# Patient Record
Sex: Male | Born: 1970 | Race: White | Hispanic: No | Smoking: Never smoker
Health system: Southern US, Community
[De-identification: ages and names within clinical notes are randomized; demographics above are authoritative.]

## PROBLEM LIST (undated history)

## (undated) DIAGNOSIS — G35D Multiple sclerosis, unspecified: Secondary | ICD-10-CM

## (undated) DIAGNOSIS — F419 Anxiety disorder, unspecified: Secondary | ICD-10-CM

## (undated) DIAGNOSIS — I10 Essential (primary) hypertension: Secondary | ICD-10-CM

## (undated) DIAGNOSIS — G35 Multiple sclerosis: Secondary | ICD-10-CM

## (undated) DIAGNOSIS — E119 Type 2 diabetes mellitus without complications: Secondary | ICD-10-CM

## (undated) HISTORY — PX: TOE AMPUTATION: SHX809

## (undated) HISTORY — DX: Anxiety disorder, unspecified: F41.9

## (undated) HISTORY — PX: CATARACT EXTRACTION, BILATERAL: SHX1313

---

## 2006-07-17 ENCOUNTER — Encounter (HOSPITAL_BASED_OUTPATIENT_CLINIC_OR_DEPARTMENT_OTHER): Admission: RE | Admit: 2006-07-17 | Discharge: 2006-10-15 | Payer: Self-pay | Admitting: Internal Medicine

## 2007-06-25 ENCOUNTER — Ambulatory Visit: Payer: Self-pay | Admitting: Internal Medicine

## 2007-06-25 ENCOUNTER — Encounter: Payer: Self-pay | Admitting: Internal Medicine

## 2007-07-06 ENCOUNTER — Inpatient Hospital Stay: Payer: Self-pay | Admitting: Endocrinology

## 2007-07-09 ENCOUNTER — Other Ambulatory Visit: Payer: Self-pay

## 2007-07-21 ENCOUNTER — Ambulatory Visit: Payer: Self-pay | Admitting: Podiatry

## 2007-07-26 ENCOUNTER — Encounter: Payer: Self-pay | Admitting: Internal Medicine

## 2009-01-15 IMAGING — CR DG FOOT 2V*L*
1 series · 2 of 2 positions shown · non-contrast
Comparison: none

REASON FOR EXAM: foot ulcer fax [phone]
COMMENTS:

PROCEDURE:     DXR - DXR FOOT LEFT AP AND LATERAL  - June 25, 2007  [DATE]
RESULT:     Images of the LEFT foot demonstrate degenerative spurring at the
Achilles and plantar regions of the calcaneus. No definite bony destruction
is seen.

[Series 1: view not recorded · 0.17mm/px · 2 of 2 slices shown]
[im 1/2]
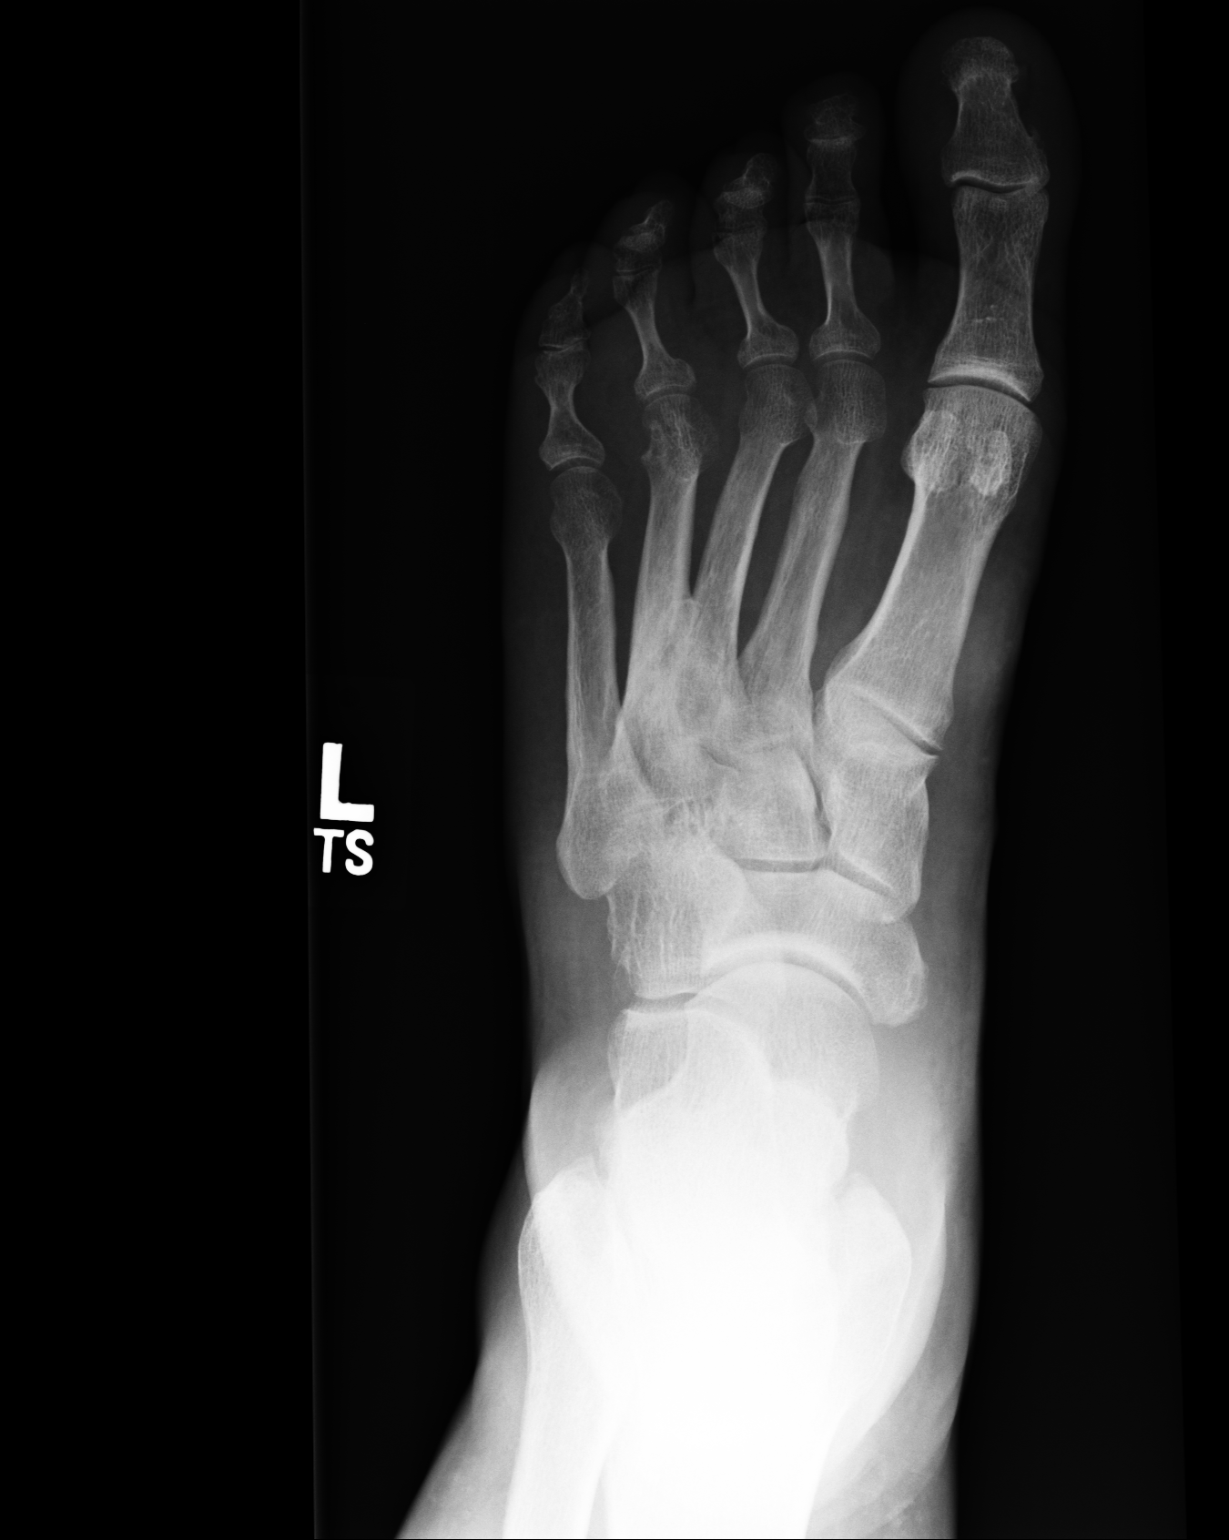
[im 2/2]
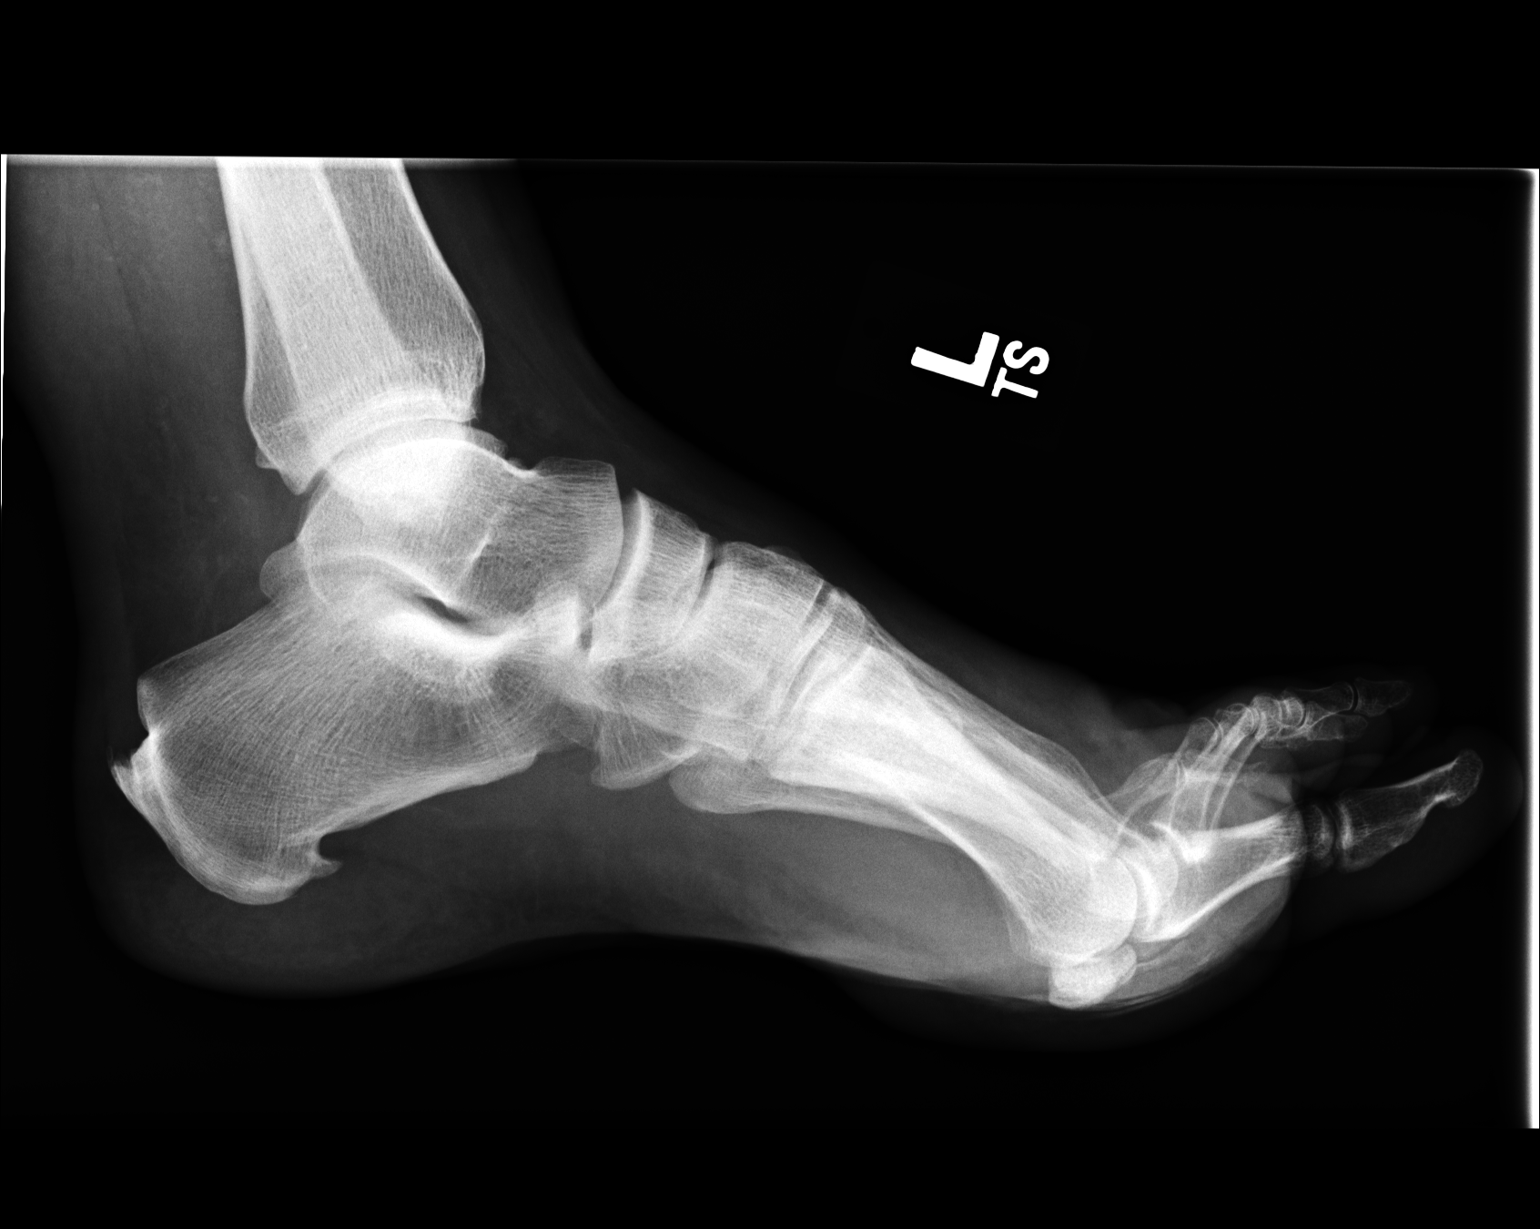

[2 of 2 positions shown; findings below may reference images not displayed]

IMPRESSION: No acute abnormality.

## 2013-06-15 ENCOUNTER — Emergency Department: Payer: Self-pay | Admitting: Emergency Medicine

## 2013-08-18 ENCOUNTER — Ambulatory Visit: Payer: Self-pay | Admitting: Internal Medicine

## 2013-08-21 ENCOUNTER — Inpatient Hospital Stay: Payer: Self-pay | Admitting: Internal Medicine

## 2013-08-21 LAB — URINALYSIS, COMPLETE
Bacteria: NONE SEEN
Bilirubin,UR: NEGATIVE
Hyaline Cast: 34
Leukocyte Esterase: NEGATIVE
Ph: 5 (ref 4.5–8.0)
Protein: 30
RBC,UR: 2 /HPF (ref 0–5)
Specific Gravity: 1.02 (ref 1.003–1.030)

## 2013-08-21 LAB — CBC
HCT: 37 % — ABNORMAL LOW (ref 40.0–52.0)
MCH: 29.2 pg (ref 26.0–34.0)
MCV: 87 fL (ref 80–100)
Platelet: 369 10*3/uL (ref 150–440)
WBC: 13.7 10*3/uL — ABNORMAL HIGH (ref 3.8–10.6)

## 2013-08-21 LAB — COMPREHENSIVE METABOLIC PANEL
Alkaline Phosphatase: 83 U/L
Anion Gap: 4 — ABNORMAL LOW (ref 7–16)
BUN: 32 mg/dL — ABNORMAL HIGH (ref 7–18)
Calcium, Total: 9.5 mg/dL (ref 8.5–10.1)
Chloride: 98 mmol/L (ref 98–107)
Co2: 29 mmol/L (ref 21–32)
Creatinine: 1.87 mg/dL — ABNORMAL HIGH (ref 0.60–1.30)
EGFR (African American): 50 — ABNORMAL LOW
EGFR (Non-African Amer.): 43 — ABNORMAL LOW
Glucose: 236 mg/dL — ABNORMAL HIGH (ref 65–99)
Osmolality: 277 (ref 275–301)
Potassium: 4.5 mmol/L (ref 3.5–5.1)
SGOT(AST): 21 U/L (ref 15–37)
SGPT (ALT): 29 U/L (ref 12–78)
Sodium: 131 mmol/L — ABNORMAL LOW (ref 136–145)

## 2013-08-21 LAB — SEDIMENTATION RATE: Erythrocyte Sed Rate: 59 mm/hr — ABNORMAL HIGH (ref 0–15)

## 2013-08-22 ENCOUNTER — Ambulatory Visit: Payer: Self-pay

## 2013-08-22 LAB — BASIC METABOLIC PANEL
Anion Gap: 4 — ABNORMAL LOW (ref 7–16)
BUN: 31 mg/dL — ABNORMAL HIGH (ref 7–18)
Calcium, Total: 9.3 mg/dL (ref 8.5–10.1)
Chloride: 99 mmol/L (ref 98–107)
Co2: 28 mmol/L (ref 21–32)
Creatinine: 1.52 mg/dL — ABNORMAL HIGH (ref 0.60–1.30)
EGFR (Non-African Amer.): 55 — ABNORMAL LOW
Glucose: 131 mg/dL — ABNORMAL HIGH (ref 65–99)
Osmolality: 271 (ref 275–301)
Sodium: 131 mmol/L — ABNORMAL LOW (ref 136–145)

## 2013-08-23 LAB — CREATININE, SERUM
Creatinine: 1.5 mg/dL — ABNORMAL HIGH (ref 0.60–1.30)
EGFR (African American): >60

## 2013-12-21 ENCOUNTER — Ambulatory Visit: Payer: Self-pay | Admitting: Ophthalmology

## 2014-03-16 ENCOUNTER — Ambulatory Visit: Payer: Self-pay | Admitting: Ophthalmology

## 2014-05-15 ENCOUNTER — Emergency Department: Payer: Self-pay | Admitting: Emergency Medicine

## 2015-01-14 NOTE — Consult Note (Signed)
Brief Consult Note: Diagnosis: bilateral venous insufficiency with LLE ULlcer.   Comments: Bilateral lower extremity edema with several day history of left leg ulceration.  Admitted with cellulitis, now on IV ABx.  Recomend continuing IV ABx and keeping legs elevated. No role for I&D at thsi time.  Pt will need a layered compression wrap at discharge [facility]), to be changed 3x/week by homehealth. He will also need an outpatient workup for venous insufficiency.  Electronic Signatures: Nada Libman (MD)  (Signed [phone] 10:27)  Authored: Brief Consult Note   Last Updated: 29-Nov-14 10:27 by Nada Libman (MD)

## 2015-01-14 NOTE — Consult Note (Signed)
Brief Consult Note: Diagnosis: bilateral chronic venous insufficiency with cellulitis, and draining abscess on right.   Patient was seen by consultant.   Comments: 84 yom w/ MO, HTN, T2DM, CRI (Cr 1.9), Dyslipidemia, and h/o L 4th toe amp 06/2007 for osteomyelitis, who has no osteomyelitis by plain films, and no DVT on R by duplex 3 days ago. WBC 14. Legs usually more swollen according to wife. 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 He needs broad spectrum IV ABx, BLE elevation (such that his ankles are at the level of his chin), and diuresis (as his CRI will allow) with only bathroom privileges. No surgical drainage is required. I have discussed the case with Dr Lorretta Harp from Vascular Surgery who will see him tomorrow and take over from a surgical consultant standpoint; therefore I have not added him as a consult on my list.  Electronic Signatures: Claude Manges (MD)  (Signed [phone] 17:38)  Authored: Brief Consult Note   Last Updated: 28-Nov-14 17:38 by Claude Manges (MD)

## 2015-01-14 NOTE — Consult Note (Signed)
PATIENT NAME:  [name], [name] MR#:  [id number] DATE OF BIRTH:  04/26/1971  DATE OF CONSULTATION:  08/22/2013  CONSULTING PHYSICIAN:  Nada Libman, MD  PRIMARY CARE PHYSICIAN:  Dr. Marguerite Olea   CARDIOLOGIST: Dr. Gwen Pounds  DATE OF ADMISSION:  08/21/2013  REASON FOR CONSULTATION:  Lower extremity edema and abscess.  HISTORY OF PRESENT ILLNESS:   This is a 44 year old gentleman who describes having chronic bilateral lower extremity edema.  His edema is exacerbated by prolonged periods of standing.  Within the past few days, he has developed drainage and open wounds.  These were initially treated with a compression wrap.  He presented to the Emergency Department with drainage and redness.  He was admitted for systemic inflammatory response.  He has recently undergone an ultrasound of the lower extremity which was negative for DVT.  He has received IV vancomycin and Augmentin.    The patient is medically managed for hypercholesterolemia with a statin.  He is a type 2 diabetic which is treated with insulin.  His blood sugars currently are under better control but they have been in the 300 range in the recent past.  He also suffers from chronic renal insufficiency.  His hypertension is managed with an ACE inhibitor.  FAMILY HISTORY:  Positive for coronary artery disease in his mother.  Positive for diabetes in his mother and father.  Also positive for hypertension.    SOCIAL HISTORY:  The patient lives at home.  He is a nonsmoker.    Review of Systems: see admission H`P, no changes  PHYSICAL EXAMINATION: VITAL SIGNS: On admission:  Afebrile.  Heart rate 92, blood pressure 116/72, O2 sats are 98% on room air.   HEENT:  Pupils are equal and round.  Normocephalic, atraumatic. LUNGS:  Nonlabored respirations. CARDIOVASCULAR: Regular rate and rhythm.  Palpable dorsalis pedis pulses bilaterally.  1+ edema in both lower extremities.   ABDOMEN:  Soft and nontender.   NEUROLOGIC:  Grossly intact.    Psych:  alert and oriented, normal affect Skin: Bilateral edema up to the knees.  He has hyperpigmentation of bilateral medial calves.  There are 2 open areas on the right lower extremity with drainage.   PSYCHIATRIC:  The patient is appropriate, alert and oriented.    LABORATORY DATA:  White blood cell count is 13.7, creatinine is 1.8.  ASSESSMENT:  Right lower extremity cellulitis with bilateral edema.  PLAN:  The patient needs to continue IV antibiotics and leg elevation to help with his swelling.  Once the erythema improves and the patient is stable for discharge, I would recommend a layered compression wrap to the right leg Roland Rack boot).  This will need to be changed at least 3 times a week by home health nurses.  As an outpatient, the patient can undergo a venous insufficiency workup.  He most likely has venous insufficiency given to varicosities on the anterior thigh.  If he does have superficial reflux, he may benefit from correction of his axial reflux.  I stress the importance of proper management of his diabetes as this is likely the main culprit for all of his health issues.  I also discussed the importance of compression stockings.  These will need to be worn most likely lifelong.  We will continue to follow the patient while he remains in the hospital.       ____________________________ Nada Libman, MD vwb:dp D: 08/22/2013 10:53:42 ET T: 08/22/2013 11:20:16 ET JOB#: [id number]  cc: Nada Libman, MD, <Dictator> [facility]  Janae Bridgeman MD ELECTRONICALLY SIGNED 08/23/2013 8:38

## 2015-01-14 NOTE — Discharge Summary (Signed)
PATIENT NAME:  [name], [name] MR#:  [id number] DATE OF BIRTH:  12-Sep-1971  DATE OF ADMISSION:  08/21/2013 DATE OF DISCHARGE:  08/23/2013  ADMISSION/DISCHARGE DIAGNOSIS:  1.  MRSA Right leg cellulitis with ulcer.  2.  History of diabetes.  3.  Chronic kidney disease.    CONSULTATIONS: Vascular surgery.   PERTINENT LABORATORY AT DISCHARGE: Sodium 131, potassium 4.6, chloride 99, bicarb 28, BUN 31, creatinine 1.52, glucose is 131. Wound culture is staph aureus.   HOSPITAL COURSE: This is a 44 year old male with diabetes and chronic venous stasis who presented with cellulitis of the leg with an ulcer. For further details, please refer to the H and P.  1.  Right lower extremity cellulitis with an ulcer. The patient was seen by vascular surgery. There is no indication for an I and D at this time. Cultures were growing out MRSA. The patient was initially on vancomycin and Augmentin and this was changed to clindamycin at discharge. Vascular surgery recommended home with home health care and an Radio broadcast assistant. The patient will need daily dressing changes with Vaseline gauze and Kerlix.  2.  Diabetes. The patient was continued on outpatient medications.  3.  Hypertension. The patient was continued on lisinopril/HCTZ. His blood pressure was controlled.  4.  Hyperlipidemia. On a statin.  5.  Chronic kidney disease stage 2.  His creatinine remained stable at 1.5.   DISCHARGE MEDICATIONS:  1.  Clindamycin 300 mg p.o. q.8 hours x 12 days.  2.  Glipizide 5 mg b.i.d.  3.  Novolin 70/30, 35 units in a.m. and 35 units at night.  4.  HCTZ/lisinopril 25/20 daily.  5.  Simvastatin 20 mg daily.  6.  Percocet 5/325 q.6 hours p.r.n. pain, #30.   DISCHARGE HOME HEALTH: With nurse and nurse aide with Roland Rack boot and wound dressing changes.   DISCHARGE DIET: Low sodium, ADA diet.   DISCHARGE ACTIVITY: As tolerated.   DISCHARGE FOLLOWUP: The patient will follow up with Dr. Gilda Crease, [facility],  in 1 week.   TIME SPENT: 35 minutes.   The patient is medically stable for discharge.   ____________________________ Shalice Woodring P. Juliene Pina, MD spm:cs D: 08/23/2013 13:39:29 ET T: 08/23/2013 20:03:19 ET JOB#: [id number]  cc: Shiara Mcgough P. Juliene Pina, MD, <Dictator> Renford Dills, MD Janyth Contes Laurann Mcmorris MD ELECTRONICALLY SIGNED 08/24/2013 12:51

## 2015-01-14 NOTE — H&P (Signed)
PATIENT NAME:  [name], [name] MR#:  [id number] DATE OF BIRTH:  06/04/1971  DATE OF ADMISSION:  08/21/2013  PRIMARY CARE PHYSICIAN:  Dr. Marguerite Olea.   CARDIOLOGIST:  Dr. Gwen Pounds.  PRESENTING COMPLAINT: Right lower extremity cellulitis with draining small abscesses.   Mr. Lammert is an obese 44 year old Caucasian gentleman with history of diabetes, hypertension, hyperlipidemia, CKD stage III, who has chronic lower extremity edema 1 to 2+ with chronic venous insufficiency, comes to the Emergency Room after he was noted to have right tibial shin lesions, which were draining pus for the last couple of days. The patient states the pain increased. He did not have any fever at home; however, he did say the lesions popped up and are draining puss. He underwent ultrasound of the Doppler lower extremity, which is negative for DVT and was seen by Dr. Anda Kraft in the Emergency Room, who has discussed with Dr. Gilda Crease to see the patient for possible underlying chronic venous insufficiency. The patient received a dose of IV vancomycin. We will add Augmentin to cover polymicrobial organisms in the setting of type 2 diabetes.   PAST MEDICAL HISTORY: 1.  Hypertension.  2.  Chronic hyperlipidemia.  3.  Chronic kidney disease. 4.  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   ALLERGIES: No known drug allergies.   MEDICATIONS: 1.  Simvastatin 20 mg daily.  2.  Lasix 40 mg daily.  3.  NovoLog 70/30, 35 units in the morning, 35 units in the evening.  4.  Protonix 40 mg daily.  5.  Hydrocodone/acetaminophen 5/325, 1 every 4 hours as needed.  6.  Lisinopril/hydrochlorothiazide 20/25, 1 tablet once a day.  7.  Glimepiride 2 mg 1 tablet daily.   FAMILY HISTORY:  Mother died of massive heart attack, had diabetes. Father had amputation due to diabetes, high blood pressure and heart issues.     SOCIAL HISTORY: Lives at home. Nonsmoker, nonalcoholic.   REVIEW OF SYSTEMS:   CONSTITUTIONAL: No fever. Positive for fatigue and no shortness of breath. Positive for pain in the right leg.  EYES: No blurred or double vision, glaucoma or cataracts.  EARS, NOSE, THROAT: No tinnitus, ear pain, hearing loss, difficulty swallowing.  RESPIRATORY: No cough, wheeze, hemoptysis, dyspnea.  CARDIOVASCULAR: No chest pain, orthopnea. Positive for hypertension. No arrhythmia.  GASTROINTESTINAL: No nausea, vomiting, diarrhea or hematemesis or GERD.  GENITOURINARY:  No dysuria, hematuria, frequency or incontinence.  ENDOCRINE: No polyuria, nocturia or thyroid problems.  HEMATOLOGY: No anemia or easy bruising or bleeding.  SKIN:  The patient does have chronic venous stasis changes with dark purplish hue, changes in both the lower extremities on the tibia shins, along with on the right lower extremity has 3 lesions that have burst, open, small abscesses draining pus.  MUSCULOSKELETAL: Positive for arthritis. No swelling or gout.  NEUROLOGIC: No CVA, TIA, dementia, headache or memory loss.  PSYCHIATRIC: No anxiety, depression or bipolar disorder. All other systems reviewed are negative.   PHYSICAL EXAMINATION: GENERAL: The patient is awake, alert, oriented x 3, not in acute distress.  VITAL SIGNS: Afebrile. Pulse is 92. Blood pressure is 116/72. Sats are 98% on room air.  HEENT: Atraumatic, normocephalic. Pupils: PERRLA. EOM intact. Oral mucosa is moist.  NECK: Supple. No JVD. No carotid bruit.  LUNGS: Clear to auscultation bilaterally. No rales, rhonchi, respiratory distress or labored breathing.  CARDIOVASCULAR: Both heart sounds are normal. Rate, rhythm regular. PMI not lateralized. Chest is nontender.  Good pedal pulses,  good femoral pulses.   ABDOMEN: Soft, benign, nontender. No organomegaly. Positive bowel sounds.  NEUROLOGIC: Grossly intact cranial nerves II through XII. No motor or sensory deficit.  EXTREMITIES: The patient has bilateral venous insufficiency with  hyperpigmentation. Right lower extremity: He has 3 lesions which are draining pus. There is some minimal cellulitis present, however, more of findings are suggestive of chronic venous insufficiency with dark discoloration in the lower extremities. The patient does have some edema which is chronic. Good pedal pulses, good femoral pulses. He has pitting edema 1 to 2+ in both the lower extremities.  NEUROLOGIC: Grossly intact cranial nerves II through XII.  No motor or sensory deficit.  PSYCHIATRIC: The patient is awake, alert, oriented x 3.   Lactic acid 1.3. TIBIA-FIBULA: No radiographic evidence of osteomyelitis. Glucose is 236. BUN is 32. Creatinine is 1.8. Sodium is 131. Albumin is 3.2. White count is 13.7. H and H is 12.4 and 37.0.   Ultrasound of the Doppler lower extremity is negative for any DVT.     A 44 year old [name] comes in with increasing pain and swelling along with discharge from the lesions in his right lower extremity. He is being admitted with:  1.  Systemic inflammatory response syndrome secondary to right lower extremity cellulitis with some possible venous ulcers secondary to suspected chronic venous insufficiency given chronic leg edema in both lower extremities and hyperpigmentation/discoloration, which appears chronic. We will admit the patient on to surgical floor. The patient was seen by Dr. Anda Kraft,  recommends IV antibiotics and followup with vascular. He has informed Dr. Gilda Crease of the patient being admitted. We will continue IV vancomycin and add p.o. Augmentin for broad-spectrum coverage. Send wound cultures. Follow blood cultures.  2.  Type 2 diabetes. We will place the patient on sliding scale insulin and continue his insulin 70/30.  3.  Hypertension. Continue hydrochlorothiazide, lisinopril.  4.  Hyperlipidemia, on simvastatin.  5.  Morbid obesity.  6.  Deep vein thrombosis prophylaxis with subQ heparin.   Further workup will depend on the patient's clinical  course. Hospital admission plan was discussed with the patient. No family members present in the Emergency Room.   TIME SPENT: 50 minutes.     ____________________________ Wylie Hail Allena Katz, MD sap:dmm D: 08/21/2013 18:24:00 ET T: 08/21/2013 19:46:31 ET JOB#: [id number]  cc: Rehan Holness A. Allena Katz, MD, <Dictator> [facility]. Marguerite Olea, MD Willow Ora MD ELECTRONICALLY SIGNED 09/04/2013 11:06

## 2015-04-06 ENCOUNTER — Other Ambulatory Visit: Payer: Self-pay | Admitting: Nurse Practitioner

## 2015-04-06 DIAGNOSIS — M545 Low back pain, unspecified: Secondary | ICD-10-CM

## 2015-04-06 DIAGNOSIS — Z6841 Body Mass Index (BMI) 40.0 and over, adult: Secondary | ICD-10-CM

## 2015-04-13 ENCOUNTER — Ambulatory Visit: Admission: RE | Admit: 2015-04-13 | Payer: BLUE CROSS/BLUE SHIELD | Source: Ambulatory Visit

## 2015-06-29 ENCOUNTER — Other Ambulatory Visit: Payer: Self-pay | Admitting: Nurse Practitioner

## 2015-06-29 ENCOUNTER — Ambulatory Visit
Admission: RE | Admit: 2015-06-29 | Discharge: 2015-06-29 | Disposition: A | Discharge status: Home / Self Care | Payer: BLUE CROSS/BLUE SHIELD | Source: Ambulatory Visit | Attending: Nurse Practitioner | Admitting: Nurse Practitioner

## 2015-06-29 DIAGNOSIS — M7989 Other specified soft tissue disorders: Secondary | ICD-10-CM

## 2015-06-29 DIAGNOSIS — M79662 Pain in left lower leg: Secondary | ICD-10-CM

## 2015-06-29 DIAGNOSIS — R590 Localized enlarged lymph nodes: Secondary | ICD-10-CM | POA: Insufficient documentation

## 2016-07-19 ENCOUNTER — Encounter: Payer: BLUE CROSS/BLUE SHIELD | Attending: Surgery | Admitting: Surgery

## 2016-07-19 ENCOUNTER — Ambulatory Visit
Admission: RE | Admit: 2016-07-19 | Discharge: 2016-07-19 | Disposition: A | Discharge status: Home / Self Care | Payer: BLUE CROSS/BLUE SHIELD | Source: Ambulatory Visit | Attending: Surgery | Admitting: Surgery

## 2016-07-19 ENCOUNTER — Other Ambulatory Visit: Payer: Self-pay | Admitting: Surgery

## 2016-07-19 DIAGNOSIS — F419 Anxiety disorder, unspecified: Secondary | ICD-10-CM | POA: Insufficient documentation

## 2016-07-19 DIAGNOSIS — N183 Chronic kidney disease, stage 3 (moderate): Secondary | ICD-10-CM | POA: Diagnosis not present

## 2016-07-19 DIAGNOSIS — M7732 Calcaneal spur, left foot: Secondary | ICD-10-CM | POA: Insufficient documentation

## 2016-07-19 DIAGNOSIS — E119 Type 2 diabetes mellitus without complications: Secondary | ICD-10-CM | POA: Diagnosis not present

## 2016-07-19 DIAGNOSIS — L97522 Non-pressure chronic ulcer of other part of left foot with fat layer exposed: Secondary | ICD-10-CM | POA: Diagnosis not present

## 2016-07-19 DIAGNOSIS — E11621 Type 2 diabetes mellitus with foot ulcer: Secondary | ICD-10-CM | POA: Diagnosis not present

## 2016-07-19 DIAGNOSIS — S81802A Unspecified open wound, left lower leg, initial encounter: Secondary | ICD-10-CM

## 2016-07-19 DIAGNOSIS — E1142 Type 2 diabetes mellitus with diabetic polyneuropathy: Secondary | ICD-10-CM | POA: Insufficient documentation

## 2016-07-19 DIAGNOSIS — E1122 Type 2 diabetes mellitus with diabetic chronic kidney disease: Secondary | ICD-10-CM | POA: Insufficient documentation

## 2016-07-19 DIAGNOSIS — Z992 Dependence on renal dialysis: Secondary | ICD-10-CM | POA: Insufficient documentation

## 2016-07-19 DIAGNOSIS — I129 Hypertensive chronic kidney disease with stage 1 through stage 4 chronic kidney disease, or unspecified chronic kidney disease: Secondary | ICD-10-CM | POA: Insufficient documentation

## 2016-07-19 DIAGNOSIS — I87302 Chronic venous hypertension (idiopathic) without complications of left lower extremity: Secondary | ICD-10-CM | POA: Insufficient documentation

## 2016-07-19 DIAGNOSIS — E663 Overweight: Secondary | ICD-10-CM | POA: Insufficient documentation

## 2016-07-19 DIAGNOSIS — X58XXXA Exposure to other specified factors, initial encounter: Secondary | ICD-10-CM | POA: Insufficient documentation

## 2016-07-19 DIAGNOSIS — Z6841 Body Mass Index (BMI) 40.0 and over, adult: Secondary | ICD-10-CM | POA: Insufficient documentation

## 2016-07-19 DIAGNOSIS — M869 Osteomyelitis, unspecified: Secondary | ICD-10-CM

## 2016-07-19 DIAGNOSIS — E1165 Type 2 diabetes mellitus with hyperglycemia: Secondary | ICD-10-CM | POA: Diagnosis not present

## 2016-07-19 DIAGNOSIS — Z794 Long term (current) use of insulin: Secondary | ICD-10-CM | POA: Insufficient documentation

## 2016-07-19 DIAGNOSIS — Z89422 Acquired absence of other left toe(s): Secondary | ICD-10-CM | POA: Insufficient documentation

## 2016-07-20 NOTE — Progress Notes (Signed)
URIAH, PHILIPSON ([id number]) Visit Report for 07/19/2016 Chief Complaint Document Details Patient Name: [name], [name]. Date of Service: 07/19/2016 8:00 AM Medical Record Number: [id number] Patient Account Number: [id number] Date of Birth/Sex: [birth year] (45 y.o. Male) Treating RN: Curtis Sites Primary Care Physician: Polo Riley Other Clinician: Referring Physician: CLINE, TODD Treating Physician/Extender: [name] in Treatment: 0 Information Obtained from: Patient Chief Complaint Patients presents for treatment of an open diabetic ulcer the left plantar foot which she's had for about 2 months Electronic Signature(s) Signed: 07/19/2016 8:59:57 AM By: Evlyn Kanner MD, FACS Entered By: Evlyn Kanner on 07/19/2016 08:59:56 Limehouse, [name] ([id number]) -------------------------------------------------------------------------------- HPI Details Patient Name: [name]. Date of Service: 07/19/2016 8:00 AM Medical Record Number: [id number] Patient Account Number: [id number] Date of Birth/Sex: 18-Mar-1971 (45 y.o. Male) Treating RN: Curtis Sites Primary Care Physician: Polo Riley Other Clinician: Referring Physician: CLINE, TODD Treating Physician/Extender: [name] in Treatment: 0 History of Present Illness Location: left plantar foot Quality: Patient reports No Pain. Severity: Patient states wound (s) are getting better. Duration: Patient has had the wound for > 2 months prior to seeking treatment at the wound center Context: The wound would happen gradually Modifying Factors: Other treatment(s) tried include:in seeing a podiatrist who is debrided the wound and also put him on Augmentin HPI Description: 45 year old gentleman known to have diabetes mellitus cc Dr. Mickey Farber at the [facility] sees Dr. Liz Beach of podiatry for a left foot ulcer. what I understand he is not very compliant and he has hyperglycemia and is managed  with insulin.past medical history is significant for anxiety, back pain, chronic kidney disease stage III, ulcer, peripheral neuropathy, retinopathy, obesity, status post amputation of the left fourth toe,right vein stripping in January 2015. He has never been a smoker. Last hemoglobin A1c was 9.5% and repeat in August 2017 was 8.2% note he's had his left fourth toe amputated in 2009 and at that time also had received hyperbaric oxygen therapy at the wound clinic. He's had laser ablation of his right varicose vein in 2015 and this was done by Dr. Gilda Crease Electronic Signature(s) Signed: 07/19/2016 9:01:47 AM By: Evlyn Kanner MD, FACS Previous Signature: 07/19/2016 8:34:01 AM Version By: Evlyn Kanner MD, FACS Entered By: Evlyn Kanner on 07/19/2016 09:01:47 [name], [name] ([id number]) -------------------------------------------------------------------------------- Physical Exam Details Patient Name: [name]. Date of Service: 07/19/2016 8:00 AM Medical Record Number: [id number] Patient Account Number: [id number] Date of Birth/Sex: [birth year] (45 y.o. Male) Treating RN: Curtis Sites Primary Care Physician: Polo Riley Other Clinician: Referring Physician: CLINE, TODD Treating Physician/Extender: [name] in Treatment: 0 Constitutional . Pulse regular. Respirations normal and unlabored. Afebrile. . Eyes Nonicteric. Reactive to light. Ears, Nose, Mouth, and Throat Lips, teeth, and gums WNL.Marland Kitchen Moist mucosa without lesions. Neck supple and nontender. No palpable supraclavicular or cervical adenopathy. Normal sized without goiter. Respiratory WNL. No retractions.. Breath sounds WNL, No rubs, rales, rhonchi, or wheeze.. Cardiovascular Pedal Pulses WNL the left was 1.3. No clubbing, cyanosis or edema. Chest Breasts symmetical and no nipple discharge.. Breast tissue WNL, no masses, lumps, or tenderness.. Lymphatic No adneopathy. No adenopathy. No  adenopathy. Musculoskeletal Adexa without tenderness or enlargement.. Digits and nails w/o clubbing, cyanosis, infection, petechiae, ischemia, or inflammatory conditions.. Integumentary (Hair, Skin) No suspicious lesions. No crepitus or fluctuance. No peri-wound warmth or erythema. No masses.Marland Kitchen Psychiatric Judgement and insight Intact.. No evidence of depression, anxiety, or agitation.. Notes the patient has fourth toe amputation done in the past and  he has a prominent metatarsal head on the fifth toe and has a shallow ulcer with minimal callus and there is no evidence of surrounding cellulitis. No sharp debridement was required today. Electronic Signature(s) Signed: 07/19/2016 9:59:21 AM By: Evlyn Kanner MD, FACS Previous Signature: 07/19/2016 9:02:55 AM Version By: Evlyn Kanner MD, FACS Entered By: Evlyn Kanner on 07/19/2016 09:59:21 [name], [name] ([id number]) -------------------------------------------------------------------------------- Physician Orders Details Patient Name: [name]. Date of Service: 07/19/2016 8:00 AM Medical Record Number: [id number] Patient Account Number: [id number] Date of Birth/Sex: 09-May-1971 (45 y.o. Male) Treating RN: Clover Mealy, RN, BSN, [place] Primary Care Physician: Polo Riley Other Clinician: Referring Physician: CLINE, TODD Treating Physician/Extender: [name] in Treatment: 0 Verbal / Phone Orders: Yes Clinician: Afful, RN, BSN, Rita Read Back and Verified: Yes Diagnosis Coding Wound Cleansing Wound #1 Left,Plantar Metatarsal head fifth o Clean wound with Normal Saline. - In clinic o Cleanse wound with mild soap and water o May Shower, gently pat wound dry prior to applying new dressing. o May shower with protection. Primary Wound Dressing Wound #1 Left,Plantar Metatarsal head fifth o Prisma Ag Secondary Dressing Wound #1 Left,Plantar Metatarsal head fifth o Gauze and Kerlix/Conform Dressing Change  Frequency Wound #1 Left,Plantar Metatarsal head fifth o Change dressing every other day. Follow-up Appointments Wound #1 Left,Plantar Metatarsal head fifth o Return Appointment in: - On MONDAY FOR TCC APPLICATION Off-Loading Wound #1 Left,Plantar Metatarsal head fifth o Total Contact Cast to Left Lower Extremity - TO BE APPLIED ON MONDAY o Open toe surgical shoe to: - DARCO FRONT OFFLOADER FOR NOW!! Additional Orders / Instructions Wound #1 Left,Plantar Metatarsal head fifth o Increase protein intake. o Activity as tolerated o Other: - PROTEIN, VIT A, C, ZINC Radiology [name], PUSKAS. ([id number]) o X-ray, foot - 2 PLUS VIEW OF LEFT FOOT WITH FOCUS ON 5TH METATARSAL HEAD Notes GET NOTES FROM [facility] REGARDING PREVIOUS PROCEDURE Electronic Signature(s) Signed: 07/19/2016 11:57:46 AM By: Elpidio Eric BSN, RN Signed: 07/19/2016 4:19:07 PM By: Evlyn Kanner MD, FACS Entered By: Elpidio Eric on 07/19/2016 08:57:14 [name], [name] ([id number]) -------------------------------------------------------------------------------- Problem List Details Patient Name: [name]. Date of Service: 07/19/2016 8:00 AM Medical Record Number: [id number] Patient Account Number: [id number] Date of Birth/Sex: [birth year] (45 y.o. Male) Treating RN: Curtis Sites Primary Care Physician: Polo Riley Other Clinician: Referring Physician: CLINE, TODD Treating Physician/Extender: [name] in Treatment: 0 Active Problems ICD-10 Encounter Code Description Active Date Diagnosis E11.621 Type 2 diabetes mellitus with foot ulcer 07/19/2016 Yes L97.522 Non-pressure chronic ulcer of other part of left foot with fat 07/19/2016 Yes layer exposed E66.3 Overweight 07/19/2016 Yes I87.302 Chronic venous hypertension (idiopathic) without 07/19/2016 Yes complications of left lower extremity Inactive Problems Resolved Problems Electronic Signature(s) Signed: 07/19/2016 8:59:21 AM  By: Evlyn Kanner MD, FACS Entered By: Evlyn Kanner on 07/19/2016 08:59:20 [name], [name] ([id number]) -------------------------------------------------------------------------------- Progress Note Details Patient Name: [name]. Date of Service: 07/19/2016 8:00 AM Medical Record Number: [id number] Patient Account Number: [id number] Date of Birth/Sex: [birth year] (45 y.o. Male) Treating RN: Curtis Sites Primary Care Physician: Polo Riley Other Clinician: Referring Physician: CLINE, TODD Treating Physician/Extender: [name] in Treatment: 0 Subjective Chief Complaint Information obtained from Patient Patients presents for treatment of an open diabetic ulcer the left plantar foot which she's had for about 2 months History of Present Illness (HPI) The following HPI elements were documented for the patient's wound: Location: left plantar foot Quality: Patient reports No Pain. Severity: Patient states wound (s) are getting better.  Duration: Patient has had the wound for > 2 months prior to seeking treatment at the wound center Context: The wound would happen gradually Modifying Factors: Other treatment(s) tried include:in seeing a podiatrist who is debrided the wound and also put him on Augmentin 45 year old gentleman known to have diabetes mellitus cc [facility]so sees Dr. Liz Beachodd Klein of podiatry for a left foot ulcer. what I understand he is not very compliant and he has hyperglycemia and is managed with insulin.past medical history is significant for anxiety, back pain, chronic kidney disease stage III, ulcer, peripheral neuropathy, retinopathy, obesity, status post amputation of the left fourth toe,right vein stripping in January 2015. He has never been a smoker. Last hemoglobin A1c was 9.5% and repeat in August 2017 was 8.2% note he's had his left fourth toe amputated in 2009 and at that time also had received hyperbaric  oxygen therapy at the wound clinic. He's had laser ablation of his right varicose vein in 2015 and this was done by Dr. Gilda CreaseSchnier Wound History Patient presents with 1 open wound that has been present for approximately 6 months. Patient has been treating wound in the following manner: neosporin. Laboratory tests have been performed in the last month. Patient reportedly has not tested positive for an antibiotic resistant organism. Patient reportedly has tested positive for osteomyelitis. Patient reportedly has had testing performed to evaluate circulation in the legs. Patient History Information obtained from Patient. Allergies No Known Drug Allergies [name], [name] PouROBERT S. ([id number]) Social History Never smoker, Marital Status - Married, Alcohol Use - Never, Drug Use - No History, Caffeine Use - Daily. Medical History Eyes Patient has history of Cataracts - removed Cardiovascular Patient has history of Hypertension Gastrointestinal Denies history of Cirrhosis Endocrine Patient has history of Type II Diabetes Neurologic Patient has history of Neuropathy Patient is treated with Insulin. Blood sugar is tested. Medical And Surgical History Notes Cardiovascular venous insufficiency Review of Systems (ROS) Constitutional Symptoms (General Health) The patient has no complaints or symptoms. Eyes Complains or has symptoms of Glasses / Contacts - glasses. Ear/Nose/Mouth/Throat The patient has no complaints or symptoms. Hematologic/Lymphatic The patient has no complaints or symptoms. Respiratory The patient has no complaints or symptoms. Cardiovascular The patient has no complaints or symptoms. Gastrointestinal The patient has no complaints or symptoms. Endocrine The patient has no complaints or symptoms. Genitourinary Complains or has symptoms of Kidney failure/ Dialysis - CKD stage 3. Immunological The patient has no complaints or symptoms. Integumentary (Skin) The patient has no  complaints or symptoms. Musculoskeletal The patient has no complaints or symptoms. Oncologic The patient has no complaints or symptoms. Psychiatric The patient has no complaints or symptoms. [name] HaringREESE, [name] S. ([id number]) I have reviewed a list of medications and he takes paroxetine 10 mg a day, Lisinopril hydrochlorothiazide 10/12.5 one a day and 70/30 insulin 35 units in the morning and 50 units in the evening Objective Constitutional Pulse regular. Respirations normal and unlabored. Afebrile. Vitals Time Taken: 8:15 AM, Height: 71 in, Source: Stated, Weight: 298 lbs, Source: Measured, BMI: 41.6, Temperature: 97.5 F, Pulse: 83 bpm, Respiratory Rate: 16 breaths/min, Blood Pressure: 126/74 mmHg. Eyes Nonicteric. Reactive to light. Ears, Nose, Mouth, and Throat Lips, teeth, and gums WNL.Marland Kitchen. Moist mucosa without lesions. Neck supple and nontender. No palpable supraclavicular or cervical adenopathy. Normal sized without goiter. Respiratory WNL. No retractions.. Breath sounds WNL, No rubs, rales, rhonchi, or wheeze.. Cardiovascular Pedal Pulses WNL the left was 1.3. No clubbing, cyanosis or edema. Chest  Breasts symmetical and no nipple discharge.. Breast tissue WNL, no masses, lumps, or tenderness.. Lymphatic No adneopathy. No adenopathy. No adenopathy. Musculoskeletal Adexa without tenderness or enlargement.. Digits and nails w/o clubbing, cyanosis, infection, petechiae, ischemia, or inflammatory conditions.Marland Kitchen Psychiatric Judgement and insight Intact.. No evidence of depression, anxiety, or agitation.Marland Kitchen [name], [name] ([id number]) General Notes: the patient has fourth toe amputation done in the past and he has a prominent metatarsal head on the fifth toe and has a shallow ulcer with minimal callus and there is no evidence of surrounding cellulitis. No sharp debridement was required today. Integumentary (Hair, Skin) No suspicious lesions. No crepitus or fluctuance. No peri-wound  warmth or erythema. No masses.. Wound #1 status is Open. Original cause of wound was Gradually Appeared. The wound is located on the Left,Plantar Metatarsal head fifth. The wound measures 0.3cm length x 0.5cm width x 0.1cm depth; 0.118cm^2 area and 0.012cm^3 volume. The wound is limited to skin breakdown. There is no tunneling or undermining noted. There is a large amount of serous drainage noted. The wound margin is flat and intact. There is large (67-100%) red granulation within the wound bed. There is no necrotic tissue within the wound bed. The periwound skin appearance exhibited: Callus, Moist. The periwound skin appearance did not exhibit: Crepitus, Excoriation, Fluctuance, Friable, Induration, Localized Edema, Rash, Scarring, Dry/Scaly, Maceration, Atrophie Blanche, Cyanosis, Ecchymosis, Hemosiderin Staining, Mottled, Pallor, Rubor, Erythema. Periwound temperature was noted as No Abnormality. Assessment Active Problems ICD-10 E11.621 - Type 2 diabetes mellitus with foot ulcer L97.522 - Non-pressure chronic ulcer of other part of left foot with fat layer exposed E66.3 - Overweight I87.302 - Chronic venous hypertension (idiopathic) without complications of left lower extremity 45 year old diabetic who has hypoglycemia is trying his best to control his blood sugar appropriately. Has a Wagner stage I diabetic foot ulcer on the plantar aspect of his left foot and I have recommended: 1. Off loading with a total contact cast and he will be back on Monday to have his first application. 2. [employer] offloading felt and continue with his dark or front offloading shoe until the TCC is applied on Monday 3. Xray of the left foot 4. good control of his diabetes mellitus 5. also reviewed his notes from the vascular office 6. regular visits the wound center until resolution of his problem. he and his wife have read all questions answered. [name], [name] ([id number]) Plan Wound Cleansing: Wound  #1 Left,Plantar Metatarsal head fifth: Clean wound with Normal Saline. - In clinic Cleanse wound with mild soap and water May Shower, gently pat wound dry prior to applying new dressing. May shower with protection. Primary Wound Dressing: Wound #1 Left,Plantar Metatarsal head fifth: Prisma Ag Secondary Dressing: Wound #1 Left,Plantar Metatarsal head fifth: Gauze and Kerlix/Conform Dressing Change Frequency: Wound #1 Left,Plantar Metatarsal head fifth: Change dressing every other day. Follow-up Appointments: Wound #1 Left,Plantar Metatarsal head fifth: Return Appointment in: - On MONDAY FOR TCC APPLICATION Off-Loading: Wound #1 Left,Plantar Metatarsal head fifth: Total Contact Cast to Left Lower Extremity - TO BE APPLIED ON MONDAY Open toe surgical shoe to: - DARCO FRONT OFFLOADER FOR NOW!! Additional Orders / Instructions: Wound #1 Left,Plantar Metatarsal head fifth: Increase protein intake. Activity as tolerated Other: - PROTEIN, VIT A, C, ZINC Radiology ordered were: X-ray, foot - 2 PLUS VIEW OF LEFT FOOT WITH FOCUS ON 5TH METATARSAL HEAD General Notes: GET NOTES FROM [facility] REGARDING PREVIOUS PROCEDURE 45 year old diabetic who has hypoglycemia is trying his best to control his blood sugar  appropriately. Has a Wagner stage I diabetic foot ulcer on the plantar aspect of his left foot and I have recommended: 1. Off loading with a total contact cast and he will be back on Monday to have his first application. 2. [employer] offloading felt and continue with his dark or front offloading shoe until the TCC is applied on Monday 3. Xray of the left foot 4. good control of his diabetes mellitus 5. also review his notes from the vascular office 6. regular visits the wound center until resolution of his problem. YACINE, GARRIGA ([id number]) he and his wife have read all questions answered. Electronic Signature(s) Signed: 07/19/2016 10:00:13 AM By: Evlyn Kanner MD, FACS Previous  Signature: 07/19/2016 9:07:38 AM Version By: Evlyn Kanner MD, FACS Entered By: Evlyn Kanner on 07/19/2016 10:00:12 [name] ([id number]) -------------------------------------------------------------------------------- ROS/PFSH Details Patient Name: [name]. Date of Service: 07/19/2016 8:00 AM Medical Record Number: [id number] Patient Account Number: [id number] Date of Birth/Sex: Sep 05, 1971 (45 y.o. Male) Treating RN: Curtis Sites Primary Care Physician: Polo Riley Other Clinician: Referring Physician: CLINE, TODD Treating Physician/Extender: [name] in Treatment: 0 Information Obtained From Patient Wound History Do you currently have one or more open woundso Yes How many open wounds do you currently haveo 1 Approximately how long have you had your woundso 6 months How have you been treating your wound(s) until nowo neosporin Has your wound(s) ever healed and then [name]-openedo No Have you had any lab work done in the past montho Yes Who ordered the lab work doneo PCP Have you tested positive for an antibiotic resistant organism (MRSA, VRE)o No Have you tested positive for osteomyelitis (bone infection)o Yes Date: 09/25/2007 Have you had any tests for circulation on your legso Yes Who ordered the testo PCP Where was the test doneo [facility] Eyes Complaints and Symptoms: Positive for: Glasses / Contacts - glasses Medical History: Positive for: Cataracts - removed Genitourinary Complaints and Symptoms: Positive for: Kidney failure/ Dialysis - CKD stage 3 Constitutional Symptoms (General Health) Complaints and Symptoms: No Complaints or Symptoms Ear/Nose/Mouth/Throat Complaints and Symptoms: No Complaints or Symptoms Hematologic/Lymphatic AKSHAT, MINEHART ([id number]) Complaints and Symptoms: No Complaints or Symptoms Respiratory Complaints and Symptoms: No Complaints or Symptoms Cardiovascular Complaints and Symptoms: No Complaints or  Symptoms Medical History: Positive for: Hypertension Past Medical History Notes: venous insufficiency Gastrointestinal Complaints and Symptoms: No Complaints or Symptoms Medical History: Negative for: Cirrhosis Endocrine Complaints and Symptoms: No Complaints or Symptoms Medical History: Positive for: Type II Diabetes Time with diabetes: 15 years Treated with: Insulin Blood sugar tested every day: Yes Tested : Immunological Complaints and Symptoms: No Complaints or Symptoms Integumentary (Skin) Complaints and Symptoms: No Complaints or Symptoms Musculoskeletal Bisson, Jeremiah S. ([id number]) Complaints and Symptoms: No Complaints or Symptoms Neurologic Medical History: Positive for: Neuropathy Oncologic Complaints and Symptoms: No Complaints or Symptoms Psychiatric Complaints and Symptoms: No Complaints or Symptoms HBO Extended History Items Eyes: Cataracts Immunizations Pneumococcal Vaccine: Received Pneumococcal Vaccination: No Immunization Notes: up to dtae Family and Social History Never smoker; Marital Status - Married; Alcohol Use: Never; Drug Use: No History; Caffeine Use: Daily; Financial Concerns: No; Food, Clothing or Shelter Needs: No; Support System Lacking: No; Transportation Concerns: No; Advanced Directives: No; Patient does not want information on Advanced Directives Physician Affirmation I have reviewed and agree with the above information. Electronic Signature(s) Signed: 07/19/2016 4:19:07 PM By: Evlyn Kanner MD, FACS Signed: 07/19/2016 5:14:40 PM By: Curtis Sites Entered By: Evlyn Kanner on 07/19/2016 09:03:19 Odwyer, Molly Maduro  Kathie Rhodes ([id number]) -------------------------------------------------------------------------------- SuperBill Details Patient Name: THEORDORE, CISNERO. Date of Service: 07/19/2016 Medical Record Number: [id number] Patient Account Number: [id number] Date of Birth/Sex: 11-Aug-1971 (45 y.o. Male) Treating RN: Curtis Sites Primary Care Physician: Polo Riley Other Clinician: Referring Physician: CLINE, TODD Treating Physician/Extender: [name] in Treatment: 0 Diagnosis Coding ICD-10 Codes Code Description E11.621 Type 2 diabetes mellitus with foot ulcer L97.522 Non-pressure chronic ulcer of other part of left foot with fat layer exposed E66.3 Overweight I87.302 Chronic venous hypertension (idiopathic) without complications of left lower extremity Facility Procedures CPT4 Code: [id number] Description: 99214 - WOUND CARE VISIT-LEV 4 EST PT Modifier: Quantity: 1 Physician Procedures CPT4: Description Modifier Quantity Code [id number] - WC PHYS LEVEL 4 - NEW PT 1 ICD-10 Description Diagnosis E11.621 Type 2 diabetes mellitus with foot ulcer L97.522 Non-pressure chronic ulcer of other part of left foot with fat layer exposed E66.3  Overweight I87.302 Chronic venous hypertension (idiopathic) without complications of left lower extremity Electronic Signature(s) Signed: 07/19/2016 9:59:01 AM By: Evlyn Kanner MD, FACS Entered By: Evlyn Kanner on 07/19/2016 09:59:01

## 2016-07-20 NOTE — Progress Notes (Signed)
[name], [name] ([id number]) Visit Report for 07/19/2016 Abuse/Suicide Risk Screen Details Patient Name: [name], [name]. Date of Service: 07/19/2016 8:00 AM Medical Record Number: [id number] Patient Account Number: [id number] Date of Birth/Sex: 11-06-1970 (45 y.o. Male) Treating RN: Curtis Sites Primary Care Physician: Polo Riley Other Clinician: Referring Physician: CLINE, TODD Treating Physician/Extender: Rudene Re in Treatment: 0 Abuse/Suicide Risk Screen Items Answer ABUSE/SUICIDE RISK SCREEN: Has anyone close to you tried to hurt or harm you recentlyo No Do you feel uncomfortable with anyone in your familyo No Has anyone forced you do things that you didnot want to doo No Do you have any thoughts of harming yourselfo No Patient displays signs or symptoms of abuse and/or neglect. No Electronic Signature(s) Signed: 07/19/2016 5:14:40 PM By: Curtis Sites Entered By: Curtis Sites on 07/19/2016 08:29:39 [name] ([id number]) -------------------------------------------------------------------------------- Activities of Daily Living Details Patient Name: [name]. Date of Service: 07/19/2016 8:00 AM Medical Record Number: [id number] Patient Account Number: [id number] Date of Birth/Sex: 08/11/1971 (45 y.o. Male) Treating RN: Curtis Sites Primary Care Physician: Polo Riley Other Clinician: Referring Physician: CLINE, TODD Treating Physician/Extender: Rudene Re in Treatment: 0 Activities of Daily Living Items Answer Activities of Daily Living (Please select one for each item) Drive Automobile Completely Able Take Medications Completely Able Use Telephone Completely Able Care for Appearance Completely Able Use Toilet Completely Able Bath / Shower Completely Able Dress Self Completely Able Feed Self Completely Able Walk Completely Able Get In / Out Bed Completely Able Housework Completely Able Prepare Meals Completely  Able Handle Money Completely Able Shop for Self Completely Able Electronic Signature(s) Signed: 07/19/2016 5:14:40 PM By: Curtis Sites Entered By: Curtis Sites on 07/19/2016 08:29:55 [name] ([id number]) -------------------------------------------------------------------------------- Education Assessment Details Patient Name: [name]. Date of Service: 07/19/2016 8:00 AM Medical Record Number: [id number] Patient Account Number: [id number] Date of Birth/Sex: 1971-01-16 (45 y.o. Male) Treating RN: Curtis Sites Primary Care Physician: Polo Riley Other Clinician: Referring Physician: CLINE, TODD Treating Physician/Extender: Rudene Re in Treatment: 0 Primary Learner Assessed: Patient Learning Preferences/Education Level/Primary Language Learning Preference: Explanation, Demonstration Highest Education Level: College or Above Preferred Language: English Cognitive Barrier Assessment/Beliefs Language Barrier: No Translator Needed: No Memory Deficit: No Emotional Barrier: No Cultural/Religious Beliefs Affecting Medical No Care: Physical Barrier Assessment Impaired Vision: No Impaired Hearing: No Decreased Hand dexterity: No Knowledge/Comprehension Assessment Knowledge Level: Medium Comprehension Level: Medium Ability to understand written Medium instructions: Ability to understand verbal Medium instructions: Motivation Assessment Anxiety Level: Calm Cooperation: Cooperative Education Importance: Acknowledges Need Interest in Health Problems: Asks Questions Perception: Coherent Willingness to Engage in Self- Medium Management Activities: Readiness to Engage in Self- Medium Management Activities: Electronic Signature(s) [name] ([id number]) Signed: 07/19/2016 5:14:40 PM By: Curtis Sites Entered By: Curtis Sites on 07/19/2016 08:30:19 [name]  ([id number]) -------------------------------------------------------------------------------- Fall Risk Assessment Details Patient Name: [name]. Date of Service: 07/19/2016 8:00 AM Medical Record Number: [id number] Patient Account Number: [id number] Date of Birth/Sex: Nov 06, 1970 (45 y.o. Male) Treating RN: Curtis Sites Primary Care Physician: Polo Riley Other Clinician: Referring Physician: CLINE, TODD Treating Physician/Extender: Rudene Re in Treatment: 0 Fall Risk Assessment Items Have you had 2 or more falls in the last 12 monthso 0 No Have you had any fall that resulted in injury in the last 12 monthso 0 No FALL RISK ASSESSMENT: History of falling - immediate or within 3 months 0 No Secondary diagnosis 0 No Ambulatory aid None/bed rest/wheelchair/nurse 0 Yes Crutches/cane/walker 0 No Furniture  0 No IV Access/Saline Lock 0 No Gait/Training Normal/bed rest/immobile 0 Yes Weak 0 No Impaired 0 No Mental Status Oriented to own ability 0 Yes Electronic Signature(s) Signed: 07/19/2016 5:14:40 PM By: Curtis Sitesorthy, Joanna Entered By: Curtis Sitesorthy, Joanna on 07/19/2016 08:30:49 Graul, [name] ([id number]) -------------------------------------------------------------------------------- Foot Assessment Details Patient Name: [name] HaringEESE, [name] S. Date of Service: 07/19/2016 8:00 AM Medical Record Number: [id number] Patient Account Number: [id number] Date of Birth/Sex: [birth year] 8(45 y.o. Male) Treating RN: Curtis Sitesorthy, Joanna Primary Care Physician: Polo RileyGEIGER, PATRICIA Other Clinician: Referring Physician: CLINE, TODD Treating Physician/Extender: Rudene ReBritto, Errol Weeks in Treatment: 0 Foot Assessment Items Site Locations + = Sensation present, - = Sensation absent, C = Callus, U = Ulcer R = Redness, W = Warmth, M = Maceration, PU = Pre-ulcerative lesion F = Fissure, S = Swelling, D = Dryness Assessment Right: Left: Other Deformity: No No Prior Foot Ulcer: No No Prior  Amputation: No No Charcot Joint: No No Ambulatory Status: Ambulatory Without Help Gait: Steady Electronic Signature(s) Signed: 07/19/2016 5:14:40 PM By: Curtis Sitesorthy, Joanna Entered By: Curtis Sitesorthy, Joanna on 07/19/2016 08:31:35 [name], [name] ([id number]) -------------------------------------------------------------------------------- Nutrition Risk Assessment Details Patient Name: [name] HaringEESE, [name] S. Date of Service: 07/19/2016 8:00 AM Medical Record Number: [id number] Patient Account Number: [id number] Date of Birth/Sex: [birth year] [age] y.o. Male) Treating RN: Curtis Sitesorthy, Joanna Primary Care Physician: Polo RileyGEIGER, PATRICIA Other Clinician: Referring Physician: CLINE, TODD Treating Physician/Extender: Rudene ReBritto, Errol Weeks in Treatment: 0 Height (in): 71 Weight (lbs): 298 Body Mass Index (BMI): 41.6 Nutrition Risk Assessment Items NUTRITION RISK SCREEN: I have an illness or condition that made me change the kind and/or 0 No amount of food I eat I eat fewer than two meals per day 0 No I eat few fruits and vegetables, or milk products 0 No I have three or more drinks of beer, liquor or wine almost every day 0 No I have tooth or mouth problems that make it hard for me to eat 0 No I don't always have enough money to buy the food I need 0 No I eat alone most of the time 0 No I take three or more different prescribed or over-the-counter drugs a 1 Yes day Without wanting to, I have lost or gained 10 pounds in the last six 0 No months I am not always physically able to shop, cook and/or feed myself 0 No Nutrition Protocols Good Risk Protocol 0 No interventions needed Moderate Risk Protocol Electronic Signature(s) Signed: 07/19/2016 5:14:40 PM By: Curtis Sitesorthy, Joanna Entered By: Curtis Sitesorthy, Joanna on 07/19/2016 08:30:56

## 2016-07-20 NOTE — Progress Notes (Signed)
RODRIGUES, URBANEK ([id number]) Visit Report for 07/19/2016 Allergy List Details Patient Name: [name], [name]. Date of Service: 07/19/2016 8:00 AM Medical Record Number: [id number] Patient Account Number: [id number] Date of Birth/Sex: [birth year] (45 y.o. Male) Treating RN: Curtis Sites Primary Care Physician: Polo Riley Other Clinician: Referring Physician: CLINE, TODD Treating Physician/Extender: Rudene Re in Treatment: 0 Allergies Active Allergies No Known Drug Allergies Allergy Notes Electronic Signature(s) Signed: 07/19/2016 5:14:40 PM By: Curtis Sites Entered By: Curtis Sites on 07/19/2016 08:20:03 [name], [name] ([id number]) -------------------------------------------------------------------------------- Arrival Information Details Patient Name: [name]. Date of Service: 07/19/2016 8:00 AM Medical Record Number: [id number] Patient Account Number: [id number] Date of Birth/Sex: 06/06/1971 (45 y.o. Male) Treating RN: Curtis Sites Primary Care Physician: Polo Riley Other Clinician: Referring Physician: CLINE, TODD Treating Physician/Extender: Rudene Re in Treatment: 0 Visit Information Patient Arrived: Ambulatory Arrival Time: 08:13 Accompanied By: spouse Transfer Assistance: None Patient Identification Verified: Yes Secondary Verification Process Yes Completed: Patient Has Alerts: Yes Patient Alerts: DMII Electronic Signature(s) Signed: 07/19/2016 5:14:40 PM By: Curtis Sites Entered By: Curtis Sites on 07/19/2016 08:14:26 [name], [name] ([id number]) -------------------------------------------------------------------------------- Clinic Level of Care Assessment Details Patient Name: [name]. Date of Service: 07/19/2016 8:00 AM Medical Record Number: [id number] Patient Account Number: [id number] Date of Birth/Sex: [birth year] (45 y.o. Male) Treating RN: Clover Mealy, RN, BSN, [place] Primary Care Physician: Polo Riley Other Clinician: Referring Physician: CLINE, TODD Treating Physician/Extender: Rudene Re in Treatment: 0 Clinic Level of Care Assessment Items TOOL 2 Quantity Score []  - Use when only an EandM is performed on the INITIAL visit 0 ASSESSMENTS - Nursing Assessment / Reassessment X - General Physical Exam (combine w/ comprehensive assessment (listed just 1 20 below) when performed on new pt. evals) X - Comprehensive Assessment (HX, ROS, Risk Assessments, Wounds Hx, etc.) 1 25 ASSESSMENTS - Wound and Skin Assessment / Reassessment X - Simple Wound Assessment / Reassessment - one wound 1 5 []  - Complex Wound Assessment / Reassessment - multiple wounds 0 []  - Dermatologic / Skin Assessment (not related to wound area) 0 ASSESSMENTS - Ostomy and/or Continence Assessment and Care []  - Incontinence Assessment and Management 0 []  - Ostomy Care Assessment and Management (repouching, etc.) 0 PROCESS - Coordination of Care X - Simple Patient / Family Education for ongoing care 1 15 []  - Complex (extensive) Patient / Family Education for ongoing care 0 X - Staff obtains Chiropractor, Records, Test Results / Process Orders 1 10 []  - Staff telephones HHA, Nursing Homes / Clarify orders / etc 0 []  - Routine Transfer to another Facility (non-emergent condition) 0 []  - Routine Hospital Admission (non-emergent condition) 0 X - New Admissions / Manufacturing engineer / Ordering NPWT, Apligraf, etc. 1 15 []  - Emergency Hospital Admission (emergent condition) 0 []  - Simple Discharge Coordination 0 Laye, Oren S. ([id number]) []  - Complex (extensive) Discharge Coordination 0 PROCESS - Special Needs []  - Pediatric / Minor Patient Management 0 []  - Isolation Patient Management 0 []  - Hearing / Language / Visual special needs 0 []  - Assessment of Community assistance (transportation, D/C planning, etc.) 0 []  - Additional assistance / Altered mentation 0 []  - Support Surface(s) Assessment  (bed, cushion, seat, etc.) 0 INTERVENTIONS - Wound Cleansing / Measurement []  - Wound Imaging (photographs - any number of wounds) 0 []  - Wound Tracing (instead of photographs) 0 X - Simple Wound Measurement - one wound 1 5 []  - Complex Wound Measurement - multiple wounds 0 X - Simple Wound  Cleansing - one wound 1 5 []  - Complex Wound Cleansing - multiple wounds 0 INTERVENTIONS - Wound Dressings X - Small Wound Dressing one or multiple wounds 1 10 []  - Medium Wound Dressing one or multiple wounds 0 []  - Large Wound Dressing one or multiple wounds 0 []  - Application of Medications - injection 0 INTERVENTIONS - Miscellaneous []  - External ear exam 0 []  - Specimen Collection (cultures, biopsies, blood, body fluids, etc.) 0 []  - Specimen(s) / Culture(s) sent or taken to Lab for analysis 0 []  - Patient Transfer (multiple staff / Nurse, adultHoyer Lift / Similar devices) 0 []  - Simple Staple / Suture removal (25 or less) 0 []  - Complex Staple / Suture removal (26 or more) 0 [name], [name] S. ([id number]) []  - Hypo / Hyperglycemic Management (close monitor of Blood Glucose) 0 X - Ankle / Brachial Index (ABI) - do not check if billed separately 1 15 Has the patient been seen at the hospital within the last three years: Yes Total Score: 125 Level Of Care: New/Established - Level 4 Electronic Signature(s) Signed: 07/19/2016 11:57:46 AM By: Elpidio EricAfful, Rita BSN, RN Entered By: Elpidio EricAfful, Rita on 07/19/2016 09:19:35 [name], [name] PouOBERT S. ([id number]) -------------------------------------------------------------------------------- Encounter Discharge Information Details Patient Name: [name] HaringEESE, Maxen S. Date of Service: 07/19/2016 8:00 AM Medical Record Number: [id number] Patient Account Number: [id number] Date of Birth/Sex: 08-20-1971 [age] y.o. Male) Treating RN: Curtis Sitesorthy, Joanna Primary Care Physician: Polo RileyGEIGER, PATRICIA Other Clinician: Referring Physician: CLINE, TODD Treating Physician/Extender: Rudene ReBritto, Errol Weeks in  Treatment: 0 Encounter Discharge Information Items Discharge Pain Level: 0 Discharge Condition: Stable Ambulatory Status: Ambulatory Discharge Destination: Home Transportation: Private Auto Accompanied By: spouse Schedule Follow-up Appointment: Yes Medication Reconciliation completed and provided to Patient/Care No Shanyce Daris: Provided on Clinical Summary of Care: 07/19/2016 Form Type Recipient Paper Patient RR Electronic Signature(s) Signed: 07/19/2016 9:43:05 AM By: Curtis Sitesorthy, Joanna Previous Signature: 07/19/2016 9:13:07 AM Version By: Gwenlyn PerkingMoore, Shelia Entered By: Curtis Sitesorthy, Joanna on 07/19/2016 09:43:04 [name], [name] PouOBERT S. ([id number]) -------------------------------------------------------------------------------- Lower Extremity Assessment Details Patient Name: [name] HaringEESE, Abdulkareem S. Date of Service: 07/19/2016 8:00 AM Medical Record Number: [id number] Patient Account Number: [id number] Date of Birth/Sex: 08-20-1971 [age] y.o. Male) Treating RN: Curtis Sitesorthy, Joanna Primary Care Physician: Polo RileyGEIGER, PATRICIA Other Clinician: Referring Physician: CLINE, TODD Treating Physician/Extender: Rudene ReBritto, Errol Weeks in Treatment: 0 Edema Assessment Assessed: [Left: No] [Right: No] Edema: [Left: Ye] [Right: s] Calf Left: Right: Point of Measurement: 37 cm From Medial Instep 50.5 cm cm Ankle Left: Right: Point of Measurement: 12 cm From Medial Instep 30 cm cm Vascular Assessment Pulses: Posterior Tibial Palpable: [Left:Yes] Doppler: [Left:Monophasic] Dorsalis Pedis Palpable: [Left:Yes] Doppler: [Left:Monophasic] Extremity colors, hair growth, and conditions: Extremity Color: [Left:Hyperpigmented] Hair Growth on Extremity: [Left:Yes] Temperature of Extremity: [Left:Warm] Capillary Refill: [Left:< 3 seconds] Blood Pressure: Brachial: [Left:126] Dorsalis Pedis: 150 [Left:Dorsalis Pedis:] Ankle: Posterior Tibial: 164 [Left:Posterior Tibial: 1.30] Toe Nail Assessment Left: Right: Thick:  Yes Discolored: Yes Deformed: Yes Improper Length and Hygiene: No [name], [name] S. ([id number]) Electronic Signature(s) Signed: 07/19/2016 5:14:40 PM By: Curtis Sitesorthy, Joanna Entered By: Curtis Sitesorthy, Joanna on 07/19/2016 08:41:08 Stolz, [name] PouOBERT S. ([id number]) -------------------------------------------------------------------------------- Multi Wound Chart Details Patient Name: [name] HaringEESE, Prajwal S. Date of Service: 07/19/2016 8:00 AM Medical Record Number: [id number] Patient Account Number: [id number] Date of Birth/Sex: 08-20-1971 (45 y.o. Male) Treating RN: Clover MealyAfful, RN, BSN, [place] Primary Care Physician: Polo RileyGEIGER, PATRICIA Other Clinician: Referring Physician: CLINE, TODD Treating Physician/Extender: Rudene ReBritto, Errol Weeks in Treatment: 0 Vital Signs Height(in): 71 Pulse(bpm): 83 Weight(lbs): 298 Blood Pressure 126/74 (mmHg): Body Mass Index(BMI): 42 Temperature(F): 97.5  Respiratory Rate 16 (breaths/min): Photos: [N/A:N/A] Wound Location: Left Metatarsal head fifth - N/A N/A Plantar Wounding Event: Gradually Appeared N/A N/A Primary Etiology: Diabetic Wound/Ulcer of N/A N/A the Lower Extremity Comorbid History: Cataracts, Hypertension, N/A N/A Type II Diabetes, Neuropathy Date Acquired: 01/09/2016 N/A N/A Weeks of Treatment: 0 N/A N/A Wound Status: Open N/A N/A Pending Amputation on Yes N/A N/A Presentation: Measurements L x W x D 0.3x0.5x0.1 N/A N/A (cm) Area (cm) : 0.118 N/A N/A Volume (cm) : 0.012 N/A N/A % Reduction in Area: 0.00% N/A N/A % Reduction in Volume: 0.00% N/A N/A Classification: Grade 1 N/A N/A Exudate Amount: Large N/A N/A Exudate Type: Serous N/A N/A Exudate Color: amber N/A N/A [name], [name] S. ([id number]) Wound Margin: Flat and Intact N/A N/A Granulation Amount: Large (67-100%) N/A N/A Granulation Quality: Red N/A N/A Necrotic Amount: None Present (0%) N/A N/A Exposed Structures: Fascia: No N/A N/A Fat: No Tendon: No Muscle: No Joint: No Bone:  No Limited to Skin Breakdown Epithelialization: None N/A N/A Periwound Skin Texture: Callus: Yes N/A N/A Edema: No Excoriation: No Induration: No Crepitus: No Fluctuance: No Friable: No Rash: No Scarring: No Periwound Skin Moist: Yes N/A N/A Moisture: Maceration: No Dry/Scaly: No Periwound Skin Color: Atrophie Blanche: No N/A N/A Cyanosis: No Ecchymosis: No Erythema: No Hemosiderin Staining: No Mottled: No Pallor: No Rubor: No Temperature: No Abnormality N/A N/A Tenderness on No N/A N/A Palpation: Wound Preparation: Ulcer Cleansing: N/A N/A Rinsed/Irrigated with Saline Topical Anesthetic Applied: Other: lidocaine 4% Treatment Notes Electronic Signature(s) Signed: 07/19/2016 11:57:46 AM By: Elpidio Eric BSN, RN Entered By: Elpidio Eric on 07/19/2016 08:52:00 Bevill, GEMINI BEAUMIER ([id number]) KONNER, SAIZ ([id number]) -------------------------------------------------------------------------------- Multi-Disciplinary Care Plan Details Patient Name: [name]. Date of Service: 07/19/2016 8:00 AM Medical Record Number: [id number] Patient Account Number: [id number] Date of Birth/Sex: 1970-10-05 (45 y.o. Male) Treating RN: Clover Mealy, RN, BSN, [place] Primary Care Physician: Polo Riley Other Clinician: Referring Physician: CLINE, TODD Treating Physician/Extender: Rudene Re in Treatment: 0 Active Inactive Orientation to the Wound Care Program Nursing Diagnoses: Knowledge deficit related to the wound healing center program Goals: Patient/caregiver will verbalize understanding of the Wound Healing Center Program Date Initiated: 07/19/2016 Goal Status: Active Interventions: Provide education on orientation to the wound center Notes: Peripheral Neuropathy Nursing Diagnoses: Knowledge deficit related to disease process and management of peripheral neurovascular dysfunction Potential alteration in peripheral tissue perfusion (select prior to confirmation  of diagnosis) Goals: Patient/caregiver will verbalize understanding of disease process and disease management Date Initiated: 07/19/2016 Goal Status: Active Interventions: Assess signs and symptoms of neuropathy upon admission and as needed Provide education on Management of Neuropathy and Related Ulcers Provide education on Management of Neuropathy upon discharge from the Wound Center Screen for HBO Treatment Activities: Consult for HBO : 07/19/2016 Patient referred for customized footwear/offloading : 07/19/2016 Notes: [name], ELLER ([id number]) Venous Leg Ulcer Nursing Diagnoses: Actual venous Insuffiency (use after diagnosis is confirmed) Knowledge deficit related to disease process and management Potential for venous Insuffiency (use before diagnosis confirmed) Goals: Non-invasive venous studies are completed as ordered Date Initiated: 07/19/2016 Goal Status: Active Patient will maintain optimal edema control Date Initiated: 07/19/2016 Goal Status: Active Patient/caregiver will verbalize understanding of disease process and disease management Date Initiated: 07/19/2016 Goal Status: Active Verify adequate tissue perfusion prior to therapeutic compression application Date Initiated: 07/19/2016 Goal Status: Active Interventions: Assess peripheral edema status every visit. Provide education on venous insufficiency Treatment Activities: Non-invasive vascular studies : 07/19/2016 Notes: Wound/Skin Impairment Nursing Diagnoses: Impaired  tissue integrity Knowledge deficit related to ulceration/compromised skin integrity Goals: Patient/caregiver will verbalize understanding of skin care regimen Date Initiated: 07/19/2016 Goal Status: Active Ulcer/skin breakdown will have a volume reduction of 30% by week 4 Date Initiated: 07/19/2016 Goal Status: Active Ulcer/skin breakdown will have a volume reduction of 50% by week 8 Date Initiated: 07/19/2016 GIANLUCAS, GOVIN  ([id number]) Goal Status: Active Ulcer/skin breakdown will have a volume reduction of 80% by week 12 Date Initiated: 07/19/2016 Goal Status: Active Ulcer/skin breakdown will heal within 14 weeks Date Initiated: 07/19/2016 Goal Status: Active Interventions: Assess patient/caregiver ability to perform ulcer/skin care regimen upon admission and as needed Assess ulceration(s) every visit Provide education on ulcer and skin care Notes: Electronic Signature(s) Signed: 07/19/2016 11:57:46 AM By: Elpidio Eric BSN, RN Entered By: Elpidio Eric on 07/19/2016 08:51:12 [name], [name] ([id number]) -------------------------------------------------------------------------------- Pain Assessment Details Patient Name: [name]. Date of Service: 07/19/2016 8:00 AM Medical Record Number: [id number] Patient Account Number: [id number] Date of Birth/Sex: 1970-12-05 (45 y.o. Male) Treating RN: Curtis Sites Primary Care Physician: Polo Riley Other Clinician: Referring Physician: CLINE, TODD Treating Physician/Extender: Rudene Re in Treatment: 0 Active Problems Location of Pain Severity and Description of Pain Patient Has Paino Yes Site Locations Pain Location: Pain in Ulcers With Dressing Change: Yes Duration of the Pain. Constant / Intermittento Constant Pain Management and Medication Current Pain Management: Notes Topical or injectable lidocaine is offered to patient for acute pain when surgical debridement is performed. If needed, Patient is instructed to use over the counter pain medication for the following 24-48 hours after debridement. Wound care MDs do not prescribed pain medications. Patient has chronic pain or uncontrolled pain. Patient has been instructed to make an appointment with their Primary Care Physician for pain management. Electronic Signature(s) Signed: 07/19/2016 5:14:40 PM By: Curtis Sites Entered By: Curtis Sites on 07/19/2016 08:14:48 [name],  [name] ([id number]) -------------------------------------------------------------------------------- Patient/Caregiver Education Details Patient Name: [name] Date of Service: 07/19/2016 8:00 AM Medical Record Number: [id number] Patient Account Number: [id number] Date of Birth/Gender: 1971-06-17 (45 y.o. Male) Treating RN: Curtis Sites Primary Care Physician: Polo Riley Other Clinician: Referring Physician: CLINE, TODD Treating Physician/Extender: Rudene Re in Treatment: 0 Education Assessment Education Provided To: Patient and Caregiver Education Topics Provided Wound/Skin Impairment: Handouts: Other: wound care as ordered Methods: Demonstration, Explain/Verbal Responses: State content correctly Electronic Signature(s) Signed: 07/19/2016 5:14:40 PM By: Curtis Sites Entered By: Curtis Sites on 07/19/2016 09:43:48 [name], [name] ([id number]) -------------------------------------------------------------------------------- Wound Assessment Details Patient Name: [name]. Date of Service: 07/19/2016 8:00 AM Medical Record Number: [id number] Patient Account Number: [id number] Date of Birth/Sex: Apr 21, 1971 (45 y.o. Male) Treating RN: Curtis Sites Primary Care Physician: Polo Riley Other Clinician: Referring Physician: CLINE, TODD Treating Physician/Extender: Rudene Re in Treatment: 0 Wound Status Wound Number: 1 Primary Diabetic Wound/Ulcer of the Lower Etiology: Extremity Wound Location: Left Metatarsal head fifth - Plantar Wound Open Status: Wounding Event: Gradually Appeared Comorbid Cataracts, Hypertension, Type II Date Acquired: 01/09/2016 History: Diabetes, Neuropathy Weeks Of Treatment: 0 Clustered Wound: No Pending Amputation On Presentation Photos Wound Measurements Length: (cm) 0.3 Width: (cm) 0.5 Depth: (cm) 0.1 Area: (cm) 0.118 Volume: (cm) 0.012 % Reduction in Area: 0% % Reduction in Volume:  0% Epithelialization: None Tunneling: No Undermining: No Wound Description Classification: Grade 1 Foul Odor Aft Wound Margin: Flat and Intact Exudate Amount: Large Exudate Type: Serous Exudate Color: amber er Cleansing: No Wound Bed Granulation Amount: Large (67-100%) Exposed Structure Granulation Quality: Red Fascia Exposed:  No Necrotic Amount: None Present (0%) Fat Layer Exposed: No Vieth, Everhett S. ([id number]) Tendon Exposed: No Muscle Exposed: No Joint Exposed: No Bone Exposed: No Limited to Skin Breakdown Periwound Skin Texture Texture Color No Abnormalities Noted: No No Abnormalities Noted: No Callus: Yes Atrophie Blanche: No Crepitus: No Cyanosis: No Excoriation: No Ecchymosis: No Fluctuance: No Erythema: No Friable: No Hemosiderin Staining: No Induration: No Mottled: No Localized Edema: No Pallor: No Rash: No Rubor: No Scarring: No Temperature / Pain Moisture Temperature: No Abnormality No Abnormalities Noted: No Dry / Scaly: No Maceration: No Moist: Yes Wound Preparation Ulcer Cleansing: Rinsed/Irrigated with Saline Topical Anesthetic Applied: Other: lidocaine 4%, Treatment Notes Wound #1 (Left, Plantar Metatarsal head fifth) 1. Cleansed with: Clean wound with Normal Saline 2. Anesthetic Topical Lidocaine 4% cream to wound bed prior to debridement 4. Dressing Applied: Prisma Ag 5. Secondary Dressing Applied Dry Gauze 7. Secured with Tape Notes felt Electronic Signature(s) Signed: 07/19/2016 5:14:40 PM By: Curtis Sites Entered By: Curtis Sites on 07/19/2016 08:35:29 [name], [name] ([id number]) Blaney, [name] ([id number]) -------------------------------------------------------------------------------- Vitals Details Patient Name: [name]. Date of Service: 07/19/2016 8:00 AM Medical Record Number: [id number] Patient Account Number: [id number] Date of Birth/Sex: 06-07-1971 (45 y.o. Male) Treating RN: Curtis Sites Primary Care Physician: Polo Riley Other Clinician: Referring Physician: CLINE, TODD Treating Physician/Extender: Rudene Re in Treatment: 0 Vital Signs Time Taken: 08:15 Temperature (F): 97.5 Height (in): 71 Pulse (bpm): 83 Source: Stated Respiratory Rate (breaths/min): 16 Weight (lbs): 298 Blood Pressure (mmHg): 126/74 Source: Measured Reference Range: 80 - 120 mg / dl Body Mass Index (BMI): 41.6 Electronic Signature(s) Signed: 07/19/2016 5:14:40 PM By: Curtis Sites Entered By: Curtis Sites on 07/19/2016 08:19:13

## 2016-07-23 ENCOUNTER — Encounter: Payer: BLUE CROSS/BLUE SHIELD | Admitting: Surgery

## 2016-07-23 DIAGNOSIS — E11621 Type 2 diabetes mellitus with foot ulcer: Secondary | ICD-10-CM | POA: Diagnosis not present

## 2016-07-23 NOTE — Progress Notes (Addendum)
BRAYDENN, KNUEPPEL ([id number]) Visit Report for 07/23/2016 Chief Complaint Document Details Patient Name: [name], [name]. Date of Service: 07/23/2016 8:00 AM Medical Record Number: [id number] Patient Account Number: [id number] Date of Birth/Sex: 07-29-1971 (45 y.o. Male) Treating RN: Phillis Haggis Primary Care Physician: Polo Riley Other Clinician: Referring Physician: Polo Riley Treating Physician/Extender: Rudene Re in Treatment: 0 Information Obtained from: Patient Chief Complaint Patients presents for treatment of an open diabetic ulcer the left plantar foot which she's had for about 2 months Electronic Signature(s) Signed: 07/23/2016 8:29:17 AM By: Evlyn Kanner MD, FACS Entered By: Evlyn Kanner on 07/23/2016 08:29:17 Berkland, [name] ([id number]) -------------------------------------------------------------------------------- HPI Details Patient Name: [name]. Date of Service: 07/23/2016 8:00 AM Medical Record Number: [id number] Patient Account Number: [id number] Date of Birth/Sex: 06/03/1971 (45 y.o. Male) Treating RN: Phillis Haggis Primary Care Physician: Polo Riley Other Clinician: Referring Physician: Polo Riley Treating Physician/Extender: Rudene Re in Treatment: 0 History of Present Illness Location: left plantar foot Quality: Patient reports No Pain. Severity: Patient states wound (s) are getting better. Duration: Patient has had the wound for > 2 months prior to seeking treatment at the wound center Context: The wound would happen gradually Modifying Factors: Other treatment(s) tried include:in seeing a podiatrist who is debrided the wound and also put him on Augmentin HPI Description: 45 year old gentleman known to have diabetes mellitus cc Dr. Mickey Farber at the [facility] sees Dr. Liz Beach of podiatry for a left foot ulcer. what I understand he is not very compliant and he has hyperglycemia and  is managed with insulin.past medical history is significant for anxiety, back pain, chronic kidney disease stage III, ulcer, peripheral neuropathy, retinopathy, obesity, status post amputation of the left fourth toe,right vein stripping in January 2015. He has never been a smoker. Last hemoglobin A1c was 9.5% and repeat in August 2017 was 8.2% note he's had his left fourth toe amputated in 2009 and at that time also had received hyperbaric oxygen therapy at the wound clinic. He's had laser ablation of his right varicose vein in 2015 and this was done by Dr. Gilda Crease 07/23/2016 -- -- x-ray of the left foot -- IMPRESSION: No acute fracture or subluxation. The patient is status post amputation of fourth toe and distal aspect of fourth metatarsal. Again noted plantar and posterior spurring of calcaneus. There is dorsal spurring tarsal region. No definite evidence of bone destruction to suggest osteomyelitis. Electronic Signature(s) Signed: 07/23/2016 8:29:23 AM By: Evlyn Kanner MD, FACS Previous Signature: 07/23/2016 8:06:02 AM Version By: Evlyn Kanner MD, FACS Entered By: Evlyn Kanner on 07/23/2016 60:10:93 [name] ([id number]) -------------------------------------------------------------------------------- Physical Exam Details Patient Name: [name]. Date of Service: 07/23/2016 8:00 AM Medical Record Number: [id number] Patient Account Number: [id number] Date of Birth/Sex: [birth year] (45 y.o. Male) Treating RN: Phillis Haggis Primary Care Physician: Polo Riley Other Clinician: Referring Physician: Polo Riley Treating Physician/Extender: Rudene Re in Treatment: 0 Constitutional . Pulse regular. Respirations normal and unlabored. Afebrile. . Eyes Nonicteric. Reactive to light. Ears, Nose, Mouth, and Throat Lips, teeth, and gums WNL.Marland Kitchen Moist mucosa without lesions. Neck supple and nontender. No palpable supraclavicular or cervical adenopathy. Normal  sized without goiter. Respiratory WNL. No retractions.. Breath sounds WNL, No rubs, rales, rhonchi, or wheeze.. Cardiovascular Heart rhythm and rate regular, no murmur or gallop.. Pedal Pulses WNL. No clubbing, cyanosis or edema. Lymphatic No adneopathy. No adenopathy. No adenopathy. Musculoskeletal Adexa without tenderness or enlargement.. Digits and nails w/o clubbing, cyanosis, infection, petechiae, ischemia, or  inflammatory conditions.. Integumentary (Hair, Skin) No suspicious lesions. No crepitus or fluctuance. No peri-wound warmth or erythema. No masses.Marland Kitchen. Psychiatric Judgement and insight Intact.. No evidence of depression, anxiety, or agitation.. Notes the wound is looking very good and the surrounding callus is supple and no sharp debridement was required today. He will have the total contact cast applied. Electronic Signature(s) Signed: 07/23/2016 8:29:52 AM By: Evlyn KannerBritto, Marlyss Cissell MD, FACS Entered By: Evlyn KannerBritto, Gabrien Mentink on 07/23/2016 08:29:52 Sammon, [name] ([id number]) -------------------------------------------------------------------------------- Physician Orders Details Patient Name: [name] HaringEESE, Kery S. Date of Service: 07/23/2016 8:00 AM Medical Record Number: [id number] Patient Account Number: [id number] Date of Birth/Sex: [birth year](45 y.o. Male) Treating RN: Phillis HaggisPinkerton, Debi Primary Care Physician: Polo RileyGEIGER, PATRICIA Other Clinician: Referring Physician: Polo RileyGEIGER, PATRICIA Treating Physician/Extender: Rudene ReBritto, Natavia Sublette Weeks in Treatment: 0 Verbal / Phone Orders: Yes ClinicianAshok Cordia: Pinkerton, Debi Read Back and Verified: Yes Diagnosis Coding Wound Cleansing Wound #1 Left,Plantar Metatarsal head fifth o Clean wound with Normal Saline. - In clinic o Cleanse wound with mild soap and water o May shower with protection. o No tub bath. Anesthetic Wound #1 Left,Plantar Metatarsal head fifth o Topical Lidocaine 4% cream applied to wound bed prior to debridement Skin  Barriers/Peri-Wound Care Wound #1 Left,Plantar Metatarsal head fifth o Skin Prep Primary Wound Dressing Wound #1 Left,Plantar Metatarsal head fifth o Prisma Ag Secondary Dressing Wound #1 Left,Plantar Metatarsal head fifth o Gauze and Kerlix/Conform o Foam Dressing Change Frequency o Other: - Thursday this week Follow-up Appointments Wound #1 Left,Plantar Metatarsal head fifth o Other: - Thursday this week for cast recheck Off-Loading Wound #1 Left,Plantar Metatarsal head fifth o Total Contact Cast to Left Lower Extremity Firmin, Graylen S. ([id number]) Additional Orders / Instructions Wound #1 Left,Plantar Metatarsal head fifth o Increase protein intake. o Activity as tolerated o Other: - PROTEIN, VIT A, C, ZINC Electronic Signature(s) Signed: 07/23/2016 3:58:21 PM By: Evlyn KannerBritto, Chaze Hruska MD, FACS Signed: 07/23/2016 4:46:11 PM By: Alejandro MullingPinkerton, Debra Entered By: Alejandro MullingPinkerton, Debra on 07/23/2016 09:01:46 Bazinet, [name] ([id number]) -------------------------------------------------------------------------------- Problem List Details Patient Name: [name] HaringEESE, Vergil S. Date of Service: 07/23/2016 8:00 AM Medical Record Number: [id number] Patient Account Number: [id number] Date of Birth/Sex: [birth year] [age] y.o. 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 ([id number]) -------------------------------------------------------------------------------- Progress Note Details Patient Name: [name] HaringEESE, Tibor S. Date of Service: 07/23/2016 8:00 AM Medical Record Number: [id number] Patient Account Number: [id number] Date of Birth/Sex: [birth year] [age] y.o. Male) Treating RN: Phillis HaggisPinkerton, Debi Primary Care Physician: Polo RileyGEIGER, PATRICIA Other Clinician: Referring Physician: Polo RileyGEIGER, PATRICIA Treating Physician/Extender: Rudene ReBritto, Josalin Carneiro Weeks in Treatment: 0 Subjective Chief Complaint Information obtained from Patient Patients presents for treatment of an open diabetic ulcer the left plantar foot which she's had for about 2 months History of Present Illness (HPI) The following HPI elements were documented for the patient's wound: Location: left plantar foot Quality: Patient reports No Pain. Severity: Patient states wound (s) are getting better. Duration: Patient has had the wound for > 2 months prior to seeking treatment at  the wound center Context: The wound would happen gradually Modifying Factors: Other treatment(s) tried include:in seeing a podiatrist who is debrided the wound and also put him on Augmentin 45 year old gentleman known to have diabetes mellitus cc Dr. Mickey Farber at the [facility] sees Dr. Liz Beach of podiatry for a left foot ulcer. what I understand he is not very compliant and he has hyperglycemia and is managed with insulin.past medical history is significant for anxiety, back pain, chronic kidney disease stage III, ulcer, peripheral neuropathy, retinopathy, obesity, status post amputation of the left fourth toe,right vein stripping in January 2015. He has never been a smoker. Last hemoglobin A1c was 9.5% and repeat in August 2017 was 8.2% note he's had his left fourth toe amputated in 2009 and at that time also had received hyperbaric  oxygen therapy at the wound clinic. He's had laser ablation of his right varicose vein in 2015 and this was done by Dr. Gilda Crease 07/23/2016 -- -- x-ray of the left foot -- IMPRESSION: No acute fracture or subluxation. The patient is status post amputation of fourth toe and distal aspect of fourth metatarsal. Again noted plantar and posterior spurring of calcaneus. There is dorsal spurring tarsal region. No definite evidence of bone destruction to suggest osteomyelitis. Objective Constitutional Gallaher, Jostin S. ([id number]) Pulse regular. Respirations normal and unlabored. Afebrile. Vitals Time Taken: 8:14 AM, Height: 71 in, Weight: 298 lbs, BMI: 41.6, Temperature: 97.7 F, Pulse: 71 bpm, Respiratory Rate: 18 breaths/min, Blood Pressure: 131/74 mmHg. Eyes Nonicteric. Reactive to light. Ears, Nose, Mouth, and Throat Lips, teeth, and gums WNL.Marland Kitchen Moist mucosa without lesions. Neck supple and nontender. No palpable supraclavicular or cervical adenopathy. Normal sized without goiter. Respiratory WNL. No retractions.. Breath sounds WNL, No rubs, rales, rhonchi, or wheeze.. Cardiovascular Heart rhythm and rate regular, no murmur or gallop.. Pedal Pulses WNL. No clubbing, cyanosis or edema. Lymphatic No adneopathy. No adenopathy. No adenopathy. Musculoskeletal Adexa without tenderness or enlargement.. Digits and nails w/o clubbing, cyanosis, infection, petechiae, ischemia, or inflammatory conditions.Marland Kitchen Psychiatric Judgement and insight Intact.. No evidence of depression, anxiety, or agitation.. General Notes: the wound is looking very good and the surrounding callus is supple and no sharp debridement was required today. He will have the total contact cast applied. Integumentary (Hair, Skin) No suspicious lesions. No crepitus or fluctuance. No peri-wound warmth or erythema. No masses.. Wound #1 status is Open. Original cause of wound was Gradually Appeared. The wound is located on  the Left,Plantar Metatarsal head fifth. The wound measures 0.3cm length x 0.5cm width x 0.2cm depth; 0.118cm^2 area and 0.024cm^3 volume. The wound is limited to skin breakdown. There is no tunneling or undermining noted. There is a large amount of serous drainage noted. The wound margin is flat and intact. There is small (1-33%) red granulation within the wound bed. There is a large (67-100%) amount of necrotic tissue within the wound bed including Eschar and Adherent Slough. The periwound skin appearance exhibited: Callus, Moist. The periwound skin appearance did not exhibit: Crepitus, Excoriation, Fluctuance, Friable, Induration, Localized Edema, Rash, Scarring, Dry/Scaly, Maceration, Atrophie Blanche, Cyanosis, Ecchymosis, Hemosiderin Staining, Mottled, Pallor, Rubor, Erythema. Periwound temperature was noted as No Abnormality. RICHERD, GRIME ([id number]) Assessment Active Problems ICD-10 E11.621 - Type 2 diabetes mellitus with foot ulcer L97.522 - Non-pressure chronic ulcer of other part of left foot with fat layer exposed E66.3 - Overweight I87.302 - Chronic venous hypertension (idiopathic) without complications of left lower extremity Procedures Wound #1 Wound #1 is a Diabetic Wound/Ulcer of  the Lower Extremity located on the Left,Plantar Metatarsal head fifth . There was a Total Contact Cast Procedure by Evlyn Kanner, MD. Post procedure Diagnosis Wound #1: Same as Pre-Procedure Notes: looks clean and the callus is supple. He will have the total contact cast applied with the usual precautions and a change will be done in 48 hours.. Plan Wound Cleansing: Wound #1 Left,Plantar Metatarsal head fifth: Clean wound with Normal Saline. - In clinic Cleanse wound with mild soap and water May shower with protection. No tub bath. Anesthetic: Wound #1 Left,Plantar Metatarsal head fifth: Topical Lidocaine 4% cream applied to wound bed prior to debridement Skin Barriers/Peri-Wound  Care: Wound #1 Left,Plantar Metatarsal head fifth: Skin Prep Primary Wound Dressing: Wound #1 Left,Plantar Metatarsal head fifth: Prisma Ag Secondary Dressing: Wound #1 Left,Plantar Metatarsal head fifth: Difatta, Neftaly S. ([id number]) Gauze and Kerlix/Conform Foam Dressing Change Frequency: Other: - Thursday this week Follow-up Appointments: Wound #1 Left,Plantar Metatarsal head fifth: Other: - Thursday this week for cast recheck Off-Loading: Wound #1 Left,Plantar Metatarsal head fifth: Total Contact Cast to Left Lower Extremity Additional Orders / Instructions: Wound #1 Left,Plantar Metatarsal head fifth: Increase protein intake. Activity as tolerated Other: - PROTEIN, VIT A, C, ZINC I have recommended: 1. Off loading with a total contact cast and he will be back on Thursday to have to this changed. 2. Prisma AG offloading felt and continue. 3. Xray of the left foot --results reviewed and discussed with him 4. regular visits the wound center until resolution of his problem. Electronic Signature(s) Signed: 07/23/2016 4:03:06 PM By: Evlyn Kanner MD, FACS Previous Signature: 07/23/2016 8:32:16 AM Version By: Evlyn Kanner MD, FACS Entered By: Evlyn Kanner on 07/23/2016 16:03:06 Gottschall, [name] ([id number]) -------------------------------------------------------------------------------- Total Contact Cast Details Patient Name: [name]. Date of Service: 07/23/2016 8:00 AM Medical Record Number: [id number] Patient Account Number: [id number] Date of Birth/Sex: 1971-09-24 (45 y.o. Male) Treating RN: Phillis Haggis Primary Care Physician: Polo Riley Other Clinician: Referring Physician: Polo Riley Treating Physician/Extender: Rudene Re in Treatment: 0 Total Contact Cast Applied for Wound Wound #1 Left,Plantar Metatarsal head fifth Assessment: Performed By: Physician Evlyn Kanner, MD Post Procedure Diagnosis Same as Pre-procedure Notes looks  clean and the callus is supple. He will have the total contact cast applied with the usual precautions and a change will be done in 48 hours. Electronic Signature(s) Signed: 07/23/2016 8:29:07 AM By: Evlyn Kanner MD, FACS Entered By: Evlyn Kanner on 07/23/2016 08:29:06 Racanelli, [name] ([id number]) -------------------------------------------------------------------------------- SuperBill Details Patient Name: [name]. Date of Service: 07/23/2016 Medical Record Number: [id number] Patient Account Number: [id number] Date of Birth/Sex: [birth year] (45 y.o. Male) Treating RN: Phillis Haggis Primary Care Physician: Polo Riley Other Clinician: Referring Physician: Polo Riley Treating Physician/Extender: Rudene Re in Treatment: 0 Diagnosis Coding ICD-10 Codes Code Description E11.621 Type 2 diabetes mellitus with foot ulcer L97.522 Non-pressure chronic ulcer of other part of left foot with fat layer exposed E66.3 Overweight I87.302 Chronic venous hypertension (idiopathic) without complications of left lower extremity Facility Procedures CPT4: Description Modifier Quantity Code [id number] - APPLY TOTAL CONTACT LEG CAST 1 ICD-10 Description Diagnosis E11.621 Type 2 diabetes mellitus with foot ulcer L97.522 Non-pressure chronic ulcer of other part of left foot with fat layer exposed  I87.302 Chronic venous hypertension (idiopathic) without complications of left lower extremity Physician Procedures CPT4: Description Modifier Quantity Code [id number] - WC PHYS LEVEL 3 - EST PT 25 1 ICD-10 Description Diagnosis E11.621 Type 2 diabetes mellitus with foot ulcer L97.522  Non-pressure chronic ulcer of other part of left foot with fat layer exposed  I87.302 Chronic venous hypertension (idiopathic) without complications of left lower extremity CPT4: [id number] - WC PHYS APPLY TOTAL CONTACT CAST 1 ICD-10 Description Diagnosis E11.621 Type 2 diabetes mellitus with foot  ulcer L97.522 Non-pressure chronic ulcer of other part of left foot with fat layer exposed I87.302 MEGAN, HAYDUK  ([id number]) Electronic Signature(s) Signed: 07/23/2016 8:33:06 AM By: Evlyn Kanner MD, FACS Entered By: Evlyn Kanner on 07/23/2016 08:33:06

## 2016-07-24 NOTE — Progress Notes (Signed)
[name], [name] ([id number]) Visit Report for 07/23/2016 Arrival Information Details Patient Name: [name]. Date of Service: 07/23/2016 8:00 AM Medical Record Number: [id number] Patient Account Number: [id number] Date of Birth/Sex: 09-Jul-1971 (45 y.o. Male) Treating RN: Phillis Haggis Primary Care Physician: Polo Riley Other Clinician: Referring Physician: Polo Riley Treating Physician/Extender: Rudene Re in Treatment: 0 Visit Information History Since Last Visit All ordered tests and consults were completed: No Patient Arrived: Ambulatory Added or deleted any medications: No Arrival Time: 08:09 Any new allergies or adverse reactions: No Accompanied By: wife Had a fall or experienced change in No Transfer Assistance: None activities of daily living that may affect Patient Identification Verified: Yes risk of falls: Secondary Verification Process Yes Signs or symptoms of abuse/neglect since last No Completed: visito Patient Has Alerts: Yes Hospitalized since last visit: No Patient Alerts: DMII Pain Present Now: Yes Electronic Signature(s) Signed: 07/23/2016 4:46:11 PM By: Alejandro Mulling Entered By: Alejandro Mulling on 07/23/2016 08:11:02 [name] ([id number]) -------------------------------------------------------------------------------- Encounter Discharge Information Details Patient Name: [name]. Date of Service: 07/23/2016 8:00 AM Medical Record Number: [id number] Patient Account Number: [id number] Date of Birth/Sex: 05-Jan-1971 (45 y.o. Male) Treating RN: Phillis Haggis Primary Care Physician: Polo Riley Other Clinician: Referring Physician: Polo Riley Treating Physician/Extender: Rudene Re in Treatment: 0 Encounter Discharge Information Items Discharge Pain Level: 0 Discharge Condition: Stable Ambulatory Status: Ambulatory Discharge Destination: Home Transportation: Private  Auto Accompanied By: wife Schedule Follow-up Appointment: Yes Medication Reconciliation completed and provided to Patient/Care Yes Wanetta Funderburke: Provided on Clinical Summary of Care: 07/23/2016 Form Type Recipient Paper Patient RR Electronic Signature(s) Signed: 07/23/2016 9:03:10 AM By: Gwenlyn Perking Entered By: Gwenlyn Perking on 07/23/2016 09:03:10 [name] ([id number]) -------------------------------------------------------------------------------- Lower Extremity Assessment Details Patient Name: [name]. Date of Service: 07/23/2016 8:00 AM Medical Record Number: [id number] Patient Account Number: [id number] Date of Birth/Sex: 28-Jun-1971 (45 y.o. Male) Treating RN: Phillis Haggis Primary Care Physician: Polo Riley Other Clinician: Referring Physician: Polo Riley Treating Physician/Extender: Rudene Re in Treatment: 0 Vascular Assessment Pulses: Posterior Tibial Dorsalis Pedis Palpable: [Left:Yes] Extremity colors, hair growth, and conditions: Extremity Color: [Left:Hyperpigmented] Temperature of Extremity: [Left:Warm] Capillary Refill: [Left:< 3 seconds] Toe Nail Assessment Left: Right: Thick: Yes Discolored: Yes Deformed: Yes Improper Length and Hygiene: No Electronic Signature(s) Signed: 07/23/2016 4:46:11 PM By: Alejandro Mulling Entered By: Alejandro Mulling on 07/23/2016 08:15:11 [name] ([id number]) -------------------------------------------------------------------------------- Multi Wound Chart Details Patient Name: [name]. Date of Service: 07/23/2016 8:00 AM Medical Record Number: [id number] Patient Account Number: [id number] Date of Birth/Sex: Aug 03, 1971 (45 y.o. Male) Treating RN: Phillis Haggis Primary Care Physician: Polo Riley Other Clinician: Referring Physician: Polo Riley Treating Physician/Extender: Rudene Re in Treatment: 0 Vital Signs Height(in): 71 Pulse(bpm):  71 Weight(lbs): 298 Blood Pressure 131/74 (mmHg): Body Mass Index(BMI): 42 Temperature(F): 97.7 Respiratory Rate 18 (breaths/min): Photos: [1:No Photos] [N/A:N/A] Wound Location: [1:Left Metatarsal head fifth - N/A Plantar] Wounding Event: [1:Gradually Appeared] [N/A:N/A] Primary Etiology: [1:Diabetic Wound/Ulcer of N/A the Lower Extremity] Comorbid History: [1:Cataracts, Hypertension, N/A Type II Diabetes, Neuropathy] Date Acquired: [1:01/09/2016] [N/A:N/A] Weeks of Treatment: [1:0] [N/A:N/A] Wound Status: [1:Open] [N/A:N/A] Pending Amputation on Yes [N/A:N/A] Presentation: Measurements L x W x D 0.3x0.5x0.1 [N/A:N/A] (cm) Area (cm) : [1:0.118] [N/A:N/A] Volume (cm) : [1:0.012] [N/A:N/A] % Reduction in Area: [1:0.00%] [N/A:N/A] % Reduction in Volume: 0.00% [N/A:N/A] Classification: [1:Grade 1] [N/A:N/A] Exudate Amount: [1:Large] [N/A:N/A] Exudate Type: [1:Serous] [N/A:N/A] Exudate Color: [1:amber] [N/A:N/A] Wound Margin: [1:Flat and Intact] [N/A:N/A] Granulation Amount: [1:Small (  1-33%)] [N/A:N/A] Granulation Quality: [1:Red] [N/A:N/A] Necrotic Amount: [1:Large (67-100%)] [N/A:N/A] Necrotic Tissue: [1:Eschar, Adherent Slough N/A] Exposed Structures: [N/A:N/A] Fascia: No Fat: No Tendon: No Muscle: No Joint: No Bone: No Limited to Skin Breakdown Epithelialization: None N/A N/A Periwound Skin Texture: Callus: Yes N/A N/A Edema: No Excoriation: No Induration: No Crepitus: No Fluctuance: No Friable: No Rash: No Scarring: No Periwound Skin Moist: Yes N/A N/A Moisture: Maceration: No Dry/Scaly: No Periwound Skin Color: Atrophie Blanche: No N/A N/A Cyanosis: No Ecchymosis: No Erythema: No Hemosiderin Staining: No Mottled: No Pallor: No Rubor: No Temperature: No Abnormality N/A N/A Tenderness on No N/A N/A Palpation: Wound Preparation: Ulcer Cleansing: N/A N/A Rinsed/Irrigated with Saline Topical Anesthetic Applied: Other: lidocaine 4% Treatment  Notes Electronic Signature(s) Signed: 07/23/2016 4:46:11 PM By: Alejandro MullingPinkerton, Debra Entered By: Alejandro MullingPinkerton, Debra on 07/23/2016 08:21:09 [name], [name] ([id number]) -------------------------------------------------------------------------------- Multi-Disciplinary Care Plan Details Patient Name: [name], [name] S. Date of Service: 07/23/2016 8:00 AM Medical Record Number: [id number] Patient Account Number: [id number] Date of Birth/Sex: January 18, 1971 [age] y.o. Male) Treating RN: Phillis HaggisPinkerton, Debi Primary Care Physician: Polo RileyGEIGER, PATRICIA Other Clinician: Referring Physician: Polo RileyGEIGER, PATRICIA Treating Physician/Extender: Rudene ReBritto, Errol Weeks in Treatment: 0 Active Inactive Orientation to the Wound Care Program Nursing Diagnoses: Knowledge deficit related to the wound healing center program Goals: Patient/caregiver will verbalize understanding of the Wound Healing Center Program Date Initiated: 07/19/2016 Goal Status: Active Interventions: Provide education on orientation to the wound center Notes: Peripheral Neuropathy Nursing Diagnoses: Knowledge deficit related to disease process and management of peripheral neurovascular dysfunction Potential alteration in peripheral tissue perfusion (select prior to confirmation of diagnosis) Goals: Patient/caregiver will verbalize understanding of disease process and disease management Date Initiated: 07/19/2016 Goal Status: Active Interventions: Assess signs and symptoms of neuropathy upon admission and as needed Provide education on Management of Neuropathy and Related Ulcers Provide education on Management of Neuropathy upon discharge from the Wound Center Screen for HBO Treatment Activities: Consult for HBO : 07/19/2016 Patient referred for customized footwear/offloading : 07/19/2016 Notes: [name] HaringREESE, [name] S. ([id number]) Venous Leg Ulcer Nursing Diagnoses: Actual venous Insuffiency (use after diagnosis is confirmed) Knowledge deficit related  to disease process and management Potential for venous Insuffiency (use before diagnosis confirmed) Goals: Non-invasive venous studies are completed as ordered Date Initiated: 07/19/2016 Goal Status: Active Patient will maintain optimal edema control Date Initiated: 07/19/2016 Goal Status: Active Patient/caregiver will verbalize understanding of disease process and disease management Date Initiated: 07/19/2016 Goal Status: Active Verify adequate tissue perfusion prior to therapeutic compression application Date Initiated: 07/19/2016 Goal Status: Active Interventions: Assess peripheral edema status every visit. Provide education on venous insufficiency Treatment Activities: Non-invasive vascular studies : 07/19/2016 Notes: Wound/Skin Impairment Nursing Diagnoses: Impaired tissue integrity Knowledge deficit related to ulceration/compromised skin integrity Goals: Patient/caregiver will verbalize understanding of skin care regimen Date Initiated: 07/19/2016 Goal Status: Active Ulcer/skin breakdown will have a volume reduction of 30% by week 4 Date Initiated: 07/19/2016 Goal Status: Active Ulcer/skin breakdown will have a volume reduction of 50% by week 8 Date Initiated: 07/19/2016 [name] HaringREESE, [name] S. ([id number]) Goal Status: Active Ulcer/skin breakdown will have a volume reduction of 80% by week 12 Date Initiated: 07/19/2016 Goal Status: Active Ulcer/skin breakdown will heal within 14 weeks Date Initiated: 07/19/2016 Goal Status: Active Interventions: Assess patient/caregiver ability to perform ulcer/skin care regimen upon admission and as needed Assess ulceration(s) every visit Provide education on ulcer and skin care Notes: Electronic Signature(s) Signed: 07/23/2016 4:46:11 PM By: Alejandro MullingPinkerton, Debra Entered By: Alejandro MullingPinkerton, Debra on 07/23/2016 08:21:04 Groome, [name] ([id number]) --------------------------------------------------------------------------------  Pain  Assessment Details Patient Name: OTHO, MICHALIK. Date of Service: 07/23/2016 8:00 AM Medical Record Number: [id number] Patient Account Number: [id number] Date of Birth/Sex: 10/15/1970 (45 y.o. Male) Treating RN: Phillis Haggis Primary Care Physician: Polo Riley Other Clinician: Referring Physician: Polo Riley Treating Physician/Extender: Rudene Re in Treatment: 0 Active Problems Location of Pain Severity and Description of Pain Patient Has Paino Yes Site Locations Pain Location: Pain in Ulcers With Dressing Change: Yes Rate the pain. Current Pain Level: 3 Character of Pain Describe the Pain: Aching Pain Management and Medication Current Pain Management: Electronic Signature(s) Signed: 07/23/2016 4:46:11 PM By: Alejandro Mulling Entered By: Alejandro Mulling on 07/23/2016 08:11:17 Biggar, Les [name] ([id number]) -------------------------------------------------------------------------------- Patient/Caregiver Education Details Patient Name: [name]. Date of Service: 07/23/2016 8:00 AM Medical Record Number: [id number] Patient Account Number: [id number] Date of Birth/Gender: 04-20-1971 (45 y.o. Male) Treating RN: Phillis Haggis Primary Care Physician: Polo Riley Other Clinician: Referring Physician: Polo Riley Treating Physician/Extender: Rudene Re in Treatment: 0 Education Assessment Education Provided To: Patient Education Topics Provided Wound/Skin Impairment: Handouts: Other: do not get cast wet Methods: Demonstration, Explain/Verbal Responses: State content correctly Electronic Signature(s) Signed: 07/23/2016 4:46:11 PM By: Alejandro Mulling Entered By: Alejandro Mulling on 07/23/2016 08:59:14 Bowler, Les [name] ([id number]) -------------------------------------------------------------------------------- Wound Assessment Details Patient Name: [name]. Date of Service: 07/23/2016 8:00 AM Medical Record  Number: [id number] Patient Account Number: [id number] Date of Birth/Sex: [birth year] (45 y.o. Male) Treating RN: Phillis Haggis Primary Care Physician: Polo Riley Other Clinician: Referring Physician: Polo Riley Treating Physician/Extender: Rudene Re in Treatment: 0 Wound Status Wound Number: 1 Primary Diabetic Wound/Ulcer of the Lower Etiology: Extremity Wound Location: Left, Plantar Metatarsal head fifth Wound Open Status: Wounding Event: Gradually Appeared Comorbid Cataracts, Hypertension, Type II Date Acquired: 01/09/2016 History: Diabetes, Neuropathy Weeks Of Treatment: 0 Clustered Wound: No Pending Amputation On Presentation Photos Photo Uploaded By: Alejandro Mulling on 07/23/2016 09:06:18 Wound Measurements Length: (cm) 0.3 Width: (cm) 0.5 Depth: (cm) 0.2 Area: (cm) 0.118 Volume: (cm) 0.024 % Reduction in Area: 0% % Reduction in Volume: -100% Epithelialization: None Tunneling: No Undermining: No Wound Description Classification: Grade 1 Foul Odor Aft Wound Margin: Flat and Intact Exudate Amount: Large Exudate Type: Serous Exudate Color: amber er Cleansing: No Wound Bed Granulation Amount: Small (1-33%) Exposed Structure Granulation Quality: Red Fascia Exposed: No Kawashima, Melesio S. ([id number]) Necrotic Amount: Large (67-100%) Fat Layer Exposed: No Necrotic Quality: Eschar, Adherent Slough Tendon Exposed: No Muscle Exposed: No Joint Exposed: No Bone Exposed: No Limited to Skin Breakdown Periwound Skin Texture Texture Color No Abnormalities Noted: No No Abnormalities Noted: No Callus: Yes Atrophie Blanche: No Crepitus: No Cyanosis: No Excoriation: No Ecchymosis: No Fluctuance: No Erythema: No Friable: No Hemosiderin Staining: No Induration: No Mottled: No Localized Edema: No Pallor: No Rash: No Rubor: No Scarring: No Temperature / Pain Moisture Temperature: No Abnormality No Abnormalities Noted: No Dry / Scaly:  No Maceration: No Moist: Yes Wound Preparation Ulcer Cleansing: Rinsed/Irrigated with Saline Topical Anesthetic Applied: Other: lidocaine 4%, Treatment Notes Wound #1 (Left, Plantar Metatarsal head fifth) 1. Cleansed with: Clean wound with Normal Saline 2. Anesthetic Topical Lidocaine 4% cream to wound bed prior to debridement 3. Peri-wound Care: Skin Prep 4. Dressing Applied: Prisma Ag 5. Secondary Dressing Applied Foam Kerlix/Conform 7. Secured with Tape Notes TCC DJIBRIL, GLOGOWSKI ([id number]) Electronic Signature(s) Signed: 07/23/2016 4:46:11 PM By: Alejandro Mulling Entered By: Alejandro Mulling on 07/23/2016 08:24:21 Sedler, Les [name] ([id number]) -------------------------------------------------------------------------------- Vitals Details Patient Name: [name].  Date of Service: 07/23/2016 8:00 AM Medical Record Number: [id number] Patient Account Number: [id number] Date of Birth/Sex: 1970-10-03 (45 y.o. Male) Treating RN: Phillis Haggis Primary Care Physician: Polo Riley Other Clinician: Referring Physician: Polo Riley Treating Physician/Extender: Rudene Re in Treatment: 0 Vital Signs Time Taken: 08:14 Temperature (F): 97.7 Height (in): 71 Pulse (bpm): 71 Weight (lbs): 298 Respiratory Rate (breaths/min): 18 Body Mass Index (BMI): 41.6 Blood Pressure (mmHg): 131/74 Reference Range: 80 - 120 mg / dl Electronic Signature(s) Signed: 07/23/2016 4:46:11 PM By: Alejandro Mulling Entered By: Alejandro Mulling on 07/23/2016 08:14:52

## 2016-07-26 ENCOUNTER — Encounter: Payer: BLUE CROSS/BLUE SHIELD | Attending: Surgery | Admitting: Surgery

## 2016-07-26 DIAGNOSIS — I87302 Chronic venous hypertension (idiopathic) without complications of left lower extremity: Secondary | ICD-10-CM | POA: Insufficient documentation

## 2016-07-26 DIAGNOSIS — E1140 Type 2 diabetes mellitus with diabetic neuropathy, unspecified: Secondary | ICD-10-CM | POA: Diagnosis not present

## 2016-07-26 DIAGNOSIS — F419 Anxiety disorder, unspecified: Secondary | ICD-10-CM | POA: Insufficient documentation

## 2016-07-26 DIAGNOSIS — E663 Overweight: Secondary | ICD-10-CM | POA: Diagnosis not present

## 2016-07-26 DIAGNOSIS — N183 Chronic kidney disease, stage 3 (moderate): Secondary | ICD-10-CM | POA: Diagnosis not present

## 2016-07-26 DIAGNOSIS — E1122 Type 2 diabetes mellitus with diabetic chronic kidney disease: Secondary | ICD-10-CM | POA: Diagnosis not present

## 2016-07-26 DIAGNOSIS — Z89422 Acquired absence of other left toe(s): Secondary | ICD-10-CM | POA: Diagnosis not present

## 2016-07-26 DIAGNOSIS — E11621 Type 2 diabetes mellitus with foot ulcer: Secondary | ICD-10-CM | POA: Diagnosis present

## 2016-07-26 DIAGNOSIS — Z6841 Body Mass Index (BMI) 40.0 and over, adult: Secondary | ICD-10-CM | POA: Diagnosis not present

## 2016-07-26 DIAGNOSIS — L97522 Non-pressure chronic ulcer of other part of left foot with fat layer exposed: Secondary | ICD-10-CM | POA: Insufficient documentation

## 2016-07-27 NOTE — Progress Notes (Signed)
[name], Lenox S. ([id number]) Visit Report for 07/26/2016 Arrival Information Details Patient Name: [name], [name] S. Date of Service: 07/26/2016 10:45 AM Medical Record Number: [id number] Patient Account Number: [id number] Date of Birth/Sex: 04/16/1971 [age] y.o. Male) Treating RN: Huel CoventryWoody, Kim Primary Care Physician: Polo RileyGEIGER, PATRICIA Other Clinician: Referring Physician: Polo RileyGEIGER, PATRICIA Treating Physician/Extender: Rudene ReBritto, Errol Weeks in Treatment: 1 Visit Information History Since Last Visit Added or deleted any medications: No Patient Arrived: Ambulatory Any new allergies or adverse reactions: No Arrival Time: 10:51 Had a fall or experienced change in No Accompanied By: wife activities of daily living that may affect Transfer Assistance: None risk of falls: Patient Identification Verified: Yes Signs or symptoms of abuse/neglect since last No Secondary Verification Process Yes visito Completed: Hospitalized since last visit: No Patient Has Alerts: Yes Pain Present Now: No Patient Alerts: DMII Electronic Signature(s) Signed: 07/26/2016 5:34:48 PM By: Elliot GurneyWoody, RN, BSN, Kim RN, BSN Entered By: Elliot GurneyWoody, RN, BSN, Kim on 07/26/2016 10:52:17 Boyack, [name] ([id number]) -------------------------------------------------------------------------------- Encounter Discharge Information Details Patient Name: [name] HaringEESE, Prather S. Date of Service: 07/26/2016 10:45 AM Medical Record Number: [id number] Patient Account Number: [id number] Date of Birth/Sex: 04/16/1971 [age] y.o. Male) Treating RN: Huel CoventryWoody, Kim Primary Care Physician: Polo RileyGEIGER, PATRICIA Other Clinician: Referring Physician: Polo RileyGEIGER, PATRICIA Treating Physician/Extender: Rudene ReBritto, Errol Weeks in Treatment: 1 Encounter Discharge Information Items Discharge Pain Level: 0 Discharge Condition: Stable Ambulatory Status: Ambulatory Discharge Destination: Home Transportation: Private Auto Accompanied By: self Schedule Follow-up Appointment:  Yes Medication Reconciliation completed and provided to Patient/Care Yes Kannen Moxey: Provided on Clinical Summary of Care: 07/26/2016 Form Type Recipient Paper Patient RR Electronic Signature(s) Signed: 07/26/2016 5:34:48 PM By: Elliot GurneyWoody, RN, BSN, Kim RN, BSN Previous Signature: 07/26/2016 11:27:08 AM Version By: Gwenlyn PerkingMoore, Shelia Entered By: Elliot GurneyWoody, RN, BSN, Kim on 07/26/2016 11:31:12 Diggs, [name] ([id number]) -------------------------------------------------------------------------------- General Visit Notes Details Patient Name: [name] HaringEESE, [name] S. Date of Service: 07/26/2016 10:45 AM Medical Record Number: [id number] Patient Account Number: [id number] Date of Birth/Sex: 04/16/1971 45(45 y.o. Male) Treating RN: Huel CoventryWoody, Kim Primary Care Physician: Polo RileyGEIGER, PATRICIA Other Clinician: Referring Physician: Polo RileyGEIGER, PATRICIA Treating Physician/Extender: Rudene ReBritto, Errol Weeks in Treatment: 1 Notes Patient came in today with his TCC cast and boot in his hand. He states that on Tuesday he was able to slide his foot out of the cast. RN explained to patient the need to call the wound care center if the cast becomes loose. Patient understands and agrees. MD notified, cast was not reapplied today. Electronic Signature(s) Signed: 07/26/2016 5:34:48 PM By: Elliot GurneyWoody, RN, BSN, Kim RN, BSN Entered By: Elliot GurneyWoody, RN, BSN, Kim on 07/26/2016 11:43:56 [place], [name] ([id number]) -------------------------------------------------------------------------------- Lower Extremity Assessment Details Patient Name: [name], [name] S. Date of Service: 07/26/2016 10:45 AM Medical Record Number: [id number] Patient Account Number: [id number] Date of Birth/Sex: 04/16/1971 31(45 y.o. Male) Treating RN: Huel CoventryWoody, Kim Primary Care Physician: Polo RileyGEIGER, PATRICIA Other Clinician: Referring Physician: Polo RileyGEIGER, PATRICIA Treating Physician/Extender: Rudene ReBritto, Errol Weeks in Treatment: 1 Edema Assessment Assessed: [Left: Yes] [Right: No] Edema: [Left:  Ye] [Right: s] Calf Left: Right: Point of Measurement: 34 cm From Medial Instep 51.4 cm cm Ankle Left: Right: Point of Measurement: 12 cm From Medial Instep 30.5 cm cm Vascular Assessment Claudication: Claudication Assessment [Left:Rest Pain] Pulses: Posterior Tibial Dorsalis Pedis Palpable: [Left:Yes] Extremity colors, hair growth, and conditions: Extremity Color: [Left:Hyperpigmented] Hair Growth on Extremity: [Left:Yes] Temperature of Extremity: [Left:Warm] Capillary Refill: [Left:< 3 seconds] Dependent Rubor: [Left:No] Blanched when Elevated: [Left:No] Lipodermatosclerosis: [Left:No] Toe Nail Assessment Left: Right: Thick: Yes Discolored: Yes Deformed: Yes Improper Length  and Hygiene: No JAYLYNN, HOLME ([id number]) Electronic Signature(s) Signed: 07/26/2016 5:34:48 PM By: Elliot Gurney, RN, BSN, Kim RN, BSN Entered By: Elliot Gurney, RN, BSN, Kim on 07/26/2016 10:55:58 Starlin, [name] ([id number]) -------------------------------------------------------------------------------- Multi Wound Chart Details Patient Name: [name]. Date of Service: 07/26/2016 10:45 AM Medical Record Number: [id number] Patient Account Number: [id number] Date of Birth/Sex: May 03, 1971 (45 y.o. Male) Treating RN: Huel Coventry Primary Care Physician: Polo Riley Other Clinician: Referring Physician: Polo Riley Treating Physician/Extender: Rudene Re in Treatment: 1 Vital Signs Height(in): 71 Pulse(bpm): 86 Weight(lbs): 298 Blood Pressure 145/79 (mmHg): Body Mass Index(BMI): 42 Temperature(F): 97.5 Respiratory Rate 16 (breaths/min): Photos: [N/A:N/A] Wound Location: Left Metatarsal head fifth - N/A N/A Plantar Wounding Event: Gradually Appeared N/A N/A Primary Etiology: Diabetic Wound/Ulcer of N/A N/A the Lower Extremity Comorbid History: Cataracts, Hypertension, N/A N/A Type II Diabetes, Neuropathy Date Acquired: 01/09/2016 N/A N/A Weeks of Treatment: 1 N/A  N/A Wound Status: Open N/A N/A Pending Amputation on Yes N/A N/A Presentation: Measurements L x W x D 0.4x0.6x0.5 N/A N/A (cm) Area (cm) : 0.188 N/A N/A Volume (cm) : 0.094 N/A N/A % Reduction in Area: -59.30% N/A N/A % Reduction in Volume: -683.30% N/A N/A Starting Position 1 12 (o'clock): Ending Position 1 12 (o'clock): Maximum Distance 1 1.1 (cm): Theisen, Jackston S. ([id number]) Undermining: Yes N/A N/A Classification: Grade 1 N/A N/A Exudate Amount: Large N/A N/A Exudate Type: Serosanguineous N/A N/A Exudate Color: red, brown N/A N/A Wound Margin: Flat and Intact N/A N/A Granulation Amount: Small (1-33%) N/A N/A Granulation Quality: Red N/A N/A Necrotic Amount: Large (67-100%) N/A N/A Necrotic Tissue: Eschar, Adherent Slough N/A N/A Exposed Structures: Fascia: No N/A N/A Fat: No Tendon: No Muscle: No Joint: No Bone: No Limited to Skin Breakdown Epithelialization: None N/A N/A Periwound Skin Texture: Callus: Yes N/A N/A Edema: No Excoriation: No Induration: No Crepitus: No Fluctuance: No Friable: No Rash: No Scarring: No Periwound Skin Moist: Yes N/A N/A Moisture: Maceration: No Dry/Scaly: No Periwound Skin Color: Atrophie Blanche: No N/A N/A Cyanosis: No Ecchymosis: No Erythema: No Hemosiderin Staining: No Mottled: No Pallor: No Rubor: No Temperature: No Abnormality N/A N/A Tenderness on No N/A N/A Palpation: Wound Preparation: Ulcer Cleansing: N/A N/A Rinsed/Irrigated with Saline Topical Anesthetic Applied: Other: lidocaine 4% MISTY, BROOKS ([id number]) Treatment Notes Electronic Signature(s) Signed: 07/26/2016 5:34:48 PM By: Elliot Gurney, RN, BSN, Kim RN, BSN Entered By: Elliot Gurney, RN, BSN, Kim on 07/26/2016 11:08:50 Heumann, [name] ([id number]) -------------------------------------------------------------------------------- Multi-Disciplinary Care Plan Details Patient Name: CARPENTER, PANNIER. Date of Service: 07/26/2016 10:45 AM Medical Record  Number: [id number] Patient Account Number: [id number] Date of Birth/Sex: [birth year] (45 y.o. Male) Treating RN: Huel Coventry Primary Care Physician: Polo Riley Other Clinician: Referring Physician: Polo Riley Treating Physician/Extender: Rudene Re in Treatment: 1 Active Inactive Orientation to the Wound Care Program Nursing Diagnoses: Knowledge deficit related to the wound healing center program Goals: Patient/caregiver will verbalize understanding of the Wound Healing Center Program Date Initiated: 07/19/2016 Goal Status: Active Interventions: Provide education on orientation to the wound center Notes: Peripheral Neuropathy Nursing Diagnoses: Knowledge deficit related to disease process and management of peripheral neurovascular dysfunction Potential alteration in peripheral tissue perfusion (select prior to confirmation of diagnosis) Goals: Patient/caregiver will verbalize understanding of disease process and disease management Date Initiated: 07/19/2016 Goal Status: Active Interventions: Assess signs and symptoms of neuropathy upon admission and as needed Provide education on Management of Neuropathy and Related Ulcers Provide education on Management of Neuropathy upon discharge from the Wound Center  Screen for HBO Treatment Activities: Consult for HBO : 07/19/2016 Patient referred for customized footwear/offloading : 07/19/2016 Notes: RAYBURN, MUNDIS ([id number]) Venous Leg Ulcer Nursing Diagnoses: Actual venous Insuffiency (use after diagnosis is confirmed) Knowledge deficit related to disease process and management Potential for venous Insuffiency (use before diagnosis confirmed) Goals: Non-invasive venous studies are completed as ordered Date Initiated: 07/19/2016 Goal Status: Active Patient will maintain optimal edema control Date Initiated: 07/19/2016 Goal Status: Active Patient/caregiver will verbalize understanding of disease process and  disease management Date Initiated: 07/19/2016 Goal Status: Active Verify adequate tissue perfusion prior to therapeutic compression application Date Initiated: 07/19/2016 Goal Status: Active Interventions: Assess peripheral edema status every visit. Provide education on venous insufficiency Treatment Activities: Non-invasive vascular studies : 07/19/2016 Notes: Wound/Skin Impairment Nursing Diagnoses: Impaired tissue integrity Knowledge deficit related to ulceration/compromised skin integrity Goals: Patient/caregiver will verbalize understanding of skin care regimen Date Initiated: 07/19/2016 Goal Status: Active Ulcer/skin breakdown will have a volume reduction of 30% by week 4 Date Initiated: 07/19/2016 Goal Status: Active Ulcer/skin breakdown will have a volume reduction of 50% by week 8 Date Initiated: 07/19/2016 SALLIE, MAKER ([id number]) Goal Status: Active Ulcer/skin breakdown will have a volume reduction of 80% by week 12 Date Initiated: 07/19/2016 Goal Status: Active Ulcer/skin breakdown will heal within 14 weeks Date Initiated: 07/19/2016 Goal Status: Active Interventions: Assess patient/caregiver ability to perform ulcer/skin care regimen upon admission and as needed Assess ulceration(s) every visit Provide education on ulcer and skin care Notes: Electronic Signature(s) Signed: 07/26/2016 5:34:48 PM By: Elliot Gurney, RN, BSN, Kim RN, BSN Entered By: Elliot Gurney, RN, BSN, Kim on 07/26/2016 11:08:14 Volk, [name] ([id number]) -------------------------------------------------------------------------------- Pain Assessment Details Patient Name: [name]. Date of Service: 07/26/2016 10:45 AM Medical Record Number: [id number] Patient Account Number: [id number] Date of Birth/Sex: 1971/04/24 (45 y.o. Male) Treating RN: Huel Coventry Primary Care Physician: Polo Riley Other Clinician: Referring Physician: Polo Riley Treating Physician/Extender: Rudene Re in Treatment: 1 Active Problems Location of Pain Severity and Description of Pain Patient Has Paino No Site Locations With Dressing Change: No Pain Management and Medication Current Pain Management: Electronic Signature(s) Signed: 07/26/2016 5:34:48 PM By: Elliot Gurney, RN, BSN, Kim RN, BSN Entered By: Elliot Gurney, RN, BSN, Kim on 07/26/2016 10:52:23 Gonzalez, [name] ([id number]) -------------------------------------------------------------------------------- Patient/Caregiver Education Details Patient Name: [name] Date of Service: 07/26/2016 10:45 AM Medical Record Number: [id number] Patient Account Number: [id number] Date of Birth/Gender: October 01, 1970 (45 y.o. Male) Treating RN: Huel Coventry Primary Care Physician: Polo Riley Other Clinician: Referring Physician: Polo Riley Treating Physician/Extender: Rudene Re in Treatment: 1 Education Assessment Education Provided To: Patient Education Topics Provided Peripheral Neuropathy: Handouts: Diabetes, Neuropathy Methods: Demonstration, Explain/Verbal Responses: State content correctly Wound/Skin Impairment: Handouts: Caring for Your Ulcer Methods: Demonstration Responses: State content correctly Electronic Signature(s) Signed: 07/26/2016 5:34:48 PM By: Elliot Gurney, RN, BSN, Kim RN, BSN Entered By: Elliot Gurney, RN, BSN, Kim on 07/26/2016 11:31:53 Averhart, [name] ([id number]) -------------------------------------------------------------------------------- Wound Assessment Details Patient Name: [name]. Date of Service: 07/26/2016 10:45 AM Medical Record Number: [id number] Patient Account Number: [id number] Date of Birth/Sex: 1971/03/01 (44 y.o. Male) Treating RN: Huel Coventry Primary Care Physician: Polo Riley Other Clinician: Referring Physician: Polo Riley Treating Physician/Extender: Rudene Re in Treatment: 1 Wound Status Wound Number: 1 Primary Diabetic Wound/Ulcer of the  Lower Etiology: Extremity Wound Location: Left Metatarsal head fifth - Plantar Wound Open Status: Wounding Event: Gradually Appeared Comorbid Cataracts, Hypertension, Type II Date Acquired: 01/09/2016 History: Diabetes, Neuropathy Weeks Of Treatment: 1 Clustered Wound:  No Pending Amputation On Presentation Photos Wound Measurements Length: (cm) 0.4 Width: (cm) 0.6 Depth: (cm) 0.5 Area: (cm) 0.188 Volume: (cm) 0.094 % Reduction in Area: -59.3% % Reduction in Volume: -683.3% Epithelialization: None Tunneling: No Undermining: Yes Starting Position (o'clock): 12 Ending Position (o'clock): 12 Maximum Distance: (cm) 1.1 Wound Description Classification: Grade 1 Foul Odor Af Wound Margin: Flat and Intact Exudate Amount: Large Exudate Type: Serosanguineous Exudate Color: red, brown ter Cleansing: No Wound Bed Granulation Amount: Small (1-33%) Exposed Structure Granulation Quality: Red Fascia Exposed: No Sarti, Tatsuo S. ([id number]) Necrotic Amount: Large (67-100%) Fat Layer Exposed: No Necrotic Quality: Eschar, Adherent Slough Tendon Exposed: No Muscle Exposed: No Joint Exposed: No Bone Exposed: No Limited to Skin Breakdown Periwound Skin Texture Texture Color No Abnormalities Noted: No No Abnormalities Noted: No Callus: Yes Atrophie Blanche: No Crepitus: No Cyanosis: No Excoriation: No Ecchymosis: No Fluctuance: No Erythema: No Friable: No Hemosiderin Staining: No Induration: No Mottled: No Localized Edema: No Pallor: No Rash: No Rubor: No Scarring: No Temperature / Pain Moisture Temperature: No Abnormality No Abnormalities Noted: No Dry / Scaly: No Maceration: No Moist: Yes Wound Preparation Ulcer Cleansing: Rinsed/Irrigated with Saline Topical Anesthetic Applied: Other: lidocaine 4%, Treatment Notes Wound #1 (Left, Plantar Metatarsal head fifth) 1. Cleansed with: Clean wound with Normal Saline 2. Anesthetic Topical Lidocaine 4% cream to  wound bed prior to debridement 4. Dressing Applied: Aquacel Ag 5. Secondary Dressing Applied ABD and Kerlix/Conform 6. Footwear/Offloading device applied Wedge shoe Notes Conform Electronic Signature(s) Signed: 07/26/2016 5:34:48 PM By: Elliot Gurney, RN, BSN, Kim RN, BSN [address], [place] ([id number]) Entered By: Elliot Gurney, RN, BSN, Kim on 07/26/2016 11:02:00 Celani, [name] ([id number]) -------------------------------------------------------------------------------- Vitals Details Patient Name: [name]. Date of Service: 07/26/2016 10:45 AM Medical Record Number: [id number] Patient Account Number: [id number] Date of Birth/Sex: May 18, 1971 (45 y.o. Male) Treating RN: Huel Coventry Primary Care Physician: Polo Riley Other Clinician: Referring Physician: Polo Riley Treating Physician/Extender: Rudene Re in Treatment: 1 Vital Signs Time Taken: 10:52 Temperature (F): 97.5 Height (in): 71 Pulse (bpm): 86 Weight (lbs): 298 Respiratory Rate (breaths/min): 16 Body Mass Index (BMI): 41.6 Blood Pressure (mmHg): 145/79 Reference Range: 80 - 120 mg / dl Electronic Signature(s) Signed: 07/26/2016 5:34:48 PM By: Elliot Gurney, RN, BSN, Kim RN, BSN Entered By: Elliot Gurney, RN, BSN, Kim on 07/26/2016 10:52:48

## 2016-07-27 NOTE — Progress Notes (Signed)
[name] HaringREESE, [name] S. ([id number]) Visit Report for 07/26/2016 Chief Complaint Document Details Patient Name: [name] HaringREESE, [name] S. Date of Service: 07/26/2016 10:45 AM Medical Record Number: [id number] Patient Account Number: [id number] Date of Birth/Sex: August 13, 1971 46(45 y.o. Male) Treating RN: Huel CoventryWoody, Kim Primary Care Physician: Polo RileyGEIGER, PATRICIA Other Clinician: Referring Physician: Polo RileyGEIGER, PATRICIA Treating Physician/Extender: Rudene ReBritto, Roan Sawchuk Weeks in Treatment: 1 Information Obtained from: Patient Chief Complaint Patients presents for treatment of an open diabetic ulcer the left plantar foot which she's had for about 2 months Electronic Signature(s) Signed: 07/26/2016 11:33:59 AM By: Evlyn KannerBritto, Bowie Delia MD, FACS Entered By: Evlyn KannerBritto, Ana Woodroof on 07/26/2016 11:33:59 Villeda, [name] PouOBERT S. ([id number]) -------------------------------------------------------------------------------- Debridement Details Patient Name: [name] HaringEESE, Nycholas S. Date of Service: 07/26/2016 10:45 AM Medical Record Number: [id number] Patient Account Number: [id number] Date of Birth/Sex: August 13, 1971 [age] y.o. Male) Treating RN: Huel CoventryWoody, Kim Primary Care Physician: Polo RileyGEIGER, PATRICIA Other Clinician: Referring Physician: Polo RileyGEIGER, PATRICIA Treating Physician/Extender: Rudene ReBritto, Bud Kaeser Weeks in Treatment: 1 Debridement Performed for Wound #1 Left,Plantar Metatarsal head fifth Assessment: Performed By: Physician Evlyn KannerBritto, Leola Fiore, MD Debridement: Debridement Pre-procedure Yes - 11:07 Verification/Time Out Taken: Start Time: 11:07 Pain Control: Lidocaine 4% Topical Solution Level: Skin/Subcutaneous Tissue Total Area Debrided (L x 0.4 (cm) x 0.6 (cm) = 0.24 (cm) W): Tissue and other Non-Viable, Callus, Fibrin/Slough, Skin, Subcutaneous material debrided: Instrument: Forceps, Scissors Bleeding: Moderate Hemostasis Achieved: Silver Nitrate End Time: 11:12 Procedural Pain: 0 Post Procedural Pain: 0 Response to Treatment: Procedure was tolerated  well Post Debridement Measurements of Total Wound Length: (cm) 1.2 Width: (cm) 1.8 Depth: (cm) 0.6 Volume: (cm) 1.018 Character of Wound/Ulcer Post Requires Further Debridement Debridement: Severity of Tissue Post Debridement: Fat layer exposed Post Procedure Diagnosis Same as Pre-procedure Electronic Signature(s) Signed: 07/26/2016 11:33:47 AM By: Evlyn KannerBritto, Arzell Mcgeehan MD, FACS Signed: 07/26/2016 5:34:48 PM By: Elliot GurneyWoody, RN, BSN, Kim RN, BSN Entered By: Evlyn KannerBritto, Sage Hammill on 07/26/2016 11:33:46 Vondrasek, [name] PouOBERT S. ([id number]) Neddo, [name] PouOBERT S. ([id number]) -------------------------------------------------------------------------------- HPI Details Patient Name: [name] HaringEESE, Kamonte S. Date of Service: 07/26/2016 10:45 AM Medical Record Number: [id number] Patient Account Number: [id number] Date of Birth/Sex: August 13, 1971 46(45 y.o. Male) Treating RN: Huel CoventryWoody, Kim Primary Care Physician: Polo RileyGEIGER, PATRICIA Other Clinician: Referring Physician: Polo RileyGEIGER, PATRICIA Treating Physician/Extender: Rudene ReBritto, Gabe Glace Weeks in Treatment: 1 History of Present Illness Location: left plantar foot Quality: Patient reports No Pain. Severity: Patient states wound (s) are getting better. Duration: Patient has had the wound for > 2 months prior to seeking treatment at the wound center Context: The wound would happen gradually Modifying Factors: Other treatment(s) tried include:in seeing a podiatrist who is debrided the wound and also put him on Augmentin HPI Description: 45 year old gentleman known to have diabetes mellitus [facility]so sees Dr. Liz Beachodd Klein of podiatry for a left foot ulcer. what I understand he is not very compliant and he has hyperglycemia and is managed with insulin.past medical history is significant for anxiety, back pain, chronic kidney disease stage III, ulcer, peripheral neuropathy, retinopathy, obesity, status post amputation of the left fourth toe,right vein stripping in January  2015. He has never been a smoker. Last hemoglobin A1c was 9.5% and repeat in August 2017 was 8.2% note he's had his left fourth toe amputated in 2009 and at that time also had received hyperbaric oxygen therapy at the wound clinic. He's had laser ablation of his right varicose vein in 2015 and this was done by Dr. Gilda CreaseSchnier 07/23/2016 -- -- x-ray of the left foot -- IMPRESSION: No acute fracture or subluxation. The patient is status post  amputation of fourth toe and distal aspect of fourth metatarsal. Again noted plantar and posterior spurring of calcaneus. There is dorsal spurring tarsal region. No definite evidence of bone destruction to suggest osteomyelitis. 07/26/2016 -- the patient had his total contact cast applied on Monday and he says by Tuesday night he had lost so much of fluid that it got absolutely loose and he was able to slide his leg out of the cast. Not change the dressing and it got pretty moist noted he call us in the office. Electronic Signature(s) Signed: 07/26/2016 11:35:26 AM By: Evlyn Kanner MD, FACS Entered By: Evlyn Kanner on 07/26/2016 11:35:26 [name], [name] ([id number]) -------------------------------------------------------------------------------- Physical Exam Details Patient Name: [name]. Date of Service: 07/26/2016 10:45 AM Medical Record Number: [id number] Patient Account Number: [id number] Date of Birth/Sex: Dec 22, 1970 (45 y.o. Male) Treating RN: Huel Coventry Primary Care Physician: Polo Riley Other Clinician: Referring Physician: Polo Riley Treating Physician/Extender: Rudene Re in Treatment: 1 Constitutional . Pulse regular. Respirations normal and unlabored. Afebrile. . Eyes Nonicteric. Reactive to light. Ears, Nose, Mouth, and Throat Lips, teeth, and gums WNL.Marland Kitchen Moist mucosa without lesions. Neck supple and nontender. No palpable supraclavicular or cervical adenopathy. Normal sized without goiter. Respiratory WNL.  No retractions.. Cardiovascular Pedal Pulses WNL. No clubbing, cyanosis or edema. Lymphatic No adneopathy. No adenopathy. No adenopathy. Musculoskeletal Adexa without tenderness or enlargement.. Digits and nails w/o clubbing, cyanosis, infection, petechiae, ischemia, or inflammatory conditions.. Integumentary (Hair, Skin) No suspicious lesions. No crepitus or fluctuance. No peri-wound warmth or erythema. No masses.Marland Kitchen Psychiatric Judgement and insight Intact.. No evidence of depression, anxiety, or agitation.. Notes the area is macerated and he has got a lot of fluid draining from this and undermining towards the left lateral region. Single forceps and scissors are sharply removed all the callus and macerated area and bleeding control with a silver nitrate stick. Electronic Signature(s) Signed: 07/26/2016 11:37:06 AM By: Evlyn Kanner MD, FACS Entered By: Evlyn Kanner on 07/26/2016 11:37:06 Dotson, [name] ([id number]) -------------------------------------------------------------------------------- Physician Orders Details Patient Name: [name]. Date of Service: 07/26/2016 10:45 AM Medical Record Number: [id number] Patient Account Number: [id number] Date of Birth/Sex: August 23, 1971 (45 y.o. Male) Treating RN: Huel Coventry Primary Care Physician: Polo Riley Other Clinician: Referring Physician: Polo Riley Treating Physician/Extender: Rudene Re in Treatment: 1 Verbal / Phone Orders: Yes Clinician: Huel Coventry Read Back and Verified: Yes Diagnosis Coding Wound Cleansing Wound #1 Left,Plantar Metatarsal head fifth o Cleanse wound with mild soap and water o May Shower, gently pat wound dry prior to applying new dressing. o May shower with protection. Primary Wound Dressing Wound #1 Left,Plantar Metatarsal head fifth o Sorbalgon Ag Secondary Dressing Wound #1 Left,Plantar Metatarsal head fifth o Gauze and Kerlix/Conform Dressing Change  Frequency Wound #1 Left,Plantar Metatarsal head fifth o Change dressing every day. Follow-up Appointments Wound #1 Left,Plantar Metatarsal head fifth o Return Appointment in 1 week. Off-Loading Wound #1 Left,Plantar Metatarsal head fifth o Other: - felt/foam Additional Orders / Instructions Wound #1 Left,Plantar Metatarsal head fifth o Increase protein intake. o Activity as tolerated o Other: - ZInc, vit A, C, MVI o Other: - Weigh self daily check with PCP regarding fluid retention and blood sugars [name], [name] ([id number]) Electronic Signature(s) Signed: 07/26/2016 4:11:15 PM By: Evlyn Kanner MD, FACS Signed: 07/26/2016 5:34:48 PM By: Elliot Gurney RN, BSN, Kim RN, BSN Entered By: Elliot Gurney, RN, BSN, Kim on 07/26/2016 11:19:00 Luckenbaugh, [name] ([id number]) -------------------------------------------------------------------------------- Problem List Details Patient Name: [name], MELANDER. Date of Service:  07/26/2016 10:45 AM Medical Record Number: [id number] Patient Account Number: [id number] Date of Birth/Sex: 1970/10/02 (45 y.o. Male) Treating RN: Huel Coventry Primary Care Physician: Polo Riley Other Clinician: Referring Physician: Polo Riley Treating Physician/Extender: Rudene Re in Treatment: 1 Active Problems ICD-10 Encounter Code Description Active Date Diagnosis E11.621 Type 2 diabetes mellitus with foot ulcer 07/19/2016 Yes L97.522 Non-pressure chronic ulcer of other part of left foot with fat 07/19/2016 Yes layer exposed E66.3 Overweight 07/19/2016 Yes I87.302 Chronic venous hypertension (idiopathic) without 07/19/2016 Yes complications of left lower extremity Inactive Problems Resolved Problems Electronic Signature(s) Signed: 07/26/2016 11:33:03 AM By: Evlyn Kanner MD, FACS Entered By: Evlyn Kanner on 07/26/2016 11:33:03 Spink, [name]  ([id number]) -------------------------------------------------------------------------------- Progress Note Details Patient Name: [name]. Date of Service: 07/26/2016 10:45 AM Medical Record Number: [id number] Patient Account Number: [id number] Date of Birth/Sex: 02-15-1971 (45 y.o. Male) Treating RN: Huel Coventry Primary Care Physician: Polo Riley Other Clinician: Referring Physician: Polo Riley Treating Physician/Extender: Rudene Re in Treatment: 1 Subjective Chief Complaint Information obtained from Patient Patients presents for treatment of an open diabetic ulcer the left plantar foot which she's had for about 2 months History of Present Illness (HPI) The following HPI elements were documented for the patient's wound: Location: left plantar foot Quality: Patient reports No Pain. Severity: Patient states wound (s) are getting better. Duration: Patient has had the wound for > 2 months prior to seeking treatment at the wound center Context: The wound would happen gradually Modifying Factors: Other treatment(s) tried include:in seeing a podiatrist who is debrided the wound and also put him on Augmentin 45 year old gentleman known to have diabetes mellitus cc Dr. [facility]ber [facility] the [facility] sees Dr. Liz Beach of podiatry for a left foot ulcer. what I understand he is not very compliant and he has hyperglycemia and is managed with insulin.past medical history is significant for anxiety, back pain, chronic kidney disease stage III, ulcer, peripheral neuropathy, retinopathy, obesity, status post amputation of the left fourth toe,right vein stripping in January 2015. He has never been a smoker. Last hemoglobin A1c was 9.5% and repeat in August 2017 was 8.2% note he's had his left fourth toe amputated in 2009 and at that time also had received hyperbaric oxygen therapy at the wound clinic. He's had laser ablation of his right varicose vein in 2015 and  this was done by Dr. Gilda Crease 07/23/2016 -- -- x-ray of the left foot -- IMPRESSION: No acute fracture or subluxation. The patient is status post amputation of fourth toe and distal aspect of fourth metatarsal. Again noted plantar and posterior spurring of calcaneus. There is dorsal spurring tarsal region. No definite evidence of bone destruction to suggest osteomyelitis. 07/26/2016 -- the patient had his total contact cast applied on Monday and he says by Tuesday night he had lost so much of fluid that it got absolutely loose and he was able to slide his leg out of the cast. Not change the dressing and it got pretty moist noted he call us in the office. [name], [name] ([id number]) Objective Constitutional Pulse regular. Respirations normal and unlabored. Afebrile. Vitals Time Taken: 10:52 AM, Height: 71 in, Weight: 298 lbs, BMI: 41.6, Temperature: 97.5 F, Pulse: 86 bpm, Respiratory Rate: 16 breaths/min, Blood Pressure: 145/79 mmHg. Eyes Nonicteric. Reactive to light. Ears, Nose, Mouth, and Throat Lips, teeth, and gums WNL.Marland Kitchen Moist mucosa without lesions. Neck supple and nontender. No palpable supraclavicular or cervical adenopathy. Normal sized without goiter. Respiratory WNL. No retractions.. Cardiovascular  Pedal Pulses WNL. No clubbing, cyanosis or edema. Lymphatic No adneopathy. No adenopathy. No adenopathy. Musculoskeletal Adexa without tenderness or enlargement.. Digits and nails w/o clubbing, cyanosis, infection, petechiae, ischemia, or inflammatory conditions.Marland Kitchen Psychiatric Judgement and insight Intact.. No evidence of depression, anxiety, or agitation.. General Notes: the area is macerated and he has got a lot of fluid draining from this and undermining towards the left lateral region. Single forceps and scissors are sharply removed all the callus and macerated area and bleeding control with a silver nitrate stick. Integumentary (Hair, Skin) No suspicious lesions. No  crepitus or fluctuance. No peri-wound warmth or erythema. No masses.. Wound #1 status is Open. Original cause of wound was Gradually Appeared. The wound is located on the Left,Plantar Metatarsal head fifth. The wound measures 0.4cm length x 0.6cm width x 0.5cm depth; 0.188cm^2 area and 0.094cm^3 volume. The wound is limited to skin breakdown. There is no tunneling noted, however, there is undermining starting at 12:00 and ending at 12:00 with a maximum distance of 1.1cm. There is a large amount of serosanguineous drainage noted. The wound margin is flat and intact. There is small (1-33%) red granulation within the wound bed. There is a large (67-100%) amount of necrotic tissue within the wound bed including Eschar and Adherent Slough. The periwound skin appearance [name], [name] UHLIG. ([id number]) exhibited: Callus, Moist. The periwound skin appearance did not exhibit: Crepitus, Excoriation, Fluctuance, Friable, Induration, Localized Edema, Rash, Scarring, Dry/Scaly, Maceration, Atrophie Blanche, Cyanosis, Ecchymosis, Hemosiderin Staining, Mottled, Pallor, Rubor, Erythema. Periwound temperature was noted as No Abnormality. Assessment Active Problems ICD-10 E11.621 - Type 2 diabetes mellitus with foot ulcer L97.522 - Non-pressure chronic ulcer of other part of left foot with fat layer exposed E66.3 - Overweight I87.302 - Chronic venous hypertension (idiopathic) without complications of left lower extremity He has not done well with a total contact cast and was able to slide his foot out of the intact cast. At this stage I have recommended daily changing of dressing with silver alginate and a felt ring and to offload with his Darco front off loading shoe. I have asked him to keep [place] informed Monday to Friday 8-5 in case there is any change and if it is off working hours, he should report to the ER immediately. He is urged to take better control of his diet and talk to his PCP about his fluctuation  in weight and fluid retention. Procedures Wound #1 Wound #1 is a Diabetic Wound/Ulcer of the Lower Extremity located on the Left,Plantar Metatarsal head fifth . There was a Skin/Subcutaneous Tissue Debridement ([id number]) debridement with total area of 0.24 sq cm performed by Evlyn Kanner, MD. with the following instrument(s): Forceps and Scissors to remove Non-Viable tissue/material including Fibrin/Slough, Skin, Callus, and Subcutaneous after achieving pain control using Lidocaine 4% Topical Solution. A time out was conducted at 11:07, prior to the start of the procedure. A Moderate amount of bleeding was controlled with Silver Nitrate. The procedure was tolerated well with a pain level of 0 throughout and a pain level of 0 following the procedure. Post Debridement Measurements: 1.2cm length x 1.8cm width x 0.6cm depth; 1.018cm^3 volume. Character of Wound/Ulcer Post Debridement requires further debridement. Severity of Tissue Post Debridement is: Fat layer exposed. Post procedure Diagnosis Wound #1: Same as Pre-Procedure [name], [name] S. ([id number]) Plan Wound Cleansing: Wound #1 Left,Plantar Metatarsal head fifth: Cleanse wound with mild soap and water May Shower, gently pat wound dry prior to applying new dressing. May shower with protection. Primary Wound  Dressing: Wound #1 Left,Plantar Metatarsal head fifth: Sorbalgon Ag Secondary Dressing: Wound #1 Left,Plantar Metatarsal head fifth: Gauze and Kerlix/Conform Dressing Change Frequency: Wound #1 Left,Plantar Metatarsal head fifth: Change dressing every day. Follow-up Appointments: Wound #1 Left,Plantar Metatarsal head fifth: Return Appointment in 1 week. Off-Loading: Wound #1 Left,Plantar Metatarsal head fifth: Other: - felt/foam Additional Orders / Instructions: Wound #1 Left,Plantar Metatarsal head fifth: Increase protein intake. Activity as tolerated Other: - ZInc, vit A, C, MVI Other: - Weigh self daily check  with PCP regarding fluid retention and blood sugars He has not done well with a total contact cast and was able to slide his foot out of the intact cast. At this stage I have recommended daily changing of dressing with silver alginate and a felt ring and to offload with his Darco front off loading shoe. I have asked him to keep [place] informed Monday to Friday 8-5 in case there is any change and if it is off working hours, he should report to the ER immediately. [name], [name] ([id number]) He is urged to take better control of his diet and talk to his PCP about his fluctuation in weight and fluid retention. Electronic Signature(s) Signed: 07/26/2016 11:40:04 AM By: Evlyn Kanner MD, FACS Entered By: Evlyn Kanner on 07/26/2016 11:40:03 [name], [name] ([id number]) -------------------------------------------------------------------------------- SuperBill Details Patient Name: [name]. Date of Service: 07/26/2016 Medical Record Number: [id number] Patient Account Number: [id number] Date of Birth/Sex: [birth year] (45 y.o. Male) Treating RN: Huel Coventry Primary Care Physician: Polo Riley Other Clinician: Referring Physician: Polo Riley Treating Physician/Extender: Rudene Re in Treatment: 1 Diagnosis Coding ICD-10 Codes Code Description E11.621 Type 2 diabetes mellitus with foot ulcer L97.522 Non-pressure chronic ulcer of other part of left foot with fat layer exposed E66.3 Overweight I87.302 Chronic venous hypertension (idiopathic) without complications of left lower extremity Facility Procedures CPT4: Description Modifier Quantity Code [id number] 11042 - DEB SUBQ TISSUE 20 SQ CM/< 1 ICD-10 Description Diagnosis E11.621 Type 2 diabetes mellitus with foot ulcer L97.522 Non-pressure chronic ulcer of other part of left foot with fat layer exposed  I87.302 Chronic venous hypertension (idiopathic) without complications of left lower extremity Physician Procedures CPT4:  Description Modifier Quantity Code [id number] 11042 - WC PHYS SUBQ TISS 20 SQ CM 1 ICD-10 Description Diagnosis E11.621 Type 2 diabetes mellitus with foot ulcer L97.522 Non-pressure chronic ulcer of other part of left foot with fat layer exposed  I87.302 Chronic venous hypertension (idiopathic) without complications of left lower extremity Electronic Signature(s) Signed: 07/26/2016 11:40:22 AM By: Evlyn Kanner MD, FACS Entered By: Evlyn Kanner on 07/26/2016 11:40:21

## 2016-08-02 ENCOUNTER — Encounter: Payer: BLUE CROSS/BLUE SHIELD | Admitting: Surgery

## 2016-08-02 DIAGNOSIS — E11621 Type 2 diabetes mellitus with foot ulcer: Secondary | ICD-10-CM | POA: Diagnosis not present

## 2016-08-03 NOTE — Progress Notes (Signed)
JEHAN, BONANO ([id number]) Visit Report for 08/02/2016 Arrival Information Details Patient Name: [name], [name]. Date of Service: 08/02/2016 9:30 AM Medical Record Number: [id number] Patient Account Number: [id number] Date of Birth/Sex: 05/26/1971 (45 y.o. Male) Treating RN: Huel Coventry Primary Care Physician: Polo Riley Other Clinician: Referring Physician: Polo Riley Treating Physician/Extender: Rudene Re in Treatment: 2 Visit Information History Since Last Visit Added or deleted any medications: No Patient Arrived: Ambulatory Any new allergies or adverse reactions: No Arrival Time: 10:10 Had a fall or experienced change in No Accompanied By: wife activities of daily living that may affect Transfer Assistance: None risk of falls: Patient Identification Verified: Yes Signs or symptoms of abuse/neglect since last No Secondary Verification Process Yes visito Completed: Has Dressing in Place as Prescribed: Yes Patient Has Alerts: Yes Has Footwear/Offloading in Place as Yes Patient Alerts: DMII Prescribed: Left: Wedge Shoe Pain Present Now: No Electronic Signature(s) Signed: 08/02/2016 5:39:47 PM By: Elliot Gurney, RN, BSN, Kim RN, BSN Entered By: Elliot Gurney, RN, BSN, Kim on 08/02/2016 10:10:59 Cielo, [name] ([id number]) -------------------------------------------------------------------------------- Encounter Discharge Information Details Patient Name: [name]. Date of Service: 08/02/2016 9:30 AM Medical Record Number: [id number] Patient Account Number: [id number] Date of Birth/Sex: 18-Nov-1970 (45 y.o. Male) Treating RN: Huel Coventry Primary Care Physician: Polo Riley Other Clinician: Referring Physician: Polo Riley Treating Physician/Extender: Rudene Re in Treatment: 2 Encounter Discharge Information Items Discharge Pain Level: 0 Discharge Condition: Stable Ambulatory Status: Ambulatory Discharge Destination:  Home Transportation: Private Auto Accompanied By: wife Schedule Follow-up Appointment: Yes Medication Reconciliation completed and provided to Patient/Care Yes Consetta Cosner: Provided on Clinical Summary of Care: 08/02/2016 Form Type Recipient Paper Patient RR Electronic Signature(s) Signed: 08/02/2016 5:39:47 PM By: Elliot Gurney RN, BSN, Kim RN, BSN Previous Signature: 08/02/2016 10:54:31 AM Version By: Gwenlyn Perking Entered By: Elliot Gurney RN, BSN, Kim on 08/02/2016 10:57:09 [name], [name] ([id number]) -------------------------------------------------------------------------------- Lower Extremity Assessment Details Patient Name: [name]. Date of Service: 08/02/2016 9:30 AM Medical Record Number: [id number] Patient Account Number: [id number] Date of Birth/Sex: [birth year] (45 y.o. Male) Treating RN: Huel Coventry Primary Care Physician: Polo Riley Other Clinician: Referring Physician: Polo Riley Treating Physician/Extender: Rudene Re in Treatment: 2 Edema Assessment Assessed: [Left: Yes] [Right: No] E[Left: dema] [Right: :] Vascular Assessment Pulses: Posterior Tibial Palpable: [Left:Yes] Dorsalis Pedis Palpable: [Left:Yes] Extremity colors, hair growth, and conditions: Extremity Color: [Left:Hyperpigmented] Hair Growth on Extremity: [Left:Yes] Temperature of Extremity: [Left:Warm] Capillary Refill: [Left:> 3 seconds] Dependent Rubor: [Left:No] Blanched when Elevated: [Left:No] Lipodermatosclerosis: [Left:No] Toe Nail Assessment Left: Right: Thick: Yes Discolored: Yes Deformed: Yes Improper Length and Hygiene: No Electronic Signature(s) Signed: 08/02/2016 5:39:47 PM By: Elliot Gurney, RN, BSN, Kim RN, BSN Entered By: Elliot Gurney, RN, BSN, Kim on 08/02/2016 10:16:10 Grasse, [name] ([id number]) -------------------------------------------------------------------------------- Multi Wound Chart Details Patient Name: [name]. Date of Service: 08/02/2016 9:30  AM Medical Record Number: [id number] Patient Account Number: [id number] Date of Birth/Sex: 05-Oct-1970 (45 y.o. Male) Treating RN: Huel Coventry Primary Care Physician: Polo Riley Other Clinician: Referring Physician: Polo Riley Treating Physician/Extender: Rudene Re in Treatment: 2 Vital Signs Height(in): 71 Pulse(bpm): 88 Weight(lbs): 298 Blood Pressure 132/88 (mmHg): Body Mass Index(BMI): 42 Temperature(F): 98.1 Respiratory Rate 16 (breaths/min): Photos: [N/A:N/A] Wound Location: Left Metatarsal head fifth - N/A N/A Plantar Wounding Event: Gradually Appeared N/A N/A Primary Etiology: Diabetic Wound/Ulcer of N/A N/A the Lower Extremity Comorbid History: Cataracts, Hypertension, N/A N/A Type II Diabetes, Neuropathy Date Acquired: 01/09/2016 N/A N/A Weeks of Treatment: 2 N/A N/A Wound Status: Open N/A  N/A Pending Amputation on Yes N/A N/A Presentation: Measurements L x W x D 0.4x0.2x0.3 N/A N/A (cm) Area (cm) : 0.063 N/A N/A Volume (cm) : 0.019 N/A N/A % Reduction in Area: 46.60% N/A N/A % Reduction in Volume: -58.30% N/A N/A Starting Position 1 12 (o'clock): Ending Position 1 3 (o'clock): Maximum Distance 1 0.3 (cm): [name], [name] S. ([id number]) Undermining: Yes N/A N/A Classification: Grade 2 N/A N/A Exudate Amount: Medium N/A N/A Exudate Type: Serous N/A N/A Exudate Color: amber N/A N/A Wound Margin: Flat and Intact N/A N/A Granulation Amount: Large (67-100%) N/A N/A Granulation Quality: Red N/A N/A Necrotic Amount: Small (1-33%) N/A N/A Exposed Structures: Fascia: No N/A N/A Fat: No Tendon: No Muscle: No Joint: No Bone: No Epithelialization: None N/A N/A Periwound Skin Texture: Callus: Yes N/A N/A Edema: No Excoriation: No Induration: No Crepitus: No Fluctuance: No Friable: No Rash: No Scarring: No Periwound Skin Moist: Yes N/A N/A Moisture: Maceration: No Dry/Scaly: No Periwound Skin Color: Atrophie Blanche: No  N/A N/A Cyanosis: No Ecchymosis: No Erythema: No Hemosiderin Staining: No Mottled: No Pallor: No Rubor: No Temperature: No Abnormality N/A N/A Tenderness on No N/A N/A Palpation: Wound Preparation: Ulcer Cleansing: N/A N/A Rinsed/Irrigated with Saline Topical Anesthetic Applied: Other: lidocaine 4% Treatment Notes [name], [name] ([id number]) Electronic Signature(s) Signed: 08/02/2016 5:39:47 PM By: Elliot Gurney, RN, BSN, Kim RN, BSN Entered By: Elliot Gurney, RN, BSN, Kim on 08/02/2016 10:19:53 [name], [name] ([id number]) -------------------------------------------------------------------------------- Multi-Disciplinary Care Plan Details Patient Name: [name], [name]. Date of Service: 08/02/2016 9:30 AM Medical Record Number: [id number] Patient Account Number: [id number] Date of Birth/Sex: 04-Mar-1971 (45 y.o. Male) Treating RN: Huel Coventry Primary Care Physician: Polo Riley Other Clinician: Referring Physician: Polo Riley Treating Physician/Extender: Rudene Re in Treatment: 2 Active Inactive Orientation to the Wound Care Program Nursing Diagnoses: Knowledge deficit related to the wound healing center program Goals: Patient/caregiver will verbalize understanding of the Wound Healing Center Program Date Initiated: 07/19/2016 Goal Status: Active Interventions: Provide education on orientation to the wound center Notes: Peripheral Neuropathy Nursing Diagnoses: Knowledge deficit related to disease process and management of peripheral neurovascular dysfunction Potential alteration in peripheral tissue perfusion (select prior to confirmation of diagnosis) Goals: Patient/caregiver will verbalize understanding of disease process and disease management Date Initiated: 07/19/2016 Goal Status: Active Interventions: Assess signs and symptoms of neuropathy upon admission and as needed Provide education on Management of Neuropathy and Related Ulcers Provide education  on Management of Neuropathy upon discharge from the Wound Center Screen for HBO Treatment Activities: Consult for HBO : 07/19/2016 Patient referred for customized footwear/offloading : 07/19/2016 Notes: KAITLYN, SKOWRON ([id number]) Venous Leg Ulcer Nursing Diagnoses: Actual venous Insuffiency (use after diagnosis is confirmed) Knowledge deficit related to disease process and management Potential for venous Insuffiency (use before diagnosis confirmed) Goals: Non-invasive venous studies are completed as ordered Date Initiated: 07/19/2016 Goal Status: Active Patient will maintain optimal edema control Date Initiated: 07/19/2016 Goal Status: Active Patient/caregiver will verbalize understanding of disease process and disease management Date Initiated: 07/19/2016 Goal Status: Active Verify adequate tissue perfusion prior to therapeutic compression application Date Initiated: 07/19/2016 Goal Status: Active Interventions: Assess peripheral edema status every visit. Provide education on venous insufficiency Treatment Activities: Non-invasive vascular studies : 07/19/2016 Notes: Wound/Skin Impairment Nursing Diagnoses: Impaired tissue integrity Knowledge deficit related to ulceration/compromised skin integrity Goals: Patient/caregiver will verbalize understanding of skin care regimen Date Initiated: 07/19/2016 Goal Status: Active Ulcer/skin breakdown will have a volume reduction of 30% by week 4 Date Initiated: 07/19/2016 Goal Status:  Active Ulcer/skin breakdown will have a volume reduction of 50% by week 8 Date Initiated: 07/19/2016 [name], [name] ([id number]) Goal Status: Active Ulcer/skin breakdown will have a volume reduction of 80% by week 12 Date Initiated: 07/19/2016 Goal Status: Active Ulcer/skin breakdown will heal within 14 weeks Date Initiated: 07/19/2016 Goal Status: Active Interventions: Assess patient/caregiver ability to perform ulcer/skin care regimen upon  admission and as needed Assess ulceration(s) every visit Provide education on ulcer and skin care Notes: Electronic Signature(s) Signed: 08/02/2016 5:39:47 PM By: Elliot Gurney, RN, BSN, Kim RN, BSN Entered By: Elliot Gurney, RN, BSN, Kim on 08/02/2016 10:19:46 [name], [name] ([id number]) -------------------------------------------------------------------------------- Pain Assessment Details Patient Name: [name]. Date of Service: 08/02/2016 9:30 AM Medical Record Number: [id number] Patient Account Number: [id number] Date of Birth/Sex: November 20, 1970 (45 y.o. Male) Treating RN: Huel Coventry Primary Care Physician: Polo Riley Other Clinician: Referring Physician: Polo Riley Treating Physician/Extender: Rudene Re in Treatment: 2 Active Problems Location of Pain Severity and Description of Pain Patient Has Paino No Site Locations With Dressing Change: No Pain Management and Medication Current Pain Management: Electronic Signature(s) Signed: 08/02/2016 5:39:47 PM By: Elliot Gurney, RN, BSN, Kim RN, BSN Entered By: Elliot Gurney, RN, BSN, Kim on 08/02/2016 10:11:05 [name], [name] ([id number]) -------------------------------------------------------------------------------- Patient/Caregiver Education Details Patient Name: [name] Date of Service: 08/02/2016 9:30 AM Medical Record Number: [id number] Patient Account Number: [id number] Date of Birth/Gender: 06-Jan-1971 (45 y.o. Male) Treating RN: Huel Coventry Primary Care Physician: Polo Riley Other Clinician: Referring Physician: Polo Riley Treating Physician/Extender: Rudene Re in Treatment: 2 Education Assessment Education Provided To: Patient Education Topics Provided Wound/Skin Impairment: Handouts: Caring for Your Ulcer, Other: Bledsoe walking boot Methods: Demonstration Responses: State content correctly Electronic Signature(s) Signed: 08/02/2016 5:39:47 PM By: Elliot Gurney, RN, BSN, Kim RN, BSN Entered By:  Elliot Gurney, RN, BSN, Kim on 08/02/2016 10:57:31 [name], [name] ([id number]) -------------------------------------------------------------------------------- Wound Assessment Details Patient Name: [name]. Date of Service: 08/02/2016 9:30 AM Medical Record Number: [id number] Patient Account Number: [id number] Date of Birth/Sex: 1971/08/29 (45 y.o. Male) Treating RN: Huel Coventry Primary Care Physician: Polo Riley Other Clinician: Referring Physician: Polo Riley Treating Physician/Extender: Rudene Re in Treatment: 2 Wound Status Wound Number: 1 Primary Diabetic Wound/Ulcer of the Lower Etiology: Extremity Wound Location: Left Metatarsal head fifth - Plantar Wound Open Status: Wounding Event: Gradually Appeared Comorbid Cataracts, Hypertension, Type II Date Acquired: 01/09/2016 History: Diabetes, Neuropathy Weeks Of Treatment: 2 Clustered Wound: No Pending Amputation On Presentation Photos Wound Measurements Length: (cm) 0.4 Width: (cm) 0.2 Depth: (cm) 0.3 Area: (cm) 0.063 Volume: (cm) 0.019 % Reduction in Area: 46.6% % Reduction in Volume: -58.3% Epithelialization: None Tunneling: No Undermining: Yes Starting Position (o'clock): 12 Ending Position (o'clock): 3 Maximum Distance: (cm) 0.3 Wound Description Classification: Grade 2 Foul Odor Aft Wound Margin: Flat and Intact Exudate Amount: Medium Exudate Type: Serous Exudate Color: amber er Cleansing: No Wound Bed Granulation Amount: Large (67-100%) Exposed Structure Granulation Quality: Red Fascia Exposed: No Oyen, [name] S. ([id number]) Necrotic Amount: Small (1-33%) Fat Layer Exposed: No Necrotic Quality: Adherent Slough Tendon Exposed: No Muscle Exposed: No Joint Exposed: No Bone Exposed: No Periwound Skin Texture Texture Color No Abnormalities Noted: No No Abnormalities Noted: No Callus: Yes Atrophie Blanche: No Crepitus: No Cyanosis: No Excoriation: No Ecchymosis:  No Fluctuance: No Erythema: No Friable: No Hemosiderin Staining: No Induration: No Mottled: No Localized Edema: No Pallor: No Rash: No Rubor: No Scarring: No Temperature / Pain Moisture Temperature: No Abnormality No Abnormalities Noted: No Dry / Scaly:  No Maceration: No Moist: Yes Wound Preparation Ulcer Cleansing: Rinsed/Irrigated with Saline Topical Anesthetic Applied: Other: lidocaine 4%, Treatment Notes Wound #1 (Left, Plantar Metatarsal head fifth) 1. Cleansed with: Clean wound with Normal Saline 2. Anesthetic Topical Lidocaine 4% cream to wound bed prior to debridement 4. Dressing Applied: Aquacel Ag 5. Secondary Dressing Applied Gauze and Kerlix/Conform Notes Conform and off-loading felt Electronic Signature(s) Signed: 08/02/2016 5:39:47 PM By: Elliot GurneyWoody, RN, BSN, Kim RN, BSN Entered By: Elliot GurneyWoody, RN, BSN, Kim on 08/02/2016 10:19:35 [name], [name] PouOBERT S. ([id number]) -------------------------------------------------------------------------------- Vitals Details Patient Name: [name] HaringEESE, Harout S. Date of Service: 08/02/2016 9:30 AM Medical Record Number: [id number] Patient Account Number: [id number] Date of Birth/Sex: 08-20-1971 2(45 y.o. Male) Treating RN: Huel CoventryWoody, Kim Primary Care Physician: Polo RileyGEIGER, PATRICIA Other Clinician: Referring Physician: Polo RileyGEIGER, PATRICIA Treating Physician/Extender: Rudene ReBritto, Errol Weeks in Treatment: 2 Vital Signs Time Taken: 10:11 Temperature (F): 98.1 Height (in): 71 Pulse (bpm): 88 Weight (lbs): 298 Respiratory Rate (breaths/min): 16 Body Mass Index (BMI): 41.6 Blood Pressure (mmHg): 132/88 Reference Range: 80 - 120 mg / dl Electronic Signature(s) Signed: 08/02/2016 5:39:47 PM By: Elliot GurneyWoody, RN, BSN, Kim RN, BSN Entered By: Elliot GurneyWoody, RN, BSN, Kim on 08/02/2016 10:11:45

## 2016-08-03 NOTE — Progress Notes (Signed)
LEDELL, CODRINGTON ([id number]) Visit Report for 08/02/2016 Chief Complaint Document Details Patient Name: [name], [name]. Date of Service: 08/02/2016 9:30 AM Medical Record Number: [id number] Patient Account Number: [id number] Date of Birth/Sex: May 10, 1971 (45 y.o. Male) Treating RN: Huel Coventry Primary Care Physician: Polo Riley Other Clinician: Referring Physician: Polo Riley Treating Physician/Extender: Rudene Re in Treatment: 2 Information Obtained from: Patient Chief Complaint Patients presents for treatment of an open diabetic ulcer the left plantar foot which she's had for about 2 months Electronic Signature(s) Signed: 08/02/2016 11:15:12 AM By: Evlyn Kanner MD, FACS Entered By: Evlyn Kanner on 08/02/2016 11:15:12 Ahles, [name] ([id number]) -------------------------------------------------------------------------------- Debridement Details Patient Name: [name]. Date of Service: 08/02/2016 9:30 AM Medical Record Number: [id number] Patient Account Number: [id number] Date of Birth/Sex: 1971-02-22 (45 y.o. Male) Treating RN: Huel Coventry Primary Care Physician: Polo Riley Other Clinician: Referring Physician: Polo Riley Treating Physician/Extender: Rudene Re in Treatment: 2 Debridement Performed for Wound #1 Left,Plantar Metatarsal head fifth Assessment: Performed By: Physician Evlyn Kanner, MD Debridement: Debridement Pre-procedure Yes - 10:40 Verification/Time Out Taken: Start Time: 10:41 Pain Control: Other : lidocaine 4% Level: Skin/Subcutaneous Tissue Total Area Debrided (L x 0.4 (cm) x 0.2 (cm) = 0.08 (cm) W): Tissue and other Viable, Non-Viable, Callus, Fibrin/Slough, Skin, Subcutaneous material debrided: Instrument: Curette Bleeding: Minimum Hemostasis Achieved: Pressure End Time: 10:45 Procedural Pain: 0 Post Procedural Pain: 0 Response to Treatment: Procedure was tolerated well Post Debridement  Measurements of Total Wound Length: (cm) 0.4 Width: (cm) 0.3 Depth: (cm) 0.2 Volume: (cm) 0.019 Character of Wound/Ulcer Post Requires Further Debridement Debridement: Severity of Tissue Post Debridement: Fat layer exposed Post Procedure Diagnosis Same as Pre-procedure Notes besides a subcutaneous debridement a lot of the surrounding callus was sharply debrided and the edges were nicely saucerized. Electronic Signature(s) Signed: 08/02/2016 11:15:04 AM By: Evlyn Kanner MD, FACS EUTIMIO, GHARIBIAN ([id number]) Signed: 08/02/2016 5:39:47 PM By: Elliot Gurney RN, BSN, Kim RN, BSN Entered By: Evlyn Kanner on 08/02/2016 11:15:04 Farver, [name] ([id number]) -------------------------------------------------------------------------------- HPI Details Patient Name: [name]. Date of Service: 08/02/2016 9:30 AM Medical Record Number: [id number] Patient Account Number: [id number] Date of Birth/Sex: Mar 05, 1971 (45 y.o. Male) Treating RN: Huel Coventry Primary Care Physician: Polo Riley Other Clinician: Referring Physician: Polo Riley Treating Physician/Extender: Rudene Re in Treatment: 2 History of Present Illness Location: left plantar foot Quality: Patient reports No Pain. Severity: Patient states wound (s) are getting better. Duration: Patient has had the wound for > 2 months prior to seeking treatment at the wound center Context: The wound would happen gradually Modifying Factors: Other treatment(s) tried include:in seeing a podiatrist who is debrided the wound and also put him on Augmentin HPI Description: 45 year old gentleman known to have diabetes mellitus cc Dr. Mickey Farber at the [facility] sees Dr. Liz Beach of podiatry for a left foot ulcer. what I understand he is not very compliant and he has hyperglycemia and is managed with insulin.past medical history is significant for anxiety, back pain, chronic kidney disease stage III, ulcer, peripheral  neuropathy, retinopathy, obesity, status post amputation of the left fourth toe,right vein stripping in January 2015. He has never been a smoker. Last hemoglobin A1c was 9.5% and repeat in August 2017 was 8.2% note he's had his left fourth toe amputated in 2009 and at that time also had received hyperbaric oxygen therapy at the wound clinic. He's had laser ablation of his right varicose vein in 2015 and this was done by Dr. Gilda Crease 07/23/2016 -- --  x-ray of the left foot -- IMPRESSION: No acute fracture or subluxation. The patient is status post amputation of fourth toe and distal aspect of fourth metatarsal. Again noted plantar and posterior spurring of calcaneus. There is dorsal spurring tarsal region. No definite evidence of bone destruction to suggest osteomyelitis. 07/26/2016 -- the patient had his total contact cast applied on Monday and he says by Tuesday night he had lost so much of fluid that it got absolutely loose and he was able to slide his leg out of the cast. Not change the dressing and it got pretty moist noted he call us in the office. Electronic Signature(s) Signed: 08/02/2016 11:15:21 AM By: Evlyn Kanner MD, FACS Entered By: Evlyn Kanner on 08/02/2016 11:15:21 Samet, [name] ([id number]) -------------------------------------------------------------------------------- Physical Exam Details Patient Name: [name]. Date of Service: 08/02/2016 9:30 AM Medical Record Number: [id number] Patient Account Number: [id number] Date of Birth/Sex: 13-Feb-1971 (45 y.o. Male) Treating RN: Huel Coventry Primary Care Physician: Polo Riley Other Clinician: Referring Physician: Polo Riley Treating Physician/Extender: Rudene Re in Treatment: 2 Constitutional . Pulse regular. Respirations normal and unlabored. Afebrile. . Eyes Nonicteric. Reactive to light. Ears, Nose, Mouth, and Throat Lips, teeth, and gums WNL.Marland Kitchen Moist mucosa without lesions. Neck supple and  nontender. No palpable supraclavicular or cervical adenopathy. Normal sized without goiter. Respiratory WNL. No retractions.. Breath sounds WNL, No rubs, rales, rhonchi, or wheeze.. Cardiovascular Heart rhythm and rate regular, no murmur or gallop.. Pedal Pulses WNL. No clubbing, cyanosis or edema. Lymphatic No adneopathy. No adenopathy. No adenopathy. Musculoskeletal Adexa without tenderness or enlargement.. Digits and nails w/o clubbing, cyanosis, infection, petechiae, ischemia, or inflammatory conditions.. Integumentary (Hair, Skin) No suspicious lesions. No crepitus or fluctuance. No peri-wound warmth or erythema. No masses.Marland Kitchen Psychiatric Judgement and insight Intact.. No evidence of depression, anxiety, or agitation.. Notes maceration has gone down significantly but the patient tends to form a lot of callus and are sharply removed the surrounding callus and the subcutaneous debris with a #3 curet and bleeding controlled with pressure. Electronic Signature(s) Signed: 08/02/2016 11:15:51 AM By: Evlyn Kanner MD, FACS Entered By: Evlyn Kanner on 08/02/2016 11:15:50 Solar, [name] ([id number]) -------------------------------------------------------------------------------- Physician Orders Details Patient Name: [name]. Date of Service: 08/02/2016 9:30 AM Medical Record Number: [id number] Patient Account Number: [id number] Date of Birth/Sex: 10/06/1970 (45 y.o. Male) Treating RN: Huel Coventry Primary Care Physician: Polo Riley Other Clinician: Referring Physician: Polo Riley Treating Physician/Extender: Rudene Re in Treatment: 2 Verbal / Phone Orders: Yes Clinician: Huel Coventry Read Back and Verified: Yes Diagnosis Coding Wound Cleansing Wound #1 Left,Plantar Metatarsal head fifth o Cleanse wound with mild soap and water o May Shower, gently pat wound dry prior to applying new dressing. o May shower with protection. Primary Wound  Dressing Wound #1 Left,Plantar Metatarsal head fifth o Sorbalgon Ag Secondary Dressing Wound #1 Left,Plantar Metatarsal head fifth o Gauze and Kerlix/Conform Dressing Change Frequency Wound #1 Left,Plantar Metatarsal head fifth o Change dressing every day. Follow-up Appointments Wound #1 Left,Plantar Metatarsal head fifth o Return Appointment in 1 week. Off-Loading Wound #1 Left,Plantar Metatarsal head fifth o Other: - felt/foam Additional Orders / Instructions Wound #1 Left,Plantar Metatarsal head fifth o Increase protein intake. o Activity as tolerated o Other: - ZInc, vit A, C, MVI o Other: - Weigh self daily check with PCP regarding fluid retention and blood sugars Services and Therapies RHYAN, RADLER ([id number]) o DME provider, dressing supplies - Bledsoe walking boot Electronic Signature(s) Signed: 08/02/2016 5:08:19 PM By: Meyer Russel,  Glory BuffErrol MD, FACS Signed: 08/02/2016 5:39:47 PM By: Elliot GurneyWoody, RN, BSN, Kim RN, BSN Entered By: Elliot GurneyWoody, RN, BSN, Kim on 08/02/2016 10:53:55 [name] HaringREESE, Shana S. ([id number]) -------------------------------------------------------------------------------- Prescription 08/02/2016 Patient Name: [name] HaringEESE, Rogelio S. Physician: Evlyn KannerBritto, Deshundra Waller MD Date of Birth: Apr 14, 1971 NPI#: [id number] Sex: Jenetta DownerM DEA#: [id number] Phone #: [phone] License #: Patient Address: [facility] [address] [place], [place] [address] [facility] [address], [address] [place], [place] [address] [phone] Allergies No Known Drug Allergies Physician's Orders o DME provider, dressing supplies - Bledsoe walking boot Signature(s): Date(s): Electronic Signature(s) Signed: 08/02/2016 5:08:19 PM By: Evlyn KannerBritto, Charlane Westry MD, FACS Signed: 08/02/2016 5:39:47 PM By: Elliot GurneyWoody, RN, BSN, Kim RN, BSN Entered By: Elliot GurneyWoody, RN, BSN, Kim on 08/02/2016 10:53:56 Ruppe, [name]  ([id number]) --------------------------------------------------------------------------------  Problem List Details Patient Name: [name] HaringREESE, Phong S. Date of Service: 08/02/2016 9:30 AM Medical Record Number: [id number] Patient Account Number: [id number] Date of Birth/Sex: Apr 14, 1971 [age] y.o. Male) Treating RN: Huel CoventryWoody, Kim Primary Care Physician: Polo RileyGEIGER, PATRICIA Other Clinician: Referring Physician: Polo RileyGEIGER, PATRICIA Treating Physician/Extender: Rudene ReBritto, Vladimir Lenhoff Weeks in Treatment: 2 Active Problems ICD-10 Encounter Code Description Active Date Diagnosis E11.621 Type 2 diabetes mellitus with foot ulcer 07/19/2016 Yes L97.522 Non-pressure chronic ulcer of other part of left foot with fat 07/19/2016 Yes layer exposed E66.3 Overweight 07/19/2016 Yes I87.302 Chronic venous hypertension (idiopathic) without 07/19/2016 Yes complications of left lower extremity Inactive Problems Resolved Problems Electronic Signature(s) Signed: 08/02/2016 11:14:34 AM By: Evlyn KannerBritto, Dimetri Armitage MD, FACS Entered By: Evlyn KannerBritto, Hinda Lindor on 08/02/2016 11:14:34 Grzesiak, [name] ([id number]) -------------------------------------------------------------------------------- Progress Note Details Patient Name: [name] HaringEESE, Prospero S. Date of Service: 08/02/2016 9:30 AM Medical Record Number: [id number] Patient Account Number: [id number] Date of Birth/Sex: Apr 14, 1971 5(45 y.o. Male) Treating RN: Huel CoventryWoody, Kim Primary Care Physician: Polo RileyGEIGER, PATRICIA Other Clinician: Referring Physician: Polo RileyGEIGER, PATRICIA Treating Physician/Extender: Rudene ReBritto, Maddelyn Rocca Weeks in Treatment: 2 Subjective Chief Complaint Information obtained from Patient Patients presents for treatment of an open diabetic ulcer the left plantar foot which she's had for about 2 months History of Present Illness (HPI) The following HPI elements were documented for the patient's wound: Location: left plantar foot Quality: Patient reports No Pain. Severity: Patient states wound (s) are  getting better. Duration: Patient has had the wound for > 2 months prior to seeking treatment at the wound center Context: The wound would happen gradually Modifying Factors: Other treatment(s) tried include:in seeing a podiatrist who is debrided the wound and also put him on Augmentin 45 year old gentleman known to have diabetes mellitus [facility]so sees Dr. Liz Beachodd Klein of podiatry for a left foot ulcer. what I understand he is not very compliant and he has hyperglycemia and is managed with insulin.past medical history is significant for anxiety, back pain, chronic kidney disease stage III, ulcer, peripheral neuropathy, retinopathy, obesity, status post amputation of the left fourth toe,right vein stripping in January 2015. He has never been a smoker. Last hemoglobin A1c was 9.5% and repeat in August 2017 was 8.2% note he's had his left fourth toe amputated in 2009 and at that time also had received hyperbaric oxygen therapy at the wound clinic. He's had laser ablation of his right varicose vein in 2015 and this was done by Dr. Gilda CreaseSchnier 07/23/2016 -- -- x-ray of the left foot -- IMPRESSION: No acute fracture or subluxation. The patient is status post amputation of fourth toe and distal aspect of fourth metatarsal. Again noted plantar and posterior spurring of calcaneus. There is dorsal  spurring tarsal region. No definite evidence of bone destruction to suggest osteomyelitis. 07/26/2016 -- the patient had his total contact cast applied on Monday and he says by Tuesday night he had lost so much of fluid that it got absolutely loose and he was able to slide his leg out of the cast. Not change the dressing and it got pretty moist noted he call us in the office. KIEON, LAWHORN ([id number]) Objective Constitutional Pulse regular. Respirations normal and unlabored. Afebrile. Vitals Time Taken: 10:11 AM, Height: 71 in, Weight: 298 lbs, BMI: 41.6, Temperature: 98.1  F, Pulse: 88 bpm, Respiratory Rate: 16 breaths/min, Blood Pressure: 132/88 mmHg. Eyes Nonicteric. Reactive to light. Ears, Nose, Mouth, and Throat Lips, teeth, and gums WNL.Marland Kitchen Moist mucosa without lesions. Neck supple and nontender. No palpable supraclavicular or cervical adenopathy. Normal sized without goiter. Respiratory WNL. No retractions.. Breath sounds WNL, No rubs, rales, rhonchi, or wheeze.. Cardiovascular Heart rhythm and rate regular, no murmur or gallop.. Pedal Pulses WNL. No clubbing, cyanosis or edema. Lymphatic No adneopathy. No adenopathy. No adenopathy. Musculoskeletal Adexa without tenderness or enlargement.. Digits and nails w/o clubbing, cyanosis, infection, petechiae, ischemia, or inflammatory conditions.Marland Kitchen Psychiatric Judgement and insight Intact.. No evidence of depression, anxiety, or agitation.. General Notes: maceration has gone down significantly but the patient tends to form a lot of callus and are sharply removed the surrounding callus and the subcutaneous debris with a #3 curet and bleeding controlled with pressure. Integumentary (Hair, Skin) No suspicious lesions. No crepitus or fluctuance. No peri-wound warmth or erythema. No masses.. Wound #1 status is Open. Original cause of wound was Gradually Appeared. The wound is located on the Left,Plantar Metatarsal head fifth. The wound measures 0.4cm length x 0.2cm width x 0.3cm depth; 0.063cm^2 area and 0.019cm^3 volume. There is no tunneling noted, however, there is undermining starting at 12:00 and ending at 3:00 with a maximum distance of 0.3cm. There is a medium amount of serous drainage noted. The wound margin is flat and intact. There is large (67-100%) red granulation within the wound bed. There is a small (1-33%) amount of necrotic tissue within the wound bed including Adherent Slough. The periwound skin appearance exhibited: Callus, Moist. The periwound skin appearance did not Goldberger, Gerome S.  ([id number]) exhibit: Crepitus, Excoriation, Fluctuance, Friable, Induration, Localized Edema, Rash, Scarring, Dry/Scaly, Maceration, Atrophie Blanche, Cyanosis, Ecchymosis, Hemosiderin Staining, Mottled, Pallor, Rubor, Erythema. Periwound temperature was noted as No Abnormality. Assessment Active Problems ICD-10 E11.621 - Type 2 diabetes mellitus with foot ulcer L97.522 - Non-pressure chronic ulcer of other part of left foot with fat layer exposed E66.3 - Overweight I87.302 - Chronic venous hypertension (idiopathic) without complications of left lower extremity Procedures Wound #1 Wound #1 is a Diabetic Wound/Ulcer of the Lower Extremity located on the Left,Plantar Metatarsal head fifth . There was a Skin/Subcutaneous Tissue Debridement ([id number]) debridement with total area of 0.08 sq cm performed by Evlyn Kanner, MD. with the following instrument(s): Curette to remove Viable and Non-Viable tissue/material including Fibrin/Slough, Skin, Callus, and Subcutaneous after achieving pain control using Other (lidocaine 4%). A time out was conducted at 10:40, prior to the start of the procedure. A Minimum amount of bleeding was controlled with Pressure. The procedure was tolerated well with a pain level of 0 throughout and a pain level of 0 following the procedure. Post Debridement Measurements: 0.4cm length x 0.3cm width x 0.2cm depth; 0.019cm^3 volume. Character of Wound/Ulcer Post Debridement requires further debridement. Severity of Tissue Post Debridement is: Fat layer exposed. Post procedure  Diagnosis Wound #1: Same as Pre-Procedure General Notes: besides a subcutaneous debridement a lot of the surrounding callus was sharply debrided and the edges were nicely saucerized.. Plan Wound Cleansing: Wound #1 Left,Plantar Metatarsal head fifth: Cleanse wound with mild soap and water May Shower, gently pat wound dry prior to applying new dressing. MAKAEL, BUELNA ([id number]) May shower  with protection. Primary Wound Dressing: Wound #1 Left,Plantar Metatarsal head fifth: Sorbalgon Ag Secondary Dressing: Wound #1 Left,Plantar Metatarsal head fifth: Gauze and Kerlix/Conform Dressing Change Frequency: Wound #1 Left,Plantar Metatarsal head fifth: Change dressing every day. Follow-up Appointments: Wound #1 Left,Plantar Metatarsal head fifth: Return Appointment in 1 week. Off-Loading: Wound #1 Left,Plantar Metatarsal head fifth: Other: - felt/foam Additional Orders / Instructions: Wound #1 Left,Plantar Metatarsal head fifth: Increase protein intake. Activity as tolerated Other: - ZInc, vit A, C, MVI Other: - Weigh self daily check with PCP regarding fluid retention and blood sugars Services and Therapies ordered were: DME provider, dressing supplies - Bledsoe walking boot He has not done well with a total contact cast and was able to slide his foot out of the intact cast. He is building up a lot of callus and hence I have recommended a Bledsoe conformer a boot and they will be able to buy this from [employer]. At this stage I have recommended alternate day changing of dressing with silver alginate and a felt ring and to offload with his Darco front off loading shoe. I have asked him to keep [place] informed Monday to Friday 8-5 in case there is any change and if it is off working hours, he should report to the ER immediately. He is urged to take better control of his diet and talk to his PCP about his fluctuation in weight and fluid retention. Electronic Signature(s) Signed: 08/02/2016 11:18:13 AM By: Evlyn Kanner MD, FACS Entered By: Evlyn Kanner on 08/02/2016 11:18:12 Muscatello, WRAY TELANDER ([id number]) SUAN, LINSEY ([id number]) -------------------------------------------------------------------------------- SuperBill Details Patient Name: [name] Date of Service: 08/02/2016 Medical Record Number: [id number] Patient Account Number: [id number] Date  of Birth/Sex: [birth year] (45 y.o. Male) Treating RN: Huel Coventry Primary Care Physician: Polo Riley Other Clinician: Referring Physician: Polo Riley Treating Physician/Extender: Rudene Re in Treatment: 2 Diagnosis Coding ICD-10 Codes Code Description E11.621 Type 2 diabetes mellitus with foot ulcer L97.522 Non-pressure chronic ulcer of other part of left foot with fat layer exposed E66.3 Overweight I87.302 Chronic venous hypertension (idiopathic) without complications of left lower extremity Facility Procedures CPT4: Description Modifier Quantity Code 14103013 11042 - DEB SUBQ TISSUE 20 SQ CM/< 1 ICD-10 Description Diagnosis E11.621 Type 2 diabetes mellitus with foot ulcer L97.522 Non-pressure chronic ulcer of other part of left foot with fat layer exposed  E66.3 Overweight I87.302 Chronic venous hypertension (idiopathic) without complications of left lower extremity Physician Procedures CPT4: Description Modifier Quantity Code 1438887 11042 - WC PHYS SUBQ TISS 20 SQ CM 1 ICD-10 Description Diagnosis E11.621 Type 2 diabetes mellitus with foot ulcer L97.522 Non-pressure chronic ulcer of other part of left foot with fat layer exposed E66.3  Overweight I87.302 Chronic venous hypertension (idiopathic) without complications of left lower extremity Electronic Signature(s) Signed: 08/02/2016 11:18:36 AM By: Evlyn Kanner MD, FACS Dehner, [name] ([id number]) Entered By: Evlyn Kanner on 08/02/2016 11:18:36

## 2016-08-09 ENCOUNTER — Encounter: Payer: BLUE CROSS/BLUE SHIELD | Admitting: Surgery

## 2016-08-09 DIAGNOSIS — E11621 Type 2 diabetes mellitus with foot ulcer: Secondary | ICD-10-CM | POA: Diagnosis not present

## 2016-08-10 NOTE — Progress Notes (Addendum)
ALDIE, SOISSON ([id number]) Visit Report for 08/09/2016 Chief Complaint Document Details Patient Name: [name], [name]. Date of Service: 08/09/2016 1:30 PM Medical Record Number: [id number] Patient Account Number: [id number] Date of Birth/Sex: April 03, 1971 (45 y.o. Male) Treating RN: Phillis Haggis Primary Care Physician: Polo Riley Other Clinician: Referring Physician: Polo Riley Treating Physician/Extender: Rudene Re in Treatment: 3 Information Obtained from: Patient Chief Complaint Patients presents for treatment of an open diabetic ulcer the left plantar foot which she's had for about 2 months Electronic Signature(s) Signed: 08/09/2016 1:41:44 PM By: Evlyn Kanner MD, FACS Entered By: Evlyn Kanner on 08/09/2016 13:41:44 Recktenwald, [name] ([id number]) -------------------------------------------------------------------------------- Debridement Details Patient Name: [name]. Date of Service: 08/09/2016 1:30 PM Medical Record Number: [id number] Patient Account Number: [id number] Date of Birth/Sex: August 17, 1971 (45 y.o. Male) Treating RN: Phillis Haggis Primary Care Physician: Polo Riley Other Clinician: Referring Physician: Polo Riley Treating Physician/Extender: Rudene Re in Treatment: 3 Debridement Performed for Wound #1 Left,Plantar Metatarsal head fifth Assessment: Performed By: Physician Evlyn Kanner, MD Debridement: Debridement Pre-procedure Yes - 13:34 Verification/Time Out Taken: Start Time: 13:35 Pain Control: Lidocaine 4% Topical Solution Level: Skin/Subcutaneous Tissue Total Area Debrided (L x 0.1 (cm) x 0.1 (cm) = 0.01 (cm) W): Tissue and other Viable, Non-Viable, Exudate, Fibrin/Slough, Subcutaneous material debrided: Instrument: Curette Bleeding: Minimum Hemostasis Achieved: Pressure End Time: 13:38 Procedural Pain: 0 Post Procedural Pain: 0 Response to Treatment: Procedure was tolerated well Post  Debridement Measurements of Total Wound Length: (cm) 0.2 Width: (cm) 1 Depth: (cm) 0.2 Volume: (cm) 0.031 Character of Wound/Ulcer Post Requires Further Debridement Debridement: Severity of Tissue Post Debridement: Fat layer exposed Post Procedure Diagnosis Same as Pre-procedure Electronic Signature(s) Signed: 08/09/2016 4:11:06 PM By: Evlyn Kanner MD, FACS Signed: 08/09/2016 4:54:36 PM By: Alejandro Mulling Entered By: Alejandro Mulling on 08/09/2016 13:50:18 Quaintance, [name] ([id number]) [name], [name] ([id number]) -------------------------------------------------------------------------------- HPI Details Patient Name: [name]. Date of Service: 08/09/2016 1:30 PM Medical Record Number: [id number] Patient Account Number: [id number] Date of Birth/Sex: [birth year] (45 y.o. Male) Treating RN: Phillis Haggis Primary Care Physician: Polo Riley Other Clinician: Referring Physician: Polo Riley Treating Physician/Extender: Rudene Re in Treatment: 3 History of Present Illness Location: left plantar foot Quality: Patient reports No Pain. Severity: Patient states wound (s) are getting better. Duration: Patient has had the wound for > 2 months prior to seeking treatment at the wound center Context: The wound would happen gradually Modifying Factors: Other treatment(s) tried include:in seeing a podiatrist who is debrided the wound and also put him on Augmentin HPI Description: 46 year old gentleman known to have diabetes mellitus cc Dr. Mickey Farber at the [facility] sees Dr. Liz Beach of podiatry for a left foot ulcer. what I understand he is not very compliant and he has hyperglycemia and is managed with insulin.past medical history is significant for anxiety, back pain, chronic kidney disease stage III, ulcer, peripheral neuropathy, retinopathy, obesity, status post amputation of the left fourth toe,right vein stripping in January 2015. He has  never been a smoker. Last hemoglobin A1c was 9.5% and repeat in August 2017 was 8.2% note he's had his left fourth toe amputated in 2009 and at that time also had received hyperbaric oxygen therapy at the wound clinic. He's had laser ablation of his right varicose vein in 2015 and this was done by Dr. Gilda Crease 07/23/2016 -- -- x-ray of the left foot -- IMPRESSION: No acute fracture or subluxation. The patient is status post amputation of fourth toe and distal  aspect of fourth metatarsal. Again noted plantar and posterior spurring of calcaneus. There is dorsal spurring tarsal region. No definite evidence of bone destruction to suggest osteomyelitis. 07/26/2016 -- the patient had his total contact cast applied on Monday and he says by Tuesday night he had lost so much of fluid that it got absolutely loose and he was able to slide his leg out of the cast. Not change the dressing and it got pretty moist noted he call us in the office. 08/09/2016 -- he has ordered his Bledsoe conformer boot but not yet received it. He is also unable to wear his compression stockings because of his foot dressing and I believe it'll be worth getting him a pair of juxta light or Juzo compression wraps Electronic Signature(s) Signed: 08/09/2016 1:42:19 PM By: Evlyn Kanner MD, FACS Entered By: Evlyn Kanner on 08/09/2016 13:42:18 [name], [name] ([id number]) -------------------------------------------------------------------------------- Physical Exam Details Patient Name: [name]. Date of Service: 08/09/2016 1:30 PM Medical Record Number: [id number] Patient Account Number: [id number] Date of Birth/Sex: [birth year] (45 y.o. Male) Treating RN: Phillis Haggis Primary Care Physician: Polo Riley Other Clinician: Referring Physician: Polo Riley Treating Physician/Extender: Rudene Re in Treatment: 3 Constitutional . Pulse regular. Respirations normal and unlabored. Afebrile.  . Eyes Nonicteric. Reactive to light. Ears, Nose, Mouth, and Throat Lips, teeth, and gums WNL.Marland Kitchen Moist mucosa without lesions. Neck supple and nontender. No palpable supraclavicular or cervical adenopathy. Normal sized without goiter. Respiratory WNL. No retractions.. Cardiovascular Pedal Pulses WNL. No clubbing, cyanosis or edema. Lymphatic No adneopathy. No adenopathy. No adenopathy. Musculoskeletal Adexa without tenderness or enlargement.. Digits and nails w/o clubbing, cyanosis, infection, petechiae, ischemia, or inflammatory conditions.. Integumentary (Hair, Skin) No suspicious lesions. No crepitus or fluctuance. No peri-wound warmth or erythema. No masses.Marland Kitchen Psychiatric Judgement and insight Intact.. No evidence of depression, anxiety, or agitation.. Notes the wound is looking very much better and after sharply debriding the callus and subcutaneous of the subcutaneous tissue there is a small linear ulceration and no evidence of cellulitis. Electronic Signature(s) Signed: 08/09/2016 1:42:50 PM By: Evlyn Kanner MD, FACS Entered By: Evlyn Kanner on 08/09/2016 13:42:49 [name], [name] ([id number]) -------------------------------------------------------------------------------- Physician Orders Details Patient Name: [name]. Date of Service: 08/09/2016 1:30 PM Medical Record Number: [id number] Patient Account Number: [id number] Date of Birth/Sex: Feb 14, 1971 (45 y.o. Male) Treating RN: Phillis Haggis Primary Care Physician: Polo Riley Other Clinician: Referring Physician: Polo Riley Treating Physician/Extender: Rudene Re in Treatment: 3 Verbal / Phone Orders: Yes ClinicianAshok Cordia, Debi Read Back and Verified: Yes Diagnosis Coding ICD-10 Coding Code Description E11.621 Type 2 diabetes mellitus with foot ulcer L97.522 Non-pressure chronic ulcer of other part of left foot with fat layer exposed I89.0 Lymphedema, not elsewhere  classified I87.302 Chronic venous hypertension (idiopathic) without complications of left lower extremity E66.3 Overweight Wound Cleansing Wound #1 Left,Plantar Metatarsal head fifth o Cleanse wound with mild soap and water o May Shower, gently pat wound dry prior to applying new dressing. o May shower with protection. Primary Wound Dressing Wound #1 Left,Plantar Metatarsal head fifth o Aquacel Ag Secondary Dressing Wound #1 Left,Plantar Metatarsal head fifth o Gauze and Kerlix/Conform Dressing Change Frequency Wound #1 Left,Plantar Metatarsal head fifth o Change dressing every day. Follow-up Appointments Wound #1 Left,Plantar Metatarsal head fifth o Return Appointment in 1 week. Off-Loading Wound #1 Left,Plantar Metatarsal head fifth o Other: - felt/foam [name], [name] S. ([id number]) Additional Orders / Instructions Wound #1 Left,Plantar Metatarsal head fifth o Increase protein intake. o Activity as tolerated   o Other: - ZInc, vit A, C, MVI o Other: - Weigh self daily check with PCP regarding fluid retention and blood sugars Electronic Signature(s) Signed: 08/09/2016 4:11:06 PM By: Evlyn Kanner MD, FACS Signed: 08/09/2016 4:54:36 PM By: Alejandro Mulling Entered By: Alejandro Mulling on 08/09/2016 13:52:05 Stancil, [name] ([id number]) -------------------------------------------------------------------------------- Problem List Details Patient Name: [name]. Date of Service: 08/09/2016 1:30 PM Medical Record Number: [id number] Patient Account Number: [id number] Date of Birth/Sex: 29-Apr-1971 (45 y.o. Male) Treating RN: Phillis Haggis Primary Care Physician: Polo Riley Other Clinician: Referring Physician: Polo Riley Treating Physician/Extender: Rudene Re in Treatment: 3 Active Problems ICD-10 Encounter Code Description Active Date Diagnosis E11.621 Type 2 diabetes mellitus with foot ulcer 07/19/2016 Yes L97.522  Non-pressure chronic ulcer of other part of left foot with fat 07/19/2016 Yes layer exposed I89.0 Lymphedema, not elsewhere classified 08/09/2016 Yes I87.302 Chronic venous hypertension (idiopathic) without 07/19/2016 Yes complications of left lower extremity E66.3 Overweight 07/19/2016 Yes Inactive Problems Resolved Problems Electronic Signature(s) Signed: 08/09/2016 1:41:38 PM By: Evlyn Kanner MD, FACS Entered By: Evlyn Kanner on 08/09/2016 13:41:38 Schwabe, [name] ([id number]) -------------------------------------------------------------------------------- Progress Note Details Patient Name: [name]. Date of Service: 08/09/2016 1:30 PM Medical Record Number: [id number] Patient Account Number: [id number] Date of Birth/Sex: 20-Aug-1971 (45 y.o. Male) Treating RN: Phillis Haggis Primary Care Physician: Polo Riley Other Clinician: Referring Physician: Polo Riley Treating Physician/Extender: Rudene Re in Treatment: 3 Subjective Chief Complaint Information obtained from Patient Patients presents for treatment of an open diabetic ulcer the left plantar foot which she's had for about 2 months History of Present Illness (HPI) The following HPI elements were documented for the patient's wound: Location: left plantar foot Quality: Patient reports No Pain. Severity: Patient states wound (s) are getting better. Duration: Patient has had the wound for > 2 months prior to seeking treatment at the wound center Context: The wound would happen gradually Modifying Factors: Other treatment(s) tried include:in seeing a podiatrist who is debrided the wound and also put him on Augmentin 45 year old gentleman known to have diabetes mellitus cc Dr. Mickey Farber at the [facility] sees Dr. Liz Beach of podiatry for a left foot ulcer. what I understand he is not very compliant and he has hyperglycemia and is managed with insulin.past medical history is  significant for anxiety, back pain, chronic kidney disease stage III, ulcer, peripheral neuropathy, retinopathy, obesity, status post amputation of the left fourth toe,right vein stripping in January 2015. He has never been a smoker. Last hemoglobin A1c was 9.5% and repeat in August 2017 was 8.2% note he's had his left fourth toe amputated in 2009 and at that time also had received hyperbaric oxygen therapy at the wound clinic. He's had laser ablation of his right varicose vein in 2015 and this was done by Dr. Gilda Crease 07/23/2016 -- -- x-ray of the left foot -- IMPRESSION: No acute fracture or subluxation. The patient is status post amputation of fourth toe and distal aspect of fourth metatarsal. Again noted plantar and posterior spurring of calcaneus. There is dorsal spurring tarsal region. No definite evidence of bone destruction to suggest osteomyelitis. 07/26/2016 -- the patient had his total contact cast applied on Monday and he says by Tuesday night he had lost so much of fluid that it got absolutely loose and he was able to slide his leg out of the cast. Not change the dressing and it got pretty moist noted he call us in the office. 08/09/2016 -- he has ordered his [employer]  conformer boot but not yet received it. He is also unable to wear his compression stockings because of his foot dressing and I believe it'll be worth getting him a pair of juxta light or Juzo compression wraps [name], [name] S. ([id number]) Objective Constitutional Pulse regular. Respirations normal and unlabored. Afebrile. Vitals Time Taken: 1:23 PM, Height: 71 in, Weight: 298 lbs, BMI: 41.6, Temperature: 97.7 F, Pulse: 98 bpm, Respiratory Rate: 18 breaths/min, Blood Pressure: 143/76 mmHg. Eyes Nonicteric. Reactive to light. Ears, Nose, Mouth, and Throat Lips, teeth, and gums WNL.Marland Kitchen. Moist mucosa without lesions. Neck supple and nontender. No palpable supraclavicular or cervical adenopathy. Normal sized without  goiter. Respiratory WNL. No retractions.. Cardiovascular Pedal Pulses WNL. No clubbing, cyanosis or edema. Lymphatic No adneopathy. No adenopathy. No adenopathy. Musculoskeletal Adexa without tenderness or enlargement.. Digits and nails w/o clubbing, cyanosis, infection, petechiae, ischemia, or inflammatory conditions.Marland Kitchen. Psychiatric Judgement and insight Intact.. No evidence of depression, anxiety, or agitation.. General Notes: the wound is looking very much better and after sharply debriding the callus and subcutaneous of the subcutaneous tissue there is a small linear ulceration and no evidence of cellulitis. Integumentary (Hair, Skin) No suspicious lesions. No crepitus or fluctuance. No peri-wound warmth or erythema. No masses.. Wound #1 status is Open. Original cause of wound was Gradually Appeared. The wound is located on the Left,Plantar Metatarsal head fifth. The wound measures 0.1cm length x 0.1cm width x 0.1cm depth; 0.008cm^2 area and 0.001cm^3 volume. There is no tunneling or undermining noted. There is a medium amount of serous drainage noted. The wound margin is flat and intact. There is no granulation within the wound bed. There is a large (67-100%) amount of necrotic tissue within the wound bed including Eschar and Adherent Slough. The periwound skin appearance exhibited: Callus, Moist. The periwound skin [name], [name] S. ([id number]) appearance did not exhibit: Crepitus, Excoriation, Fluctuance, Friable, Induration, Localized Edema, Rash, Scarring, Dry/Scaly, Maceration, Atrophie Blanche, Cyanosis, Ecchymosis, Hemosiderin Staining, Mottled, Pallor, Rubor, Erythema. Periwound temperature was noted as No Abnormality. Assessment Active Problems ICD-10 E11.621 - Type 2 diabetes mellitus with foot ulcer L97.522 - Non-pressure chronic ulcer of other part of left foot with fat layer exposed I89.0 - Lymphedema, not elsewhere classified I87.302 - Chronic venous hypertension  (idiopathic) without complications of left lower extremity E66.3 - Overweight Procedures Wound #1 Wound #1 is a Diabetic Wound/Ulcer of the Lower Extremity located on the Left,Plantar Metatarsal head fifth . There was a Skin/Subcutaneous Tissue Debridement ([id number]) debridement with total area of 0.01 sq cm performed by Evlyn KannerBritto, Elma Shands, MD. with the following instrument(s): Curette to remove Viable and Non-Viable tissue/material including Exudate, Fibrin/Slough, and Subcutaneous after achieving pain control using Lidocaine 4% Topical Solution. A time out was conducted at 13:34, prior to the start of the procedure. A Minimum amount of bleeding was controlled with Pressure. The procedure was tolerated well with a pain level of 0 throughout and a pain level of 0 following the procedure. Post Debridement Measurements: 0.2cm length x 1cm width x 0.2cm depth; 0.031cm^3 volume. Character of Wound/Ulcer Post Debridement requires further debridement. Severity of Tissue Post Debridement is: Fat layer exposed. Post procedure Diagnosis Wound #1: Same as Pre-Procedure Plan Wound Cleansing: Wound #1 Left,Plantar Metatarsal head fifth: Cleanse wound with mild soap and water May Shower, gently pat wound dry prior to applying new dressing. [name] HaringREESE, Loel S. ([id number]) May shower with protection. Primary Wound Dressing: Wound #1 Left,Plantar Metatarsal head fifth: Aquacel Ag Secondary Dressing: Wound #1 Left,Plantar Metatarsal head fifth: Gauze and Kerlix/Conform Dressing  Change Frequency: Wound #1 Left,Plantar Metatarsal head fifth: Change dressing every day. Follow-up Appointments: Wound #1 Left,Plantar Metatarsal head fifth: Return Appointment in 1 week. Off-Loading: Wound #1 Left,Plantar Metatarsal head fifth: Other: - felt/foam Additional Orders / Instructions: Wound #1 Left,Plantar Metatarsal head fifth: Increase protein intake. Activity as tolerated Other: - ZInc, vit A, C,  MVI Other: - Weigh self daily check with PCP regarding fluid retention and blood sugars At this stage I have recommended alternate day changing of dressing with silver alginate and a felt ring and to offload with his Darco front off loading shoe. he will start using his Bledsoe conformer boot as soon as it is available I've also recommended juxta light or Juzo compression wraps as he has got significant lymphedema of the left lower extremity and is unable to wear his compression stockings. He will see [place] back after the holidays Electronic Signature(s) Signed: 08/09/2016 4:13:03 PM By: Evlyn Kanner MD, FACS Previous Signature: 08/09/2016 1:43:53 PM Version By: Evlyn Kanner MD, FACS Entered By: Evlyn Kanner on 08/09/2016 16:13:03 [name], [name] ([id number]) -------------------------------------------------------------------------------- SuperBill Details Patient Name: [name]. Date of Service: 08/09/2016 Medical Record Number: [id number] Patient Account Number: [id number] Date of Birth/Sex: 1970-11-24 (45 y.o. Male) Treating RN: Phillis Haggis Primary Care Physician: Polo Riley Other Clinician: Referring Physician: Polo Riley Treating Physician/Extender: Rudene Re in Treatment: 3 Diagnosis Coding ICD-10 Codes Code Description E11.621 Type 2 diabetes mellitus with foot ulcer L97.522 Non-pressure chronic ulcer of other part of left foot with fat layer exposed I89.0 Lymphedema, not elsewhere classified I87.302 Chronic venous hypertension (idiopathic) without complications of left lower extremity E66.3 Overweight Facility Procedures CPT4: Description Modifier Quantity Code [id number] 11042 - DEB SUBQ TISSUE 20 SQ CM/< 1 ICD-10 Description Diagnosis E11.621 Type 2 diabetes mellitus with foot ulcer L97.522 Non-pressure chronic ulcer of other part of left foot with fat layer exposed  I89.0 Lymphedema, not elsewhere classified I87.302 Chronic venous  hypertension (idiopathic) without complications of left lower extremity Physician Procedures CPT4: Description Modifier Quantity Code [id number] 99213 - WC PHYS LEVEL 3 - EST PT 1 ICD-10 Description Diagnosis E11.621 Type 2 diabetes mellitus with foot ulcer L97.522 Non-pressure chronic ulcer of other part of left foot with fat layer exposed I89.0  Lymphedema, not elsewhere classified I87.302 Chronic venous hypertension (idiopathic) without complications of left lower extremity CPT4: [id number] 11042 - WC PHYS SUBQ TISS 20 SQ CM 1 ICD-10 Description Diagnosis [name], [name] ([id number]) Electronic Signature(s) Signed: 08/10/2016 4:17:50 PM By: Evlyn Kanner MD, FACS Signed: 08/10/2016 5:15:40 PM By: Elliot Gurney RN, BSN, Kim RN, BSN Previous Signature: 08/09/2016 1:44:15 PM Version By: Evlyn Kanner MD, FACS Entered By: Elliot Gurney RN, BSN, Kim on 08/09/2016 17:11:22

## 2016-08-10 NOTE — Progress Notes (Addendum)
[name] HaringREESE, Rad S. ([id number]) Visit Report for 08/09/2016 Arrival Information Details Patient Name: [name] HaringREESE, [name] S. Date of Service: 08/09/2016 1:30 PM Medical Record Number: [id number] Patient Account Number: [id number] Date of Birth/Sex: 06-24-1971 [age] y.o. Male) Treating RN: Phillis HaggisPinkerton, Debi Primary Care Physician: Polo RileyGEIGER, PATRICIA Other Clinician: Referring Physician: Polo RileyGEIGER, PATRICIA Treating Physician/Extender: Rudene ReBritto, Errol Weeks in Treatment: 3 Visit Information History Since Last Visit All ordered tests and consults were completed: No Patient Arrived: Ambulatory Added or deleted any medications: No Arrival Time: 13:21 Any new allergies or adverse reactions: No Accompanied By: self Had a fall or experienced change in No Transfer Assistance: None activities of daily living that may affect Patient Identification Verified: Yes risk of falls: Secondary Verification Process Yes Signs or symptoms of abuse/neglect since last No Completed: visito Patient Requires Transmission-Based No Hospitalized since last visit: No Precautions: Pain Present Now: Yes Patient Has Alerts: Yes Patient Alerts: DMII Electronic Signature(s) Signed: 08/09/2016 4:54:36 PM By: Alejandro MullingPinkerton, Debra Entered By: Alejandro MullingPinkerton, Debra on 08/09/2016 13:21:44 Kestenbaum, [name] ([id number]) -------------------------------------------------------------------------------- Encounter Discharge Information Details Patient Name: [name] HaringEESE, Kaedyn S. Date of Service: 08/09/2016 1:30 PM Medical Record Number: [id number] Patient Account Number: [id number] Date of Birth/Sex: 06-24-1971 [age] y.o. Male) Treating RN: Phillis HaggisPinkerton, Debi Primary Care Physician: Polo RileyGEIGER, PATRICIA Other Clinician: Referring Physician: Polo RileyGEIGER, PATRICIA Treating Physician/Extender: Rudene ReBritto, Errol Weeks in Treatment: 3 Encounter Discharge Information Items Discharge Pain Level: 0 Discharge Condition: Stable Ambulatory Status: Ambulatory Discharge  Destination: Home Transportation: Private Auto Accompanied By: self Schedule Follow-up Appointment: Yes Medication Reconciliation completed and provided to Patient/Care Yes Tehillah Cipriani: Provided on Clinical Summary of Care: 08/09/2016 Form Type Recipient Paper Patient RR Electronic Signature(s) Signed: 08/09/2016 4:54:36 PM By: Alejandro MullingPinkerton, Debra Previous Signature: 08/09/2016 1:52:35 PM Version By: Gwenlyn PerkingMoore, Shelia Entered By: Alejandro MullingPinkerton, Debra on 08/09/2016 13:53:04 Mungo, [name] ([id number]) -------------------------------------------------------------------------------- Lower Extremity Assessment Details Patient Name: [name] HaringEESE, Franko S. Date of Service: 08/09/2016 1:30 PM Medical Record Number: [id number] Patient Account Number: [id number] Date of Birth/Sex: 06-24-1971 [age] y.o. Male) Treating RN: Phillis HaggisPinkerton, Debi Primary Care Physician: Polo RileyGEIGER, PATRICIA Other Clinician: Referring Physician: Polo RileyGEIGER, PATRICIA Treating Physician/Extender: Rudene ReBritto, Errol Weeks in Treatment: 3 Edema Assessment Assessed: [Left: No] [Right: No] E[Left: dema] [Right: :] Calf Left: Right: Point of Measurement: 36 cm From Medial Instep 51.6 cm cm Ankle Left: Right: Point of Measurement: 12 cm From Medial Instep 32.8 cm cm Vascular Assessment Pulses: Posterior Tibial Dorsalis Pedis Palpable: [Left:Yes] Extremity colors, hair growth, and conditions: Extremity Color: [Left:Hyperpigmented] Temperature of Extremity: [Left:Warm] Capillary Refill: [Left:< 3 seconds] Toe Nail Assessment Left: Right: Thick: Yes Discolored: Yes Deformed: Yes Improper Length and Hygiene: No Notes heel to knee- 51.5 cm Electronic Signature(s) Signed: 08/20/2016 4:53:59 PM By: Alejandro MullingPinkerton, Debra Previous Signature: 08/09/2016 4:54:36 PM Version By: Alejandro MullingPinkerton, Debra Entered By: Alejandro MullingPinkerton, Debra on 08/20/2016 10:53:48 Heasley, [name] ([id number]) Parton, [name]  ([id number]) -------------------------------------------------------------------------------- Pain Assessment Details Patient Name: [name] HaringEESE, Loyed S. Date of Service: 08/09/2016 1:30 PM Medical Record Number: [id number] Patient Account Number: [id number] Date of Birth/Sex: 06-24-1971 [age] y.o. Male) Treating RN: Phillis HaggisPinkerton, Debi Primary Care Physician: Polo RileyGEIGER, PATRICIA Other Clinician: Referring Physician: Polo RileyGEIGER, PATRICIA Treating Physician/Extender: Rudene ReBritto, Errol Weeks in Treatment: 3 Active Problems Location of Pain Severity and Description of Pain Patient Has Paino Yes Site Locations Pain Location: Pain in Ulcers With Dressing Change: Yes Duration of the Pain. Constant / Intermittento Constant Rate the pain. Current Pain Level: 3 Worst Pain Level: 4 Least Pain Level: 2 Character of Pain Describe the Pain: Throbbing Pain Management and Medication Current Pain Management:  Electronic Signature(s) Signed: 08/09/2016 4:54:36 PM By: Alejandro Mulling Entered By: Alejandro Mulling on 08/09/2016 13:23:22 Cavagnaro, [name] ([id number]) -------------------------------------------------------------------------------- Patient/Caregiver Education Details Patient Name: [name] Date of Service: 08/09/2016 1:30 PM Medical Record Number: [id number] Patient Account Number: [id number] Date of Birth/Gender: [birth year] (45 y.o. Male) Treating RN: Phillis Haggis Primary Care Physician: Polo Riley Other Clinician: Referring Physician: Polo Riley Treating Physician/Extender: Rudene Re in Treatment: 3 Education Assessment Education Provided To: Patient Education Topics Provided Wound/Skin Impairment: Handouts: Other: change dressing as ordered Methods: Demonstration, Explain/Verbal Responses: State content correctly Electronic Signature(s) Signed: 08/09/2016 4:54:36 PM By: Alejandro Mulling Entered By: Alejandro Mulling on 08/09/2016 13:53:17 Antonopoulos, [name]  ([id number]) -------------------------------------------------------------------------------- Wound Assessment Details Patient Name: [name]. Date of Service: 08/09/2016 1:30 PM Medical Record Number: [id number] Patient Account Number: [id number] Date of Birth/Sex: 1971/05/28 (45 y.o. Male) Treating RN: Phillis Haggis Primary Care Physician: Polo Riley Other Clinician: Referring Physician: Polo Riley Treating Physician/Extender: Rudene Re in Treatment: 3 Wound Status Wound Number: 1 Primary Diabetic Wound/Ulcer of the Lower Etiology: Extremity Wound Location: Left Metatarsal head fifth - Plantar Wound Open Status: Wounding Event: Gradually Appeared Comorbid Cataracts, Hypertension, Type II Date Acquired: 01/09/2016 History: Diabetes, Neuropathy Weeks Of Treatment: 3 Clustered Wound: No Pending Amputation On Presentation Photos Photo Uploaded By: Alejandro Mulling on 08/09/2016 14:04:22 Wound Measurements Length: (cm) 0.1 Width: (cm) 0.1 Depth: (cm) 0.1 Area: (cm) 0.008 Volume: (cm) 0.001 % Reduction in Area: 93.2% % Reduction in Volume: 91.7% Epithelialization: None Tunneling: No Undermining: No Wound Description Classification: Grade 2 Wound Margin: Flat and Intact Exudate Amount: Medium Exudate Type: Serous Exudate Color: amber Foul Odor After Cleansing: No Wound Bed Granulation Amount: None Present (0%) Exposed Structure Necrotic Amount: Large (67-100%) Fascia Exposed: No Necrotic Quality: Eschar, Adherent Slough Fat Layer Exposed: No Tendon Exposed: No Muscle Exposed: No Stork, Mort S. ([id number]) Joint Exposed: No Bone Exposed: No Periwound Skin Texture Texture Color No Abnormalities Noted: No No Abnormalities Noted: No Callus: Yes Atrophie Blanche: No Crepitus: No Cyanosis: No Excoriation: No Ecchymosis: No Fluctuance: No Erythema: No Friable: No Hemosiderin Staining: No Induration: No Mottled:  No Localized Edema: No Pallor: No Rash: No Rubor: No Scarring: No Temperature / Pain Moisture Temperature: No Abnormality No Abnormalities Noted: No Dry / Scaly: No Maceration: No Moist: Yes Wound Preparation Ulcer Cleansing: Rinsed/Irrigated with Saline Topical Anesthetic Applied: Other: lidocaine 4%, Electronic Signature(s) Signed: 08/09/2016 4:54:36 PM By: Alejandro Mulling Entered By: Alejandro Mulling on 08/09/2016 13:29:42 Hopple, [name] ([id number]) -------------------------------------------------------------------------------- Vitals Details Patient Name: [name]. Date of Service: 08/09/2016 1:30 PM Medical Record Number: [id number] Patient Account Number: [id number] Date of Birth/Sex: [birth year] (45 y.o. Male) Treating RN: Phillis Haggis Primary Care Physician: Polo Riley Other Clinician: Referring Physician: Polo Riley Treating Physician/Extender: Rudene Re in Treatment: 3 Vital Signs Time Taken: 13:23 Temperature (F): 97.7 Height (in): 71 Pulse (bpm): 98 Weight (lbs): 298 Respiratory Rate (breaths/min): 18 Body Mass Index (BMI): 41.6 Blood Pressure (mmHg): 143/76 Reference Range: 80 - 120 mg / dl Electronic Signature(s) Signed: 08/09/2016 4:54:36 PM By: Alejandro Mulling Entered By: Alejandro Mulling on 08/09/2016 13:23:46

## 2016-08-20 ENCOUNTER — Encounter: Payer: BLUE CROSS/BLUE SHIELD | Admitting: Surgery

## 2016-08-20 DIAGNOSIS — E11621 Type 2 diabetes mellitus with foot ulcer: Secondary | ICD-10-CM | POA: Diagnosis not present

## 2016-08-21 NOTE — Progress Notes (Signed)
ORLA, JOLLIFF ([id number]) Visit Report for 08/20/2016 Arrival Information Details Patient Name: HOYLE, BARKDULL. Date of Service: 08/20/2016 10:45 AM Medical Record Number: [id number] Patient Account Number: [id number] Date of Birth/Sex: 1971-05-27 (45 y.o. Male) Treating RN: Phillis Haggis Primary Care Physician: Polo Riley Other Clinician: Referring Physician: Polo Riley Treating Physician/Extender: Rudene Re in Treatment: 4 Visit Information History Since Last Visit All ordered tests and consults were completed: No Patient Arrived: Ambulatory Added or deleted any medications: No Arrival Time: 10:45 Any new allergies or adverse reactions: No Accompanied By: self Had a fall or experienced change in No Transfer Assistance: None activities of daily living that may affect Patient Identification Verified: Yes risk of falls: Secondary Verification Process Yes Signs or symptoms of abuse/neglect since last No Completed: visito Patient Requires Transmission-Based No Hospitalized since last visit: No Precautions: Pain Present Now: Yes Patient Has Alerts: Yes Patient Alerts: DMII Electronic Signature(s) Signed: 08/20/2016 4:53:13 PM By: Alejandro Mulling Entered By: Alejandro Mulling on 08/20/2016 10:45:50 Kowalski, [name] ([id number]) -------------------------------------------------------------------------------- Encounter Discharge Information Details Patient Name: [name]. Date of Service: 08/20/2016 10:45 AM Medical Record Number: [id number] Patient Account Number: [id number] Date of Birth/Sex: 1971-05-09 (45 y.o. Male) Treating RN: Phillis Haggis Primary Care Physician: Polo Riley Other Clinician: Referring Physician: Polo Riley Treating Physician/Extender: Rudene Re in Treatment: 4 Encounter Discharge Information Items Discharge Pain Level: 0 Discharge Condition: Stable Ambulatory Status: Ambulatory Discharge  Destination: Home Transportation: Private Auto Accompanied By: self Schedule Follow-up Appointment: Yes Medication Reconciliation completed and provided to Patient/Care Yes Elvia Aydin: Provided on Clinical Summary of Care: 08/20/2016 Form Type Recipient Paper Patient RR Electronic Signature(s) Signed: 08/20/2016 11:14:37 AM By: Gwenlyn Perking Entered By: Gwenlyn Perking on 08/20/2016 11:14:36 Vanbeek, [name] ([id number]) -------------------------------------------------------------------------------- Lower Extremity Assessment Details Patient Name: [name]. Date of Service: 08/20/2016 10:45 AM Medical Record Number: [id number] Patient Account Number: [id number] Date of Birth/Sex: [birth year] (45 y.o. Male) Treating RN: Phillis Haggis Primary Care Physician: Polo Riley Other Clinician: Referring Physician: Polo Riley Treating Physician/Extender: Rudene Re in Treatment: 4 Vascular Assessment Pulses: Posterior Tibial Dorsalis Pedis Palpable: [Left:Yes] Extremity colors, hair growth, and conditions: Extremity Color: [Left:Hyperpigmented] Temperature of Extremity: [Left:Warm] Capillary Refill: [Left:< 3 seconds] Toe Nail Assessment Left: Right: Thick: Yes Discolored: Yes Deformed: Yes Improper Length and Hygiene: No Electronic Signature(s) Signed: 08/20/2016 4:53:13 PM By: Alejandro Mulling Entered By: Alejandro Mulling on 08/20/2016 10:48:43 Gallacher, [name] ([id number]) -------------------------------------------------------------------------------- Multi Wound Chart Details Patient Name: [name]. Date of Service: 08/20/2016 10:45 AM Medical Record Number: [id number] Patient Account Number: [id number] Date of Birth/Sex: 1970-10-03 (45 y.o. Male) Treating RN: Phillis Haggis Primary Care Physician: Polo Riley Other Clinician: Referring Physician: Polo Riley Treating Physician/Extender: Rudene Re in Treatment:  4 Vital Signs Height(in): 71 Pulse(bpm): 64 Weight(lbs): 298 Blood Pressure 123/77 (mmHg): Body Mass Index(BMI): 42 Temperature(F): 97.7 Respiratory Rate 18 (breaths/min): Photos: [1:No Photos] [N/A:N/A] Wound Location: [1:Left Metatarsal head fifth - N/A Plantar] Wounding Event: [1:Gradually Appeared] [N/A:N/A] Primary Etiology: [1:Diabetic Wound/Ulcer of N/A the Lower Extremity] Comorbid History: [1:Cataracts, Hypertension, N/A Type II Diabetes, Neuropathy] Date Acquired: [1:01/09/2016] [N/A:N/A] Weeks of Treatment: [1:4] [N/A:N/A] Wound Status: [1:Open] [N/A:N/A] Pending Amputation on Yes [N/A:N/A] Presentation: Measurements L x W x D 0.1x0.1x0.1 [N/A:N/A] (cm) Area (cm) : [1:0.008] [N/A:N/A] Volume (cm) : [1:0.001] [N/A:N/A] % Reduction in Area: [1:93.20%] [N/A:N/A] % Reduction in Volume: 91.70% [N/A:N/A] Classification: [1:Grade 2] [N/A:N/A] Exudate Amount: [1:None Present] [N/A:N/A] Wound Margin: [1:Flat and Intact] [N/A:N/A] Granulation Amount: [1:None Present (0%)] [  N/A:N/A] Necrotic Amount: [1:Large (67-100%)] [N/A:N/A] Necrotic Tissue: [1:Eschar, Adherent Slough N/A] Exposed Structures: [1:Fascia: No Fat: No Tendon: No Muscle: No] [N/A:N/A] Joint: No Bone: No Epithelialization: None N/A N/A Periwound Skin Texture: Callus: Yes N/A N/A Edema: No Excoriation: No Induration: No Crepitus: No Fluctuance: No Friable: No Rash: No Scarring: No Periwound Skin Moist: Yes N/A N/A Moisture: Maceration: No Dry/Scaly: No Periwound Skin Color: Atrophie Blanche: No N/A N/A Cyanosis: No Ecchymosis: No Erythema: No Hemosiderin Staining: No Mottled: No Pallor: No Rubor: No Temperature: No Abnormality N/A N/A Tenderness on No N/A N/A Palpation: Wound Preparation: Ulcer Cleansing: N/A N/A Rinsed/Irrigated with Saline Topical Anesthetic Applied: Other: lidocaine 4% Treatment Notes Electronic Signature(s) Signed: 08/20/2016 4:53:13 PM By: Alejandro Mulling Entered By: Alejandro Mulling on 08/20/2016 10:55:39 Loss, [name] ([id number]) -------------------------------------------------------------------------------- Multi-Disciplinary Care Plan Details Patient Name: [name]. Date of Service: 08/20/2016 10:45 AM Medical Record Number: [id number] Patient Account Number: [id number] Date of Birth/Sex: 01/16/1971 (45 y.o. Male) Treating RN: Phillis Haggis Primary Care Physician: Polo Riley Other Clinician: Referring Physician: Polo Riley Treating Physician/Extender: Rudene Re in Treatment: 4 Active Inactive Orientation to the Wound Care Program Nursing Diagnoses: Knowledge deficit related to the wound healing center program Goals: Patient/caregiver will verbalize understanding of the Wound Healing Center Program Date Initiated: 07/19/2016 Goal Status: Active Interventions: Provide education on orientation to the wound center Notes: Peripheral Neuropathy Nursing Diagnoses: Knowledge deficit related to disease process and management of peripheral neurovascular dysfunction Potential alteration in peripheral tissue perfusion (select prior to confirmation of diagnosis) Goals: Patient/caregiver will verbalize understanding of disease process and disease management Date Initiated: 07/19/2016 Goal Status: Active Interventions: Assess signs and symptoms of neuropathy upon admission and as needed Provide education on Management of Neuropathy and Related Ulcers Provide education on Management of Neuropathy upon discharge from the Wound Center Screen for HBO Treatment Activities: Consult for HBO : 07/19/2016 Patient referred for customized footwear/offloading : 07/19/2016 Notes: TYDEN, KANN ([id number]) Venous Leg Ulcer Nursing Diagnoses: Actual venous Insuffiency (use after diagnosis is confirmed) Knowledge deficit related to disease process and management Potential for venous Insuffiency (use  before diagnosis confirmed) Goals: Non-invasive venous studies are completed as ordered Date Initiated: 07/19/2016 Goal Status: Active Patient will maintain optimal edema control Date Initiated: 07/19/2016 Goal Status: Active Patient/caregiver will verbalize understanding of disease process and disease management Date Initiated: 07/19/2016 Goal Status: Active Verify adequate tissue perfusion prior to therapeutic compression application Date Initiated: 07/19/2016 Goal Status: Active Interventions: Assess peripheral edema status every visit. Provide education on venous insufficiency Treatment Activities: Non-invasive vascular studies : 07/19/2016 Notes: Wound/Skin Impairment Nursing Diagnoses: Impaired tissue integrity Knowledge deficit related to ulceration/compromised skin integrity Goals: Patient/caregiver will verbalize understanding of skin care regimen Date Initiated: 07/19/2016 Goal Status: Active Ulcer/skin breakdown will have a volume reduction of 30% by week 4 Date Initiated: 07/19/2016 Goal Status: Active Ulcer/skin breakdown will have a volume reduction of 50% by week 8 Date Initiated: 07/19/2016 KODEN, HUNZEKER ([id number]) Goal Status: Active Ulcer/skin breakdown will have a volume reduction of 80% by week 12 Date Initiated: 07/19/2016 Goal Status: Active Ulcer/skin breakdown will heal within 14 weeks Date Initiated: 07/19/2016 Goal Status: Active Interventions: Assess patient/caregiver ability to perform ulcer/skin care regimen upon admission and as needed Assess ulceration(s) every visit Provide education on ulcer and skin care Notes: Electronic Signature(s) Signed: 08/20/2016 4:53:13 PM By: Alejandro Mulling Entered By: Alejandro Mulling on 08/20/2016 10:55:33 Weatherall, [name] ([id number]) -------------------------------------------------------------------------------- Pain Assessment Details Patient Name: [name]. Date  of Service: 08/20/2016  10:45 AM Medical Record Number: [id number] Patient Account Number: [id number] Date of Birth/Sex: [birth year] (45 y.o. Male) Treating RN: Phillis Haggis Primary Care Physician: Polo Riley Other Clinician: Referring Physician: Polo Riley Treating Physician/Extender: Rudene Re in Treatment: 4 Active Problems Location of Pain Severity and Description of Pain Patient Has Paino Yes Site Locations Pain Location: Pain in Ulcers With Dressing Change: Yes Duration of the Pain. Constant / Intermittento Constant Rate the pain. Current Pain Level: 2 Worst Pain Level: 5 Least Pain Level: 1 Character of Pain Describe the Pain: Aching, Tender, Throbbing Pain Management and Medication Current Pain Management: Electronic Signature(s) Signed: 08/20/2016 4:53:13 PM By: Alejandro Mulling Entered By: Alejandro Mulling on 08/20/2016 10:46:20 Olivos, [name] ([id number]) -------------------------------------------------------------------------------- Patient/Caregiver Education Details Patient Name: [name]. Date of Service: 08/20/2016 10:45 AM Medical Record Number: [id number] Patient Account Number: [id number] Date of Birth/Gender: 05-06-1971 (45 y.o. Male) Treating RN: Phillis Haggis Primary Care Physician: Polo Riley Other Clinician: Referring Physician: Polo Riley Treating Physician/Extender: Rudene Re in Treatment: 4 Education Assessment Education Provided To: Patient Education Topics Provided Wound/Skin Impairment: Handouts: Other: change dressing as ordered Methods: Demonstration, Explain/Verbal Responses: State content correctly Electronic Signature(s) Signed: 08/20/2016 4:53:13 PM By: Alejandro Mulling Entered By: Alejandro Mulling on 08/20/2016 11:05:53 Mankin, [name] ([id number]) -------------------------------------------------------------------------------- Wound Assessment Details Patient Name: [name]. Date of  Service: 08/20/2016 10:45 AM Medical Record Number: [id number] Patient Account Number: [id number] Date of Birth/Sex: May 29, 1971 (45 y.o. Male) Treating RN: Phillis Haggis Primary Care Physician: Polo Riley Other Clinician: Referring Physician: Polo Riley Treating Physician/Extender: Rudene Re in Treatment: 4 Wound Status Wound Number: 1 Primary Diabetic Wound/Ulcer of the Lower Etiology: Extremity Wound Location: Left Metatarsal head fifth - Plantar Wound Open Status: Wounding Event: Gradually Appeared Comorbid Cataracts, Hypertension, Type II Date Acquired: 01/09/2016 History: Diabetes, Neuropathy Weeks Of Treatment: 4 Clustered Wound: No Pending Amputation On Presentation Photos Photo Uploaded By: Alejandro Mulling on 08/20/2016 12:12:54 Wound Measurements Length: (cm) 0.1 Width: (cm) 0.1 Depth: (cm) 0.1 Area: (cm) 0.008 Volume: (cm) 0.001 % Reduction in Area: 93.2% % Reduction in Volume: 91.7% Epithelialization: None Tunneling: No Undermining: No Wound Description Classification: Grade 2 Foul Odor Aft Wound Margin: Flat and Intact Exudate Amount: None Present er Cleansing: No Wound Bed Granulation Amount: None Present (0%) Exposed Structure Necrotic Amount: Large (67-100%) Fascia Exposed: No Necrotic Quality: Eschar, Adherent Slough Fat Layer Exposed: No Tendon Exposed: No Bohanon, Crawford S. ([id number]) Muscle Exposed: No Joint Exposed: No Bone Exposed: No Periwound Skin Texture Texture Color No Abnormalities Noted: No No Abnormalities Noted: No Callus: Yes Atrophie Blanche: No Crepitus: No Cyanosis: No Excoriation: No Ecchymosis: No Fluctuance: No Erythema: No Friable: No Hemosiderin Staining: No Induration: No Mottled: No Localized Edema: No Pallor: No Rash: No Rubor: No Scarring: No Temperature / Pain Moisture Temperature: No Abnormality No Abnormalities Noted: No Dry / Scaly: No Maceration: No Moist: Yes Wound  Preparation Ulcer Cleansing: Rinsed/Irrigated with Saline Topical Anesthetic Applied: Other: lidocaine 4%, Treatment Notes Wound #1 (Left, Plantar Metatarsal head fifth) 1. Cleansed with: Clean wound with Normal Saline 2. Anesthetic Topical Lidocaine 4% cream to wound bed prior to debridement 4. Dressing Applied: Prisma Ag 5. Secondary Dressing Applied Dry Gauze Foam Kerlix/Conform 7. Secured with Tape Notes front off loader boot and off-loading foam Electronic Signature(s) Signed: 08/20/2016 4:53:13 PM By: Alejandro Mulling Entered By: Alejandro Mulling on 08/20/2016 10:52:13 Mayol, KAREEN HITSMAN ([id number]) Colburn, [name] ([id number]) -------------------------------------------------------------------------------- Vitals Details Patient Name: Zada Girt  S. Date of Service: 08/20/2016 10:45 AM Medical Record Number: [id number] Patient Account Number: [id number] Date of Birth/Sex: 08/21/1971 1(45 y.o. Male) Treating RN: Phillis HaggisPinkerton, Debi Primary Care Physician: Polo RileyGEIGER, PATRICIA Other Clinician: Referring Physician: Polo RileyGEIGER, PATRICIA Treating Physician/Extender: Rudene ReBritto, Errol Weeks in Treatment: 4 Vital Signs Time Taken: 10:47 Temperature (F): 97.7 Height (in): 71 Pulse (bpm): 64 Weight (lbs): 298 Respiratory Rate (breaths/min): 18 Body Mass Index (BMI): 41.6 Blood Pressure (mmHg): 123/77 Reference Range: 80 - 120 mg / dl Electronic Signature(s) Signed: 08/20/2016 4:53:13 PM By: Alejandro MullingPinkerton, Debra Entered By: Alejandro MullingPinkerton, Debra on 08/20/2016 10:48:16

## 2016-08-21 NOTE — Progress Notes (Signed)
CAPERS, HAGMANN ([id number]) Visit Report for 08/20/2016 Chief Complaint Document Details Patient Name: [name], [name] 08/20/2016 10:45 Date of Service: AM Medical Record [id number] Number: Patient Account Number: [id number] [birth year] (45 y.o. Treating RN: Phillis Haggis Date of Birth/Sex: Male) Other Clinician: Primary Care Physician: Clarisa Kindred, PATRICIA Treating Sandeep Radell Referring Physician: Polo Riley Physician/Extender: Tania Ade in Treatment: 4 Information Obtained from: Patient Chief Complaint Patients presents for treatment of an open diabetic ulcer the left plantar foot which she's had for about 2 months Electronic Signature(s) Signed: 08/20/2016 11:06:52 AM By: Evlyn Kanner MD, FACS Entered By: Evlyn Kanner on 08/20/2016 11:06:52 [name], [name] ([id number]) -------------------------------------------------------------------------------- Debridement Details Patient Name: [name]. 08/20/2016 10:45 Date of Service: AM Medical Record [id number] Number: Patient Account Number: [id number] 1971/09/23 (45 y.o. Treating RN: Phillis Haggis Date of Birth/Sex: Male) Other Clinician: Primary Care Physician: GEIGER, PATRICIA Treating Rhonda Linan Referring Physician: Polo Riley Physician/Extender: Tania Ade in Treatment: 4 Debridement Performed for Wound #1 Left,Plantar Metatarsal head fifth Assessment: Performed By: Physician Evlyn Kanner, MD Debridement: Debridement Pre-procedure Yes - 10:57 Verification/Time Out Taken: Start Time: 10:58 Pain Control: Lidocaine 4% Topical Solution Level: Skin/Subcutaneous Tissue Total Area Debrided (L x 0.1 (cm) x 0.1 (cm) = 0.01 (cm) W): Tissue and other Viable, Non-Viable, Exudate, Fibrin/Slough, Subcutaneous material debrided: Instrument: Curette Bleeding: Minimum Hemostasis Achieved: Pressure End Time: 11:01 Procedural Pain: 0 Post Procedural Pain: 0 Response to Treatment: Procedure was tolerated  well Post Debridement Measurements of Total Wound Length: (cm) 0.3 Width: (cm) 0.4 Depth: (cm) 0.2 Volume: (cm) 0.019 Character of Wound/Ulcer Post Requires Further Debridement Debridement: Severity of Tissue Post Debridement: Fat layer exposed Post Procedure Diagnosis Same as Pre-procedure Notes he had a large callus built over this area and after carefully debriding it with a #3 curet there was a small open ulceration which has healthy granulation tissue. [name], [name] ([id number]) Electronic Signature(s) Signed: 08/20/2016 11:06:46 AM By: Evlyn Kanner MD, FACS Signed: 08/20/2016 4:53:13 PM By: Alejandro Mulling Entered By: Evlyn Kanner on 08/20/2016 11:06:46 [name], [name] ([id number]) -------------------------------------------------------------------------------- HPI Details Patient Name: [name], [name] 08/20/2016 10:45 Date of Service: AM Medical Record [id number] Number: Patient Account Number: [id number] 1971/03/13 (45 y.o. Treating RN: Phillis Haggis Date of Birth/Sex: Male) Other Clinician: Primary Care Physician: GEIGER, PATRICIA Treating Sergey Ishler Referring Physician: Polo Riley Physician/Extender: Tania Ade in Treatment: 4 History of Present Illness Location: left plantar foot Quality: Patient reports No Pain. Severity: Patient states wound (s) are getting better. Duration: Patient has had the wound for > 2 months prior to seeking treatment at the wound center Context: The wound would happen gradually Modifying Factors: Other treatment(s) tried include:in seeing a podiatrist who is debrided the wound and also put him on Augmentin HPI Description: 45 year old gentleman known to have diabetes mellitus cc Dr. Mickey Farber at the [facility] sees Dr. Liz Beach of podiatry for a left foot ulcer. what I understand he is not very compliant and he has hyperglycemia and is managed with insulin.past medical history is significant for anxiety,  back pain, chronic kidney disease stage III, ulcer, peripheral neuropathy, retinopathy, obesity, status post amputation of the left fourth toe,right vein stripping in January 2015. He has never been a smoker. Last hemoglobin A1c was 9.5% and repeat in August 2017 was 8.2% note he's had his left fourth toe amputated in 2009 and at that time also had received hyperbaric oxygen therapy at the wound clinic. He's had laser ablation of his right varicose vein in 2015 and this was  done by Dr. Gilda Crease 07/23/2016 -- -- x-ray of the left foot -- IMPRESSION: No acute fracture or subluxation. The patient is status post amputation of fourth toe and distal aspect of fourth metatarsal. Again noted plantar and posterior spurring of calcaneus. There is dorsal spurring tarsal region. No definite evidence of bone destruction to suggest osteomyelitis. 07/26/2016 -- the patient had his total contact cast applied on Monday and he says by Tuesday night he had lost so much of fluid that it got absolutely loose and he was able to slide his leg out of the cast. Not change the dressing and it got pretty moist noted he call us in the office. 08/09/2016 -- he has ordered his Bledsoe conformer boot but not yet received it. He is also unable to wear his compression stockings because of his foot dressing and I believe it'll be worth getting him a pair of juxta light or Juzo compression wraps 08/20/2016 -- he did get his Bledsoe conformer boot and has been wearing it regularly. However he did not get his compression stocking and has not gone this the entire 10 days. Electronic Signature(s) Signed: 08/20/2016 11:07:19 AM By: Evlyn Kanner MD, FACS Entered By: Evlyn Kanner on 08/20/2016 11:07:18 [name], [name] ([id number]) -------------------------------------------------------------------------------- Physical Exam Details Patient Name: [name], [name] 08/20/2016 10:45 Date of Service: AM Medical  Record [id number] Number: Patient Account Number: [id number] 1971/01/15 (45 y.o. Treating RN: Phillis Haggis Date of Birth/Sex: Male) Other Clinician: Primary Care Physician: GEIGER, PATRICIA Treating Corneshia Hines Referring Physician: Polo Riley Physician/Extender: Weeks in Treatment: 4 Constitutional . Pulse regular. Respirations normal and unlabored. Afebrile. . Eyes Nonicteric. Reactive to light. Ears, Nose, Mouth, and Throat Lips, teeth, and gums WNL.Marland Kitchen Moist mucosa without lesions. Neck supple and nontender. No palpable supraclavicular or cervical adenopathy. Normal sized without goiter. Respiratory WNL. No retractions.. Cardiovascular Pedal Pulses WNL. No clubbing, cyanosis or edema. Lymphatic No adneopathy. No adenopathy. No adenopathy. Musculoskeletal Adexa without tenderness or enlargement.. Digits and nails w/o clubbing, cyanosis, infection, petechiae, ischemia, or inflammatory conditions.. Integumentary (Hair, Skin) No suspicious lesions. No crepitus or fluctuance. No peri-wound warmth or erythema. No masses.Marland Kitchen Psychiatric Judgement and insight Intact.. No evidence of depression, anxiety, or agitation.. Notes he had a large callus built over this area and after carefully debriding it with a #3 curet there was a small open ulceration which has healthy granulation tissue. Electronic Signature(s) Signed: 08/20/2016 11:07:41 AM By: Evlyn Kanner MD, FACS Entered By: Evlyn Kanner on 08/20/2016 11:07:41 [name], [name] ([id number]) -------------------------------------------------------------------------------- Physician Orders Details Patient Name: [name], [name] 08/20/2016 10:45 Date of Service: AM Medical Record [id number] Number: Patient Account Number: [id number] 1970/11/11 (45 y.o. Treating RN: Phillis Haggis Date of Birth/Sex: Male) Other Clinician: Primary Care Physician: GEIGER, PATRICIA Treating Rowin Bayron Referring Physician: Polo Riley Physician/Extender: Tania Ade in Treatment: 4 Verbal / Phone Orders: Yes ClinicianAshok Cordia, Debi Read Back and Verified: Yes Diagnosis Coding Wound Cleansing Wound #1 Left,Plantar Metatarsal head fifth o Cleanse wound with mild soap and water o May Shower, gently pat wound dry prior to applying new dressing. o May shower with protection. Primary Wound Dressing Wound #1 Left,Plantar Metatarsal head fifth o Prisma Ag - moisten with saline Secondary Dressing Wound #1 Left,Plantar Metatarsal head fifth o Gauze and Kerlix/Conform o Foam Dressing Change Frequency Wound #1 Left,Plantar Metatarsal head fifth o Change dressing every day. Follow-up Appointments Wound #1 Left,Plantar Metatarsal head fifth o Return Appointment in 1 week. Off-Loading Wound #1 Left,Plantar Metatarsal head fifth o Other: -  felt/foam Additional Orders / Instructions Wound #1 Left,Plantar Metatarsal head fifth o Increase protein intake. o Activity as tolerated o Other: - ZInc, vit A, C, MVI [name], [name] S. ([id number]) o Other: - Weigh self daily check with PCP regarding fluid retention and blood sugars Electronic Signature(s) Signed: 08/20/2016 4:38:31 PM By: Evlyn Kanner MD, FACS Signed: 08/20/2016 4:53:13 PM By: Alejandro Mulling Entered By: Alejandro Mulling on 08/20/2016 11:03:36 [name], [name] ([id number]) -------------------------------------------------------------------------------- Problem List Details Patient Name: [name], [name] 08/20/2016 10:45 Date of Service: AM Medical Record [id number] Number: Patient Account Number: [id number] 02/17/1971 (45 y.o. Treating RN: Phillis Haggis Date of Birth/Sex: Male) Other Clinician: Primary Care Physician: Clarisa Kindred, PATRICIA Treating Evlyn Kanner Referring Physician: Polo Riley Physician/Extender: Tania Ade in Treatment: 4 Active Problems ICD-10 Encounter Code Description Active Date Diagnosis E11.621  Type 2 diabetes mellitus with foot ulcer 07/19/2016 Yes L97.522 Non-pressure chronic ulcer of other part of left foot with fat 07/19/2016 Yes layer exposed I89.0 Lymphedema, not elsewhere classified 08/09/2016 Yes I87.302 Chronic venous hypertension (idiopathic) without 07/19/2016 Yes complications of left lower extremity E66.3 Overweight 07/19/2016 Yes Inactive Problems Resolved Problems Electronic Signature(s) Signed: 08/20/2016 11:06:19 AM By: Evlyn Kanner MD, FACS Entered By: Evlyn Kanner on 08/20/2016 11:06:18 [name], [name] ([id number]) -------------------------------------------------------------------------------- Progress Note Details Patient Name: [name] 08/20/2016 10:45 Date of Service: AM Medical Record [id number] Number: Patient Account Number: [id number] 27-May-1971 (45 y.o. Treating RN: Phillis Haggis Date of Birth/Sex: Male) Other Clinician: Primary Care Physician: Clarisa Kindred, PATRICIA Treating Kayse Puccini Referring Physician: Polo Riley Physician/Extender: Tania Ade in Treatment: 4 Subjective Chief Complaint Information obtained from Patient Patients presents for treatment of an open diabetic ulcer the left plantar foot which she's had for about 2 months History of Present Illness (HPI) The following HPI elements were documented for the patient's wound: Location: left plantar foot Quality: Patient reports No Pain. Severity: Patient states wound (s) are getting better. Duration: Patient has had the wound for > 2 months prior to seeking treatment at the wound center Context: The wound would happen gradually Modifying Factors: Other treatment(s) tried include:in seeing a podiatrist who is debrided the wound and also put him on Augmentin 45 year old gentleman known to have diabetes mellitus cc Dr. Mickey Farber at the [facility] sees Dr. Liz Beach of podiatry for a left foot ulcer. what I understand he is not very compliant and he  has hyperglycemia and is managed with insulin.past medical history is significant for anxiety, back pain, chronic kidney disease stage III, ulcer, peripheral neuropathy, retinopathy, obesity, status post amputation of the left fourth toe,right vein stripping in January 2015. He has never been a smoker. Last hemoglobin A1c was 9.5% and repeat in August 2017 was 8.2% note he's had his left fourth toe amputated in 2009 and at that time also had received hyperbaric oxygen therapy at the wound clinic. He's had laser ablation of his right varicose vein in 2015 and this was done by Dr. Gilda Crease 07/23/2016 -- -- x-ray of the left foot -- IMPRESSION: No acute fracture or subluxation. The patient is status post amputation of fourth toe and distal aspect of fourth metatarsal. Again noted plantar and posterior spurring of calcaneus. There is dorsal spurring tarsal region. No definite evidence of bone destruction to suggest osteomyelitis. 07/26/2016 -- the patient had his total contact cast applied on Monday and he says by Tuesday night he had lost so much of fluid that it got absolutely loose and he was able to slide his leg out of the cast.  Not change the dressing and it got pretty moist noted he call us in the office. 08/09/2016 -- he has ordered his Bledsoe conformer boot but not yet received it. He is also unable to wear his compression stockings because of his foot dressing and I believe it'll be worth getting him a pair of juxta light or Juzo compression wraps [name], [name] ([id number]) 08/20/2016 -- he did get his Bledsoe conformer boot and has been wearing it regularly. However he did not get his compression stocking and has not gone this the entire 10 days. Objective Constitutional Pulse regular. Respirations normal and unlabored. Afebrile. Vitals Time Taken: 10:47 AM, Height: 71 in, Weight: 298 lbs, BMI: 41.6, Temperature: 97.7 F, Pulse: 64 bpm, Respiratory Rate: 18 breaths/min, Blood  Pressure: 123/77 mmHg. Eyes Nonicteric. Reactive to light. Ears, Nose, Mouth, and Throat Lips, teeth, and gums WNL.Marland Kitchen Moist mucosa without lesions. Neck supple and nontender. No palpable supraclavicular or cervical adenopathy. Normal sized without goiter. Respiratory WNL. No retractions.. Cardiovascular Pedal Pulses WNL. No clubbing, cyanosis or edema. Lymphatic No adneopathy. No adenopathy. No adenopathy. Musculoskeletal Adexa without tenderness or enlargement.. Digits and nails w/o clubbing, cyanosis, infection, petechiae, ischemia, or inflammatory conditions.Marland Kitchen Psychiatric Judgement and insight Intact.. No evidence of depression, anxiety, or agitation.. General Notes: he had a large callus built over this area and after carefully debriding it with a #3 curet there was a small open ulceration which has healthy granulation tissue. Integumentary (Hair, Skin) No suspicious lesions. No crepitus or fluctuance. No peri-wound warmth or erythema. No masses.. Wound #1 status is Open. Original cause of wound was Gradually Appeared. The wound is located on the [place]. ([id number]) Left,Plantar Metatarsal head fifth. The wound measures 0.1cm length x 0.1cm width x 0.1cm depth; 0.008cm^2 area and 0.001cm^3 volume. There is no tunneling or undermining noted. There is a none present amount of drainage noted. The wound margin is flat and intact. There is no granulation within the wound bed. There is a large (67-100%) amount of necrotic tissue within the wound bed including Eschar and Adherent Slough. The periwound skin appearance exhibited: Callus, Moist. The periwound skin appearance did not exhibit: Crepitus, Excoriation, Fluctuance, Friable, Induration, Localized Edema, Rash, Scarring, Dry/Scaly, Maceration, Atrophie Blanche, Cyanosis, Ecchymosis, Hemosiderin Staining, Mottled, Pallor, Rubor, Erythema. Periwound temperature was noted as No Abnormality. Assessment Active  Problems ICD-10 E11.621 - Type 2 diabetes mellitus with foot ulcer L97.522 - Non-pressure chronic ulcer of other part of left foot with fat layer exposed I89.0 - Lymphedema, not elsewhere classified I87.302 - Chronic venous hypertension (idiopathic) without complications of left lower extremity E66.3 - Overweight Procedures Wound #1 Wound #1 is a Diabetic Wound/Ulcer of the Lower Extremity located on the Left,Plantar Metatarsal head fifth . There was a Skin/Subcutaneous Tissue Debridement ([id number]) debridement with total area of 0.01 sq cm performed by Evlyn Kanner, MD. with the following instrument(s): Curette to remove Viable and Non-Viable tissue/material including Exudate, Fibrin/Slough, and Subcutaneous after achieving pain control using Lidocaine 4% Topical Solution. A time out was conducted at 10:57, prior to the start of the procedure. A Minimum amount of bleeding was controlled with Pressure. The procedure was tolerated well with a pain level of 0 throughout and a pain level of 0 following the procedure. Post Debridement Measurements: 0.3cm length x 0.4cm width x 0.2cm depth; 0.019cm^3 volume. Character of Wound/Ulcer Post Debridement requires further debridement. Severity of Tissue Post Debridement is: Fat layer exposed. Post procedure Diagnosis Wound #1: Same as Pre-Procedure General Notes: he had  a large callus built over this area and after carefully debriding it with a #3 curet there was a small open ulceration which has healthy granulation tissue.Marland Kitchen. [name], Alfonzo S. ([id number]) Plan Wound Cleansing: Wound #1 Left,Plantar Metatarsal head fifth: Cleanse wound with mild soap and water May Shower, gently pat wound dry prior to applying new dressing. May shower with protection. Primary Wound Dressing: Wound #1 Left,Plantar Metatarsal head fifth: Prisma Ag - moisten with saline Secondary Dressing: Wound #1 Left,Plantar Metatarsal head fifth: Gauze and  Kerlix/Conform Foam Dressing Change Frequency: Wound #1 Left,Plantar Metatarsal head fifth: Change dressing every day. Follow-up Appointments: Wound #1 Left,Plantar Metatarsal head fifth: Return Appointment in 1 week. Off-Loading: Wound #1 Left,Plantar Metatarsal head fifth: Other: - felt/foam Additional Orders / Instructions: Wound #1 Left,Plantar Metatarsal head fifth: Increase protein intake. Activity as tolerated Other: - ZInc, vit A, C, MVI Other: - Weigh self daily check with PCP regarding fluid retention and blood sugars At this stage I have recommended alternate day changing of dressing with silver collagen and a felt ring and to offload with his Bledsoe conformer boot. I've also recommended juxta light or Juzo compression wraps as he has got significant lymphedema of the left lower extremity and is unable to wear his compression stockings. He will see us back next week and I anticipate discharge. Electronic Signature(s) Signed: 08/20/2016 11:09:35 AM By: Evlyn KannerBritto, Park Beck MD, FACS Entered By: Evlyn KannerBritto, Marshaun Lortie on 08/20/2016 11:09:35 Hommel, [name] PouROBERT S. ([id number]) [name], Olaoluwa S. ([id number]) -------------------------------------------------------------------------------- SuperBill Details Patient Name: [name] HaringEESE, Jakhi S. Date of Service: 08/20/2016 Medical Record Number: [id number] Patient Account Number: [id number] Date of Birth/Sex: 05-19-1971 42(45 y.o. Male) Treating RN: Phillis HaggisPinkerton, Debi Primary Care Physician: Polo RileyGEIGER, PATRICIA Other Clinician: Referring Physician: Polo RileyGEIGER, PATRICIA Treating Physician/Extender: Rudene ReBritto, Stiles Maxcy Weeks in Treatment: 4 Diagnosis Coding ICD-10 Codes Code Description E11.621 Type 2 diabetes mellitus with foot ulcer L97.522 Non-pressure chronic ulcer of other part of left foot with fat layer exposed I89.0 Lymphedema, not elsewhere classified I87.302 Chronic venous hypertension (idiopathic) without complications of left lower extremity E66.3  Overweight Facility Procedures CPT4: Description Modifier Quantity Code [id number] 11042 - DEB SUBQ TISSUE 20 SQ CM/< 1 ICD-10 Description Diagnosis E11.621 Type 2 diabetes mellitus with foot ulcer L97.522 Non-pressure chronic ulcer of other part of left foot with fat layer exposed  I89.0 Lymphedema, not elsewhere classified I87.302 Chronic venous hypertension (idiopathic) without complications of left lower extremity Physician Procedures CPT4: Description Modifier Quantity Code [id number] 11042 - WC PHYS SUBQ TISS 20 SQ CM 1 ICD-10 Description Diagnosis E11.621 Type 2 diabetes mellitus with foot ulcer L97.522 Non-pressure chronic ulcer of other part of left foot with fat layer exposed I89.0  Lymphedema, not elsewhere classified I87.302 Chronic venous hypertension (idiopathic) without complications of left lower extremity Electronic Signature(s) Signed: 08/20/2016 11:09:47 AM By: Evlyn KannerBritto, Hermione Havlicek MD, FACS Russell, AullvilleROBERT S. ([id number]) Entered By: Evlyn KannerBritto, Nupur Hohman on 08/20/2016 11:09:47

## 2016-08-27 ENCOUNTER — Encounter: Payer: BLUE CROSS/BLUE SHIELD | Attending: Surgery | Admitting: Surgery

## 2016-08-27 DIAGNOSIS — E1122 Type 2 diabetes mellitus with diabetic chronic kidney disease: Secondary | ICD-10-CM | POA: Diagnosis not present

## 2016-08-27 DIAGNOSIS — F419 Anxiety disorder, unspecified: Secondary | ICD-10-CM | POA: Diagnosis not present

## 2016-08-27 DIAGNOSIS — I87302 Chronic venous hypertension (idiopathic) without complications of left lower extremity: Secondary | ICD-10-CM | POA: Insufficient documentation

## 2016-08-27 DIAGNOSIS — E1140 Type 2 diabetes mellitus with diabetic neuropathy, unspecified: Secondary | ICD-10-CM | POA: Insufficient documentation

## 2016-08-27 DIAGNOSIS — E663 Overweight: Secondary | ICD-10-CM | POA: Insufficient documentation

## 2016-08-27 DIAGNOSIS — L97522 Non-pressure chronic ulcer of other part of left foot with fat layer exposed: Secondary | ICD-10-CM | POA: Diagnosis not present

## 2016-08-27 DIAGNOSIS — I890 Lymphedema, not elsewhere classified: Secondary | ICD-10-CM | POA: Insufficient documentation

## 2016-08-27 DIAGNOSIS — E11621 Type 2 diabetes mellitus with foot ulcer: Secondary | ICD-10-CM | POA: Insufficient documentation

## 2016-08-27 DIAGNOSIS — Z89422 Acquired absence of other left toe(s): Secondary | ICD-10-CM | POA: Diagnosis not present

## 2016-08-27 DIAGNOSIS — N183 Chronic kidney disease, stage 3 (moderate): Secondary | ICD-10-CM | POA: Insufficient documentation

## 2016-08-27 DIAGNOSIS — Z6841 Body Mass Index (BMI) 40.0 and over, adult: Secondary | ICD-10-CM | POA: Insufficient documentation

## 2016-08-28 NOTE — Progress Notes (Signed)
[name], [name] S. ([id number]) Visit Report for 08/27/2016 Arrival Information Details Patient Name: [name], [name] S. Date of Service: 08/27/2016 11:00 AM Medical Record Number: [id number] Patient Account Number: [id number] Date of Birth/Sex: 1970-11-19 71(45 y.o. Male) Treating RN: [name] HaggisPinkerton, [name] Primary Care Physician: [name] RileyGEIGER, PATRICIA Other Clinician: Referring Physician: Polo RileyGEIGER, PATRICIA Treating Physician/Extender: Rudene [name], [name] Weeks in Treatment: 5 Visit Information History Since Last Visit All ordered tests and consults were completed: No Patient Arrived: Ambulatory Added or deleted any medications: No Arrival Time: 11:19 Any new allergies or adverse reactions: No Accompanied By: self Had a fall or experienced change in No Transfer Assistance: None activities of daily living that may affect Patient Identification Verified: Yes risk of falls: Secondary Verification Process Yes Signs or symptoms of abuse/neglect since last No Completed: visito Patient Requires Transmission-Based No Hospitalized since last visit: No Precautions: Pain Present Now: Yes Patient Has Alerts: Yes Patient Alerts: DMII Electronic Signature(s) Signed: 08/27/2016 5:33:36 PM By: [name] MullingPinkerton, [name] Entered By: [name] MullingPinkerton, [name] on 08/27/2016 11:21:06 [name], [name] PouOBERT S. ([id number]) -------------------------------------------------------------------------------- Clinic Level of Care Assessment Details Patient Name: [name] HaringEESE, Dnaiel S. Date of Service: 08/27/2016 11:00 AM Medical Record Number: [id number] Patient Account Number: [id number] Date of Birth/Sex: 1970-11-19 46(45 y.o. Male) Treating RN: [name] HaggisPinkerton, [name] Primary Care Physician: [name] RileyGEIGER, PATRICIA Other Clinician: Referring Physician: Polo RileyGEIGER, PATRICIA Treating Physician/Extender: Rudene [name], [name] Weeks in Treatment: 5 Clinic Level of Care Assessment Items TOOL 4 Quantity Score X - Use when only an EandM is performed on FOLLOW-UP visit 1 0 ASSESSMENTS -  Nursing Assessment / Reassessment X - Reassessment of Co-morbidities (includes updates in patient status) 1 10 X - Reassessment of Adherence to Treatment Plan 1 5 ASSESSMENTS - Wound and Skin Assessment / Reassessment X - Simple Wound Assessment / Reassessment - one wound 1 5 []  - Complex Wound Assessment / Reassessment - multiple wounds 0 []  - Dermatologic / Skin Assessment (not related to wound area) 0 ASSESSMENTS - Focused Assessment []  - Circumferential Edema Measurements - multi extremities 0 []  - Nutritional Assessment / Counseling / Intervention 0 []  - Lower Extremity Assessment (monofilament, tuning fork, pulses) 0 []  - Peripheral Arterial Disease Assessment (using hand held doppler) 0 ASSESSMENTS - Ostomy and/or Continence Assessment and Care []  - Incontinence Assessment and Management 0 []  - Ostomy Care Assessment and Management (repouching, etc.) 0 PROCESS - Coordination of Care X - Simple Patient / Family Education for ongoing care 1 15 []  - Complex (extensive) Patient / Family Education for ongoing care 0 X - Staff obtains ChiropractorConsents, Records, Test Results / Process Orders 1 10 []  - Staff telephones HHA, Nursing Homes / Clarify orders / etc 0 []  - Routine Transfer to another Facility (non-emergent condition) 0 [name], [name] PouROBERT S. ([id number]) []  - Routine Hospital Admission (non-emergent condition) 0 []  - New Admissions / Manufacturing engineernsurance Authorizations / Ordering NPWT, Apligraf, etc. 0 []  - Emergency Hospital Admission (emergent condition) 0 X - Simple Discharge Coordination 1 10 []  - Complex (extensive) Discharge Coordination 0 PROCESS - Special Needs []  - Pediatric / Minor Patient Management 0 []  - Isolation Patient Management 0 []  - Hearing / Language / Visual special needs 0 []  - Assessment of Community assistance (transportation, D/C planning, etc.) 0 []  - Additional assistance / Altered mentation 0 []  - Support Surface(s) Assessment (bed, cushion, seat, etc.) 0 INTERVENTIONS  - Wound Cleansing / Measurement X - Simple Wound Cleansing - one wound 1 5 []  - Complex Wound Cleansing - multiple wounds 0 X - Wound Imaging (photographs - any number of  wounds) 1 5 []  - Wound Tracing (instead of photographs) 0 []  - Simple Wound Measurement - one wound 0 []  - Complex Wound Measurement - multiple wounds 0 INTERVENTIONS - Wound Dressings []  - Small Wound Dressing one or multiple wounds 0 []  - Medium Wound Dressing one or multiple wounds 0 []  - Large Wound Dressing one or multiple wounds 0 []  - Application of Medications - topical 0 []  - Application of Medications - injection 0 INTERVENTIONS - Miscellaneous []  - External ear exam 0 [name], [name] S. ([id number]) []  - Specimen Collection (cultures, biopsies, blood, body fluids, etc.) 0 []  - Specimen(s) / Culture(s) sent or taken to Lab for analysis 0 []  - Patient Transfer (multiple staff / Michiel Sites Lift / Similar devices) 0 []  - Simple Staple / Suture removal (25 or less) 0 []  - Complex Staple / Suture removal (26 or more) 0 []  - Hypo / Hyperglycemic Management (close monitor of Blood Glucose) 0 []  - Ankle / Brachial Index (ABI) - do not check if billed separately 0 X - Vital Signs 1 5 Has the patient been seen at the hospital within the last three years: Yes Total Score: 70 Level Of Care: New/Established - Level 2 Electronic Signature(s) Signed: 08/27/2016 5:33:36 PM By: [name] Entered By: [name] on 08/27/2016 11:56:55 [name] ([id number]) -------------------------------------------------------------------------------- Encounter Discharge Information Details Patient Name: [name]. Date of Service: 08/27/2016 11:00 AM Medical Record Number: [id number] Patient Account Number: [id number] Date of Birth/Sex: May 12, 1971 (45 y.o. Male) Treating RN: [name] Primary Care Physician: [name] Other Clinician: Referring Physician: Polo [name] Treating Physician/Extender:  Rudene Re in Treatment: 5 Encounter Discharge Information Items Discharge Pain Level: 0 Discharge Condition: Stable Ambulatory Status: Ambulatory Discharge Destination: Home Transportation: Private Auto Accompanied By: self Schedule Follow-up Appointment: No Medication Reconciliation completed and provided to Patient/Care Yes Suman Trivedi: Provided on Clinical Summary of Care: 08/27/2016 Form Type Recipient Paper Patient RR Electronic Signature(s) Signed: 08/27/2016 11:39:44 AM By: Gwenlyn Perking Entered By: Gwenlyn Perking on 08/27/2016 11:39:44 Kuhl, [name] ([id number]) -------------------------------------------------------------------------------- Lower Extremity Assessment Details Patient Name: [name]. Date of Service: 08/27/2016 11:00 AM Medical Record Number: [id number] Patient Account Number: [id number] Date of Birth/Sex: [birth year] (45 y.o. Male) Treating RN: [name] Primary Care Physician: [name] Other Clinician: Referring Physician: Polo [name] Treating Physician/Extender: Rudene Re in Treatment: 5 Vascular Assessment Pulses: Posterior Tibial Dorsalis Pedis Palpable: [Left:Yes] Extremity colors, hair growth, and conditions: Extremity Color: [Left:Hyperpigmented] Temperature of Extremity: [Left:Warm] Capillary Refill: [Left:< 3 seconds] Toe Nail Assessment Left: Right: Thick: Yes Discolored: Yes Deformed: Yes Improper Length and Hygiene: No Electronic Signature(s) Signed: 08/27/2016 5:33:36 PM By: [name] Entered By: [name] on 08/27/2016 11:21:55 Stillinger, [name] ([id number]) -------------------------------------------------------------------------------- Multi Wound Chart Details Patient Name: [name]. Date of Service: 08/27/2016 11:00 AM Medical Record Number: [id number] Patient Account Number: [id number] Date of Birth/Sex: [birth year] (45 y.o. Male) Treating RN: [name] Primary Care Physician: [name] Other Clinician: Referring Physician: Polo [name] Treating Physician/Extender: Rudene Re in Treatment: 5 Wound Assessments Treatment Notes Electronic Signature(s) Signed: 08/27/2016 5:33:36 PM By: [name] Entered By: [name] on 08/27/2016 11:30:59 Heidler, [name] ([id number]) -------------------------------------------------------------------------------- Multi-Disciplinary Care Plan Details Patient Name: [name] Date of Service: 08/27/2016 11:00 AM Medical Record Number: [id number] Patient Account Number: [id number] Date of Birth/Sex: Oct 21, 1970 (45 y.o. Male) Treating RN: [name] Primary Care Physician: [name] Other Clinician: Referring Physician: Polo [name] Treating Physician/Extender: Rudene Re in  Treatment: 5 Active Inactive Electronic Signature(s) Signed: 08/27/2016 5:33:36 PM By: [name] Entered By: [name] on 08/27/2016 11:30:52 Dack, [name] ([id number]) -------------------------------------------------------------------------------- Pain Assessment Details Patient Name: [name]. Date of Service: 08/27/2016 11:00 AM Medical Record Number: [id number] Patient Account Number: [id number] Date of Birth/Sex: Apr 24, 1971 (45 y.o. Male) Treating RN: [name] Primary Care Physician: [name] Other Clinician: Referring Physician: Polo [name] Treating Physician/Extender: Rudene Re in Treatment: 5 Active Problems Location of Pain Severity and Description of Pain Patient Has Paino Yes Site Locations Pain Location: Pain in Ulcers With Dressing Change: Yes Duration of the Pain. Constant / Intermittento Constant Rate the pain. Current Pain Level: 5 Worst Pain Level: 5 Least Pain Level: 5 Tolerable Pain Level: 5 Character of Pain Describe the Pain: Aching Pain Management and Medication Current  Pain Management: Electronic Signature(s) Signed: 08/27/2016 5:33:36 PM By: [name] Entered By: [name] on 08/27/2016 11:21:33 Biehl, [name] ([id number]) -------------------------------------------------------------------------------- Patient/Caregiver Education Details Patient Name: [name]. Date of Service: 08/27/2016 11:00 AM Medical Record Number: [id number] Patient Account Number: [id number] Date of Birth/Gender: 1971-09-22 (45 y.o. Male) Treating RN: [name] Primary Care Physician: [name] Other Clinician: Referring Physician: Polo [name] Treating Physician/Extender: Rudene Re in Treatment: 5 Education Assessment Education Provided To: Patient Education Topics Provided Wound/Skin Impairment: Other: Protect area, keep clean and dry. Please call our office if you have any questions or Handouts: concerns. Methods: Explain/Verbal Responses: State content correctly Electronic Signature(s) Signed: 08/27/2016 5:33:36 PM By: [name] Entered By: [name] on 08/27/2016 11:54:02 Swigart, [name] ([id number]) -------------------------------------------------------------------------------- Wound Assessment Details Patient Name: [name]. Date of Service: 08/27/2016 11:00 AM Medical Record Number: [id number] Patient Account Number: [id number] Date of Birth/Sex: Dec 10, 1970 (45 y.o. Male) Treating RN: [name] Primary Care Physician: [name] Other Clinician: Referring Physician: Polo [name] Treating Physician/Extender: Rudene Re in Treatment: 5 Wound Status Wound Number: 1 Primary Diabetic Wound/Ulcer of the Lower Etiology: Extremity Wound Location: Left Metatarsal head fifth - Plantar Wound Open Status: Wounding Event: Gradually Appeared Comorbid Cataracts, Hypertension, Type II Date Acquired: 01/09/2016 History: Diabetes, Neuropathy Weeks Of Treatment: 5 Clustered  Wound: No Pending Amputation On Presentation Photos Photo Uploaded By: [name] on 08/27/2016 11:42:36 Wound Measurements Length: (cm) Width: (cm) Depth: (cm) Area: (cm) Volume: (cm) 0 % Reduction in Area: 100% 0 % Reduction in Volume: 100% 0 Epithelialization: Large (67-100%) 0 Tunneling: No 0 Undermining: No Wound Description Classification: Grade 2 Wound Margin: Flat and Intact Exudate Amount: None Present Foul Odor After Cleansing: No Wound Bed Granulation Amount: None Present (0%) Exposed Structure Necrotic Amount: None Present (0%) Fascia Exposed: No Fat Layer Exposed: No Tendon Exposed: No Starkes, Prospero S. ([id number]) Muscle Exposed: No Joint Exposed: No Bone Exposed: No Periwound Skin Texture Texture Color No Abnormalities Noted: No No Abnormalities Noted: No Callus: Yes Atrophie Blanche: No Crepitus: No Cyanosis: No Excoriation: No Ecchymosis: No Fluctuance: No Erythema: No Friable: No Hemosiderin Staining: No Induration: No Mottled: No Localized Edema: No Pallor: No Rash: No Rubor: No Scarring: No Temperature / Pain Moisture Temperature: No Abnormality No Abnormalities Noted: No Dry / Scaly: No Maceration: No Moist: No Wound Preparation Ulcer Cleansing: Rinsed/Irrigated with Saline Topical Anesthetic Applied: None Electronic Signature(s) Signed: 08/27/2016 5:33:36 PM By: [name] Entered By: [name] on 08/27/2016 11:32:51 Wamboldt, [name] ([id number]) -------------------------------------------------------------------------------- Vitals Details Patient Name: [name]. Date of Service: 08/27/2016 11:00 AM Medical Record Number: [id number] Patient Account Number: [id number] Date of  Birth/Sex: 02-08-1971 (46 y.o. Male) Treating RN: Ashok Cordia, [name] Primary Care Physician: Clarisa Kindred, PATRICIA Other Clinician: Referring Physician: Clarisa Kindred, PATRICIA Treating Physician/Extender: Rudene Re in Treatment:  5 Vital Signs Time Taken: 11:22 Temperature (F): 97.6 Height (in): 71 Pulse (bpm): 68 Weight (lbs): 298 Respiratory Rate (breaths/min): 18 Body Mass Index (BMI): 41.6 Blood Pressure (mmHg): 126/74 Reference Range: 80 - 120 mg / dl Electronic Signature(s) Signed: 08/27/2016 5:33:36 PM By: [name] Entered By: [name] on 08/27/2016 11:56:12

## 2016-08-28 NOTE — Progress Notes (Signed)
[name], [name] S. ([id number]) Visit Report for 08/27/2016 Chief Complaint Document Details Patient Name: [name], [name] S. Date of Service: 08/27/2016 11:00 AM Medical Record Number: [id number] Patient Account Number: [id number] Date of Birth/Sex: 12-21-1970 [age] y.o. Male) Treating RN: Phillis HaggisPinkerton, Debi Primary Care Physician: Polo RileyGEIGER, PATRICIA Other Clinician: Referring Physician: Polo RileyGEIGER, PATRICIA Treating Physician/Extender: Rudene ReBritto, Roxsana Riding Weeks in Treatment: 5 Information Obtained from: Patient Chief Complaint Patients presents for treatment of an open diabetic ulcer the left plantar foot which she's had for about 2 months Electronic Signature(s) Signed: 08/27/2016 11:35:20 AM By: Evlyn KannerBritto, Shaniece Bussa MD, FACS Entered By: Evlyn KannerBritto, Sharvil Hoey on 08/27/2016 11:35:20 [name], [name] ([id number]) -------------------------------------------------------------------------------- HPI Details Patient Name: [name], [name] S. Date of Service: 08/27/2016 11:00 AM Medical Record Number: [id number] Patient Account Number: [id number] Date of Birth/Sex: 12-21-1970 [age] y.o. Male) Treating RN: Phillis HaggisPinkerton, Debi Primary Care Physician: Polo RileyGEIGER, PATRICIA Other Clinician: Referring Physician: Polo RileyGEIGER, PATRICIA Treating Physician/Extender: Rudene ReBritto, Mylasia Vorhees Weeks in Treatment: 5 History of Present Illness Location: left plantar foot Quality: Patient reports No Pain. Severity: Patient states wound (s) are getting better. Duration: Patient has had the wound for > 2 months prior to seeking treatment at the wound center Context: The wound would happen gradually Modifying Factors: Other treatment(s) tried include:in seeing a podiatrist who is debrided the wound and also put him on Augmentin HPI Description: 45 year old gentleman known to have diabetes mellitus cc [facility]so sees Dr. Liz Beachodd Klein of podiatry for a left foot ulcer. what I understand he is not very compliant and he has hyperglycemia and  is managed with insulin.past medical history is significant for anxiety, back pain, chronic kidney disease stage III, ulcer, peripheral neuropathy, retinopathy, obesity, status post amputation of the left fourth toe,right vein stripping in January 2015. He has never been a smoker. Last hemoglobin A1c was 9.5% and repeat in August 2017 was 8.2% note he's had his left fourth toe amputated in 2009 and at that time also had received hyperbaric oxygen therapy at the wound clinic. He's had laser ablation of his right varicose vein in 2015 and this was done by Dr. Gilda CreaseSchnier 07/23/2016 -- -- x-ray of the left foot -- IMPRESSION: No acute fracture or subluxation. The patient is status post amputation of fourth toe and distal aspect of fourth metatarsal. Again noted plantar and posterior spurring of calcaneus. There is dorsal spurring tarsal region. No definite evidence of bone destruction to suggest osteomyelitis. 07/26/2016 -- the patient had his total contact cast applied on Monday and he says by Tuesday night he had lost so much of fluid that it got absolutely loose and he was able to slide his leg out of the cast. Not change the dressing and it got pretty moist noted he call us in the office. 08/09/2016 -- he has ordered his Bledsoe conformer boot but not yet received it. He is also unable to wear his compression stockings because of his foot dressing and I believe it'll be worth getting him a pair of juxta light or Juzo compression wraps 08/20/2016 -- he did get his Bledsoe conformer boot and has been wearing it regularly. However he did not get his compression stocking and has not gone this the entire 10 days. Electronic Signature(s) Signed: 08/27/2016 11:35:38 AM By: Evlyn KannerBritto, Kinta Martis MD, FACS Entered By: Evlyn KannerBritto, Karl Erway on 08/27/2016 11:35:37 [name], [name] ([id number]) -------------------------------------------------------------------------------- Physical Exam Details Patient Name: [name], [name]  S. Date of Service: 08/27/2016 11:00 AM Medical Record Number: [id number] Patient Account Number: [id number] Date of Birth/Sex:  1971/05/27 (45 y.o. Male) Treating RN: Ashok Cordia, Debi Primary Care Physician: Polo Riley Other Clinician: Referring Physician: Clarisa Kindred, PATRICIA Treating Physician/Extender: Rudene Re in Treatment: 5 Constitutional . Pulse regular. Respirations normal and unlabored. Afebrile. . Eyes Nonicteric. Reactive to light. Ears, Nose, Mouth, and Throat Lips, teeth, and gums WNL.Marland Kitchen Moist mucosa without lesions. Neck supple and nontender. No palpable supraclavicular or cervical adenopathy. Normal sized without goiter. Respiratory WNL. No retractions.. Cardiovascular Pedal Pulses WNL. No clubbing, cyanosis or edema. Lymphatic No adneopathy. No adenopathy. No adenopathy. Musculoskeletal Adexa without tenderness or enlargement.. Digits and nails w/o clubbing, cyanosis, infection, petechiae, ischemia, or inflammatory conditions.. Integumentary (Hair, Skin) No suspicious lesions. No crepitus or fluctuance. No peri-wound warmth or erythema. No masses.Marland Kitchen Psychiatric Judgement and insight Intact.. No evidence of depression, anxiety, or agitation.. Notes the callus on the plantar aspect of his left lateral foot is minimal and there is no open ulceration Electronic Signature(s) Signed: 08/27/2016 11:36:05 AM By: Evlyn Kanner MD, FACS Entered By: Evlyn Kanner on 08/27/2016 11:36:04 Bermingham, [name] ([id number]) -------------------------------------------------------------------------------- Physician Orders Details Patient Name: [name]. Date of Service: 08/27/2016 11:00 AM Medical Record Number: [id number] Patient Account Number: [id number] Date of Birth/Sex: 03-20-1971 (45 y.o. Male) Treating RN: Phillis Haggis Primary Care Physician: Polo Riley Other Clinician: Referring Physician: Polo Riley Treating Physician/Extender: Rudene Re in Treatment: 5 Verbal / Phone Orders: Yes Clinician: Ashok Cordia, Debi Read Back and Verified: Yes Diagnosis Coding Off-Loading o Other: - Please wear boot until you get your diabetic shoes. Additional Orders / Instructions o OK to return to work Discharge From [facility] Services o Discharge from Wound Care Center - Protect area, keep clean and dry. Please contact our office if you have any questions or concerns. Electronic Signature(s) Signed: 08/27/2016 4:26:33 PM By: Evlyn Kanner MD, FACS Signed: 08/27/2016 5:33:36 PM By: Alejandro Mulling Entered By: Alejandro Mulling on 08/27/2016 11:53:33 Deboy, [name] ([id number]) -------------------------------------------------------------------------------- Problem List Details Patient Name: [name]. Date of Service: 08/27/2016 11:00 AM Medical Record Number: [id number] Patient Account Number: [id number] Date of Birth/Sex: 06-22-1971 (45 y.o. Male) Treating RN: Phillis Haggis Primary Care Physician: Polo Riley Other Clinician: Referring Physician: Polo Riley Treating Physician/Extender: Rudene Re in Treatment: 5 Active Problems ICD-10 Encounter Code Description Active Date Diagnosis E11.621 Type 2 diabetes mellitus with foot ulcer 07/19/2016 Yes L97.522 Non-pressure chronic ulcer of other part of left foot with fat 07/19/2016 Yes layer exposed I89.0 Lymphedema, not elsewhere classified 08/09/2016 Yes I87.302 Chronic venous hypertension (idiopathic) without 07/19/2016 Yes complications of left lower extremity E66.3 Overweight 07/19/2016 Yes Inactive Problems Resolved Problems Electronic Signature(s) Signed: 08/27/2016 11:35:03 AM By: Evlyn Kanner MD, FACS Entered By: Evlyn Kanner on 08/27/2016 11:35:02 Rister, [name] ([id number]) -------------------------------------------------------------------------------- Progress Note Details Patient Name: [name]. Date of Service:  08/27/2016 11:00 AM Medical Record Number: [id number] Patient Account Number: [id number] Date of Birth/Sex: 11/28/1970 (45 y.o. Male) Treating RN: Phillis Haggis Primary Care Physician: Polo Riley Other Clinician: Referring Physician: Polo Riley Treating Physician/Extender: Rudene Re in Treatment: 5 Subjective Chief Complaint Information obtained from Patient Patients presents for treatment of an open diabetic ulcer the left plantar foot which she's had for about 2 months History of Present Illness (HPI) The following HPI elements were documented for the patient's wound: Location: left plantar foot Quality: Patient reports No Pain. Severity: Patient states wound (s) are getting better. Duration: Patient has had the wound for > 2 months prior to seeking treatment at the wound center Context: The wound  would happen gradually Modifying Factors: Other treatment(s) tried include:in seeing a podiatrist who is debrided the wound and also put him on Augmentin 45 year old gentleman known to have diabetes mellitus cc Dr. Mickey Farber at the [facility] sees Dr. Liz Beach of podiatry for a left foot ulcer. what I understand he is not very compliant and he has hyperglycemia and is managed with insulin.past medical history is significant for anxiety, back pain, chronic kidney disease stage III, ulcer, peripheral neuropathy, retinopathy, obesity, status post amputation of the left fourth toe,right vein stripping in January 2015. He has never been a smoker. Last hemoglobin A1c was 9.5% and repeat in August 2017 was 8.2% note he's had his left fourth toe amputated in 2009 and at that time also had received hyperbaric oxygen therapy at the wound clinic. He's had laser ablation of his right varicose vein in 2015 and this was done by Dr. Gilda Crease 07/23/2016 -- -- x-ray of the left foot -- IMPRESSION: No acute fracture or subluxation. The patient is status post amputation of  fourth toe and distal aspect of fourth metatarsal. Again noted plantar and posterior spurring of calcaneus. There is dorsal spurring tarsal region. No definite evidence of bone destruction to suggest osteomyelitis. 07/26/2016 -- the patient had his total contact cast applied on Monday and he says by Tuesday night he had lost so much of fluid that it got absolutely loose and he was able to slide his leg out of the cast. Not change the dressing and it got pretty moist noted he call us in the office. 08/09/2016 -- he has ordered his Bledsoe conformer boot but not yet received it. He is also unable to wear his compression stockings because of his foot dressing and I believe it'll be worth getting him a pair of juxta light or Juzo compression wraps 08/20/2016 -- he did get his Bledsoe conformer boot and has been wearing it regularly. However he did not get his compression stocking and has not gone this the entire 10 days. RUBIN, DAIS ([id number]) Objective Constitutional Pulse regular. Respirations normal and unlabored. Afebrile. Vitals Time Taken: 11:22 AM, Height: 71 in, Weight: 298 lbs, BMI: 41.6, Temperature: 97.6 F, Pulse: 68 bpm, Respiratory Rate: 18 breaths/min, Blood Pressure: 126/74 mmHg. Eyes Nonicteric. Reactive to light. Ears, Nose, Mouth, and Throat Lips, teeth, and gums WNL.Marland Kitchen Moist mucosa without lesions. Neck supple and nontender. No palpable supraclavicular or cervical adenopathy. Normal sized without goiter. Respiratory WNL. No retractions.. Cardiovascular Pedal Pulses WNL. No clubbing, cyanosis or edema. Lymphatic No adneopathy. No adenopathy. No adenopathy. Musculoskeletal Adexa without tenderness or enlargement.. Digits and nails w/o clubbing, cyanosis, infection, petechiae, ischemia, or inflammatory conditions.Marland Kitchen Psychiatric Judgement and insight Intact.. No evidence of depression, anxiety, or agitation.. General Notes: the callus on the plantar aspect of his  left lateral foot is minimal and there is no open ulceration Integumentary (Hair, Skin) No suspicious lesions. No crepitus or fluctuance. No peri-wound warmth or erythema. No masses.. Wound #1 status is Open. Original cause of wound was Gradually Appeared. The wound is located on the Left,Plantar Metatarsal head fifth. The wound measures 0cm length x 0cm width x 0cm depth; 0cm^2 area and 0cm^3 volume. There is no tunneling or undermining noted. There is a none present amount of Grounds, Eldrick S. ([id number]) drainage noted. The wound margin is flat and intact. There is no granulation within the wound bed. There is no necrotic tissue within the wound bed. The periwound skin appearance exhibited: Callus. The periwound skin appearance  did not exhibit: Crepitus, Excoriation, Fluctuance, Friable, Induration, Localized Edema, Rash, Scarring, Dry/Scaly, Maceration, Moist, Atrophie Blanche, Cyanosis, Ecchymosis, Hemosiderin Staining, Mottled, Pallor, Rubor, Erythema. Periwound temperature was noted as No Abnormality. Assessment Active Problems ICD-10 E11.621 - Type 2 diabetes mellitus with foot ulcer L97.522 - Non-pressure chronic ulcer of other part of left foot with fat layer exposed I89.0 - Lymphedema, not elsewhere classified I87.302 - Chronic venous hypertension (idiopathic) without complications of left lower extremity E66.3 - Overweight Plan Off-Loading: Other: - Please wear boot until you get your diabetic shoes. Additional Orders / Instructions: OK to return to work Discharge From [facility] Services: Discharge from Wound Care Center - Protect area, keep clean and dry. Please contact our office if you have any questions or concerns. The patient's wound is completely healed and I'm discharging him from the wound care services with the following advice: 1. At this stage I have recommended to offload with his Bledsoe conformer boot. 2. I've also recommended juxta light or Juzo compression wraps  as he has got significant lymphedema of the left lower extremity and is unable to wear his compression stockings. 3. See his vascular doctor regarding left lower extremity varicose vein surgery 4. Get diabetic shoes made appropriately 5. return to see as if any problems continue with the plantar aspect of his left foot KAYLEM, GIDNEY ([id number]) Electronic Signature(s) Signed: 08/27/2016 4:28:52 PM By: Evlyn Kanner MD, FACS Previous Signature: 08/27/2016 11:38:00 AM Version By: Evlyn Kanner MD, FACS Entered By: Evlyn Kanner on 08/27/2016 16:28:52 Kooy, [name] ([id number]) -------------------------------------------------------------------------------- SuperBill Details Patient Name: [name]. Date of Service: 08/27/2016 Medical Record Number: [id number] Patient Account Number: [id number] Date of Birth/Sex: 10/05/1970 (44 y.o. Male) Treating RN: Phillis Haggis Primary Care Physician: Polo Riley Other Clinician: Referring Physician: Polo Riley Treating Physician/Extender: Rudene Re in Treatment: 5 Diagnosis Coding ICD-10 Codes Code Description E11.621 Type 2 diabetes mellitus with foot ulcer L97.522 Non-pressure chronic ulcer of other part of left foot with fat layer exposed I89.0 Lymphedema, not elsewhere classified I87.302 Chronic venous hypertension (idiopathic) without complications of left lower extremity E66.3 Overweight Facility Procedures CPT4 Code: [id number] Description: 41324 - WOUND CARE VISIT-LEV 2 EST PT Modifier: Quantity: 1 Physician Procedures CPT4: Description Modifier Quantity Code [id number] - WC PHYS LEVEL 3 - EST PT 1 ICD-10 Description Diagnosis E11.621 Type 2 diabetes mellitus with foot ulcer L97.522 Non-pressure chronic ulcer of other part of left foot with fat layer exposed I89.0  Lymphedema, not elsewhere classified I87.302 Chronic venous hypertension (idiopathic) without complications of left lower  extremity Electronic Signature(s) Signed: 08/27/2016 4:26:33 PM By: Evlyn Kanner MD, FACS Signed: 08/27/2016 5:33:36 PM By: Alejandro Mulling Previous Signature: 08/27/2016 11:38:13 AM Version By: Evlyn Kanner MD, FACS Entered By: Alejandro Mulling on 08/27/2016 11:57:14

## 2016-12-08 ENCOUNTER — Encounter: Payer: Self-pay | Admitting: Emergency Medicine

## 2016-12-08 DIAGNOSIS — L97429 Non-pressure chronic ulcer of left heel and midfoot with unspecified severity: Secondary | ICD-10-CM | POA: Insufficient documentation

## 2016-12-08 DIAGNOSIS — I10 Essential (primary) hypertension: Secondary | ICD-10-CM | POA: Insufficient documentation

## 2016-12-08 DIAGNOSIS — M79672 Pain in left foot: Secondary | ICD-10-CM | POA: Diagnosis present

## 2016-12-08 LAB — BASIC METABOLIC PANEL
ANION GAP: 8 (ref 5–15)
BUN: 24 mg/dL — ABNORMAL HIGH (ref 6–20)
CALCIUM: 9.1 mg/dL (ref 8.9–10.3)
CO2: 22 mmol/L (ref 22–32)
Chloride: 105 mmol/L (ref 101–111)
Creatinine, Ser: 1.17 mg/dL (ref 0.61–1.24)
Glucose, Bld: 164 mg/dL — ABNORMAL HIGH (ref 65–99)
Potassium: 3.9 mmol/L (ref 3.5–5.1)
SODIUM: 135 mmol/L (ref 135–145)

## 2016-12-08 LAB — CBC WITH DIFFERENTIAL/PLATELET
BASOS ABS: 0 10*3/uL (ref 0–0.1)
BASOS PCT: 1 %
EOS ABS: 0.1 10*3/uL (ref 0–0.7)
Eosinophils Relative: 1 %
HCT: 39.3 % — ABNORMAL LOW (ref 40.0–52.0)
Hemoglobin: 13.8 g/dL (ref 13.0–18.0)
Lymphocytes Relative: 15 %
Lymphs Abs: 1.5 10*3/uL (ref 1.0–3.6)
MCH: 30.7 pg (ref 26.0–34.0)
MCHC: 35.1 g/dL (ref 32.0–36.0)
MCV: 87.5 fL (ref 80.0–100.0)
Monocytes Absolute: 0.5 10*3/uL (ref 0.2–1.0)
Monocytes Relative: 5 %
NEUTROS ABS: 7.5 10*3/uL — AB (ref 1.4–6.5)
Neutrophils Relative %: 78 %
PLATELETS: 221 10*3/uL (ref 150–440)
RBC: 4.49 MIL/uL (ref 4.40–5.90)
RDW: 13.7 % (ref 11.5–14.5)
WBC: 9.6 10*3/uL (ref 3.8–10.6)

## 2016-12-08 NOTE — ED Triage Notes (Signed)
Pt states that he has a diabetic foot ulcer that is currently being treated by Dr. Clide Cliff. Pt states that the pain has been getting worse since Thursday and that he believes that his foot is infected.

## 2016-12-09 ENCOUNTER — Emergency Department
Admission: EM | Admit: 2016-12-09 | Discharge: 2016-12-09 | Disposition: A | Discharge status: Home / Self Care | Payer: BLUE CROSS/BLUE SHIELD | Attending: Student in an Organized Health Care Education/Training Program | Admitting: Student in an Organized Health Care Education/Training Program

## 2016-12-09 ENCOUNTER — Emergency Department: Payer: BLUE CROSS/BLUE SHIELD

## 2016-12-09 DIAGNOSIS — L97429 Non-pressure chronic ulcer of left heel and midfoot with unspecified severity: Secondary | ICD-10-CM

## 2016-12-09 DIAGNOSIS — M79672 Pain in left foot: Secondary | ICD-10-CM

## 2016-12-09 DIAGNOSIS — E11621 Type 2 diabetes mellitus with foot ulcer: Secondary | ICD-10-CM

## 2016-12-09 HISTORY — DX: Essential (primary) hypertension: I10

## 2016-12-09 HISTORY — DX: Type 2 diabetes mellitus without complications: E11.9

## 2016-12-09 MED ORDER — HYDROCODONE-ACETAMINOPHEN 5-325 MG PO TABS: 1.0000 | ORAL_TABLET | ORAL | 0 refills | Status: DC | PRN

## 2016-12-09 MED ORDER — CLINDAMYCIN HCL 150 MG PO CAPS
300.0000 mg | ORAL_CAPSULE | Freq: Once | ORAL | Status: AC
  Administered 2016-12-09: 300 mg via ORAL
  Filled 2016-12-09: qty 2

## 2016-12-09 MED ORDER — HYDROCODONE-ACETAMINOPHEN 5-325 MG PO TABS
1.0000 | ORAL_TABLET | Freq: Once | ORAL | Status: AC
  Administered 2016-12-09: 1 via ORAL
  Filled 2016-12-09: qty 1

## 2016-12-09 MED ORDER — CLINDAMYCIN HCL 300 MG PO CAPS
300.0000 mg | ORAL_CAPSULE | Freq: Three times a day (TID) | ORAL | 0 refills | Status: AC
Start: 2016-12-09 — End: 2016-12-19

## 2016-12-09 NOTE — ED Provider Notes (Signed)
[facility] Emergency Department Provider Note    None    (approximate)  I have reviewed the triage vital signs and the nursing notes.   HISTORY  Chief Complaint Foot Pain    HPI [name] is a 46 y.o. male with chief complaint of left foot pain secondary to chronic diabetic foot ulcer pain followed by podiatry. Patient was recently put on Augmentin roughly 3 weeks ago for similar pain and it improved with antibiotics. States the past 3 days been having worsening pain and also noticed taking increasing drainage. He denies any fevers. No nausea or vomiting. Has not tried to take anything over-the-counter for pain. That his blood sugars have been fairly well-controlled. Does have good close follow-up with podiatry.  Denies any calf swelling or calf pain. No recent immobilization.   Past Medical History:  Diagnosis Date  . Diabetes mellitus without complication (HCC)   . Hypertension    No family history on file. Past Surgical History:  Procedure Laterality Date  . TOE AMPUTATION     There are no active problems to display for this patient.     Prior to Admission medications   Medication Sig Start Date End Date Taking? Authorizing Provider  clindamycin (CLEOCIN) 300 MG capsule Take 1 capsule (300 mg total) by mouth 3 (three) times daily. 12/09/16 12/19/16  Willy Eddy, MD  HYDROcodone-acetaminophen (NORCO) 5-325 MG tablet Take 1 tablet by mouth every 4 (four) hours as needed for moderate pain. 12/09/16   Willy Eddy, MD    Allergies Patient has no known allergies.    Social History Social History  Substance Use Topics  . Smoking status: Never Smoker  . Smokeless tobacco: Never Used  . Alcohol use No    Review of Systems Patient denies headaches, rhinorrhea, blurry vision, numbness, shortness of breath, chest pain, edema, cough, abdominal pain, nausea, vomiting, diarrhea, dysuria, fevers, rashes or hallucinations unless  otherwise stated above in HPI. ____________________________________________   PHYSICAL EXAM:  VITAL SIGNS: Vitals:   12/09/16 0102 12/09/16 0226  BP: 120/72 131/79  Pulse: 87 82  Resp: 18 18  Temp:      Constitutional: Alert and oriented. Well appearing and in no acute distress. Eyes: Conjunctivae are normal. PERRL. EOMI. Head: Atraumatic. Nose: No congestion/rhinnorhea. Mouth/Throat: Mucous membranes are moist.  Oropharynx non-erythematous. Neck: No stridor. Painless ROM. No cervical spine tenderness to palpation Hematological/Lymphatic/Immunilogical: No cervical lymphadenopathy. Cardiovascular: Normal rate, regular rhythm. Grossly normal heart sounds.  Good peripheral circulation. Respiratory: Normal respiratory effort.  No retractions. Lungs CTAB. Gastrointestinal: Soft and nontender. No distention. No abdominal bruits. No CVA tenderness. Genitourinary:  Musculoskeletal:  Bilateral lower extremity venous stasis changes with small area diabetic ulcer on the plantar aspect under the metatarsal phalangeal joint of the fifth toe. No surrounding erythema or cellulitis. No crepitus. No blistering. There is purulent drainage that can be expressed from the ulcer. Neurologic:  Normal speech and language. No gross focal neurologic deficits are appreciated. No gait instability. Skin:  Skin is warm, dry and intact.  Psychiatric: Mood and affect are normal. Speech and behavior are normal.  ____________________________________________   LABS (all labs ordered are listed, but only abnormal results are displayed)  Results for orders placed or performed during the hospital encounter of 12/09/16 (from the past 24 hour(s))  CBC with Differential     Status: Abnormal   Collection Time: 12/08/16 11:29 PM  Result Value Ref Range   WBC 9.6 3.8 - 10.6 K/uL  RBC 4.49 4.40 - 5.90 MIL/uL   Hemoglobin 13.8 13.0 - 18.0 g/dL   HCT 29.5 (L) 62.1 - 30.8 %   MCV 87.5 80.0 - 100.0 fL   MCH 30.7 26.0 -  34.0 pg   MCHC 35.1 32.0 - 36.0 g/dL   RDW 65.7 84.6 - 96.2 %   Platelets 221 150 - 440 K/uL   Neutrophils Relative % 78 %   Neutro Abs 7.5 (H) 1.4 - 6.5 K/uL   Lymphocytes Relative 15 %   Lymphs Abs 1.5 1.0 - 3.6 K/uL   Monocytes Relative 5 %   Monocytes Absolute 0.5 0.2 - 1.0 K/uL   Eosinophils Relative 1 %   Eosinophils Absolute 0.1 0 - 0.7 K/uL   Basophils Relative 1 %   Basophils Absolute 0.0 0 - 0.1 K/uL  Basic metabolic panel     Status: Abnormal   Collection Time: 12/08/16 11:29 PM  Result Value Ref Range   Sodium 135 135 - 145 mmol/L   Potassium 3.9 3.5 - 5.1 mmol/L   Chloride 105 101 - 111 mmol/L   CO2 22 22 - 32 mmol/L   Glucose, Bld 164 (H) 65 - 99 mg/dL   BUN 24 (H) 6 - 20 mg/dL   Creatinine, Ser 9.52 0.61 - 1.24 mg/dL   Calcium 9.1 8.9 - 84.1 mg/dL   GFR calc non Af Amer >60 >60 mL/min   GFR calc Af Amer >60 >60 mL/min   Anion gap 8 5 - 15   ____________________________________________  EKG____________________________________________  RADIOLOGY  I personally reviewed all radiographic images ordered to evaluate for the above acute complaints and reviewed radiology reports and findings.  These findings were personally discussed with the patient.  Please see medical record for radiology report.  ____________________________________________   PROCEDURES  Procedure(s) performed:  Procedures    Critical Care performed: no ____________________________________________   INITIAL IMPRESSION / ASSESSMENT AND PLAN / ED COURSE  Pertinent labs & imaging results that were available during my care of the patient were reviewed by me and considered in my medical decision making (see chart for details).  DDX: DFI, cellulitis, abscess, osteo  [name] is a 46 y.o. who presents to the ED with pain related to diabetic foot infection. Symptoms do get better with Augmentin 3 weeks ago but not currently on any antibiotics. He is currently afebrile and hemodynamic  stable. X-ray ordered to evaluate for any evidence of foreign body or evidence of osteomyelitis. His blood work is otherwise reassuring.  We'll give dose of clindamycin to cover for any acute community associated MRSA given the purulent drainage.  Clinical Course as of Dec 09 737  Wynelle Link Dec 09, 2016  0209 XR without evidence of osteo.      [PR]    Clinical Course User Index [PR] Willy Eddy, MD  Will provide patient with protrusion for antibiotics as well as pain medication and discussed follow-up with podiatry.  Have discussed with the patient and available family all diagnostics and treatments performed thus far and all questions were answered to the best of my ability. The patient demonstrates understanding and agreement with plan.    ____________________________________________   FINAL CLINICAL IMPRESSION(S) / ED DIAGNOSES  Final diagnoses:  Diabetic ulcer of left midfoot associated with type 2 diabetes mellitus, unspecified ulcer stage (HCC)  Foot pain, left      NEW MEDICATIONS STARTED DURING THIS VISIT:  Discharge Medication List as of 12/09/2016  2:08 AM    START taking  these medications   Details  clindamycin (CLEOCIN) 300 MG capsule Take 1 capsule (300 mg total) by mouth 3 (three) times daily., Starting Sun 12/09/2016, Until Wed 12/19/2016, Print    HYDROcodone-acetaminophen (NORCO) 5-325 MG tablet Take 1 tablet by mouth every 4 (four) hours as needed for moderate pain., Starting Sun 12/09/2016, Print         Note:  This document was prepared using Dragon voice recognition software and may include unintentional dictation errors.    Willy Eddy, MD 12/09/16 [phone]

## 2016-12-09 NOTE — Discharge Instructions (Signed)
Please follow up with Dr. Alberteen Spindle.  Return for worsening pain, fevers.

## 2017-03-13 ENCOUNTER — Ambulatory Visit (INDEPENDENT_AMBULATORY_CARE_PROVIDER_SITE_OTHER): Payer: BLUE CROSS/BLUE SHIELD | Admitting: Vascular Surgery

## 2017-03-13 ENCOUNTER — Encounter (INDEPENDENT_AMBULATORY_CARE_PROVIDER_SITE_OTHER): Payer: Self-pay | Admitting: Vascular Surgery

## 2017-03-13 VITALS — BP 122/73 | HR 89 | Resp 16 | Ht 70.0 in | Wt 296.0 lb

## 2017-03-13 DIAGNOSIS — I739 Peripheral vascular disease, unspecified: Secondary | ICD-10-CM | POA: Diagnosis not present

## 2017-03-13 DIAGNOSIS — E785 Hyperlipidemia, unspecified: Secondary | ICD-10-CM

## 2017-03-13 DIAGNOSIS — E1169 Type 2 diabetes mellitus with other specified complication: Secondary | ICD-10-CM | POA: Diagnosis not present

## 2017-03-13 DIAGNOSIS — I10 Essential (primary) hypertension: Secondary | ICD-10-CM

## 2017-03-13 DIAGNOSIS — I872 Venous insufficiency (chronic) (peripheral): Secondary | ICD-10-CM | POA: Diagnosis not present

## 2017-03-13 DIAGNOSIS — E669 Obesity, unspecified: Secondary | ICD-10-CM

## 2017-03-13 NOTE — Progress Notes (Signed)
Subjective:    Patient ID: [name], male    DOB: 09-10-1971, 46 y.o.   MRN: [id number] Chief Complaint  Patient presents with  . New Evaluation    Left leg Varicose veins with ulcerations   Presents as a new patient referred by his podiatrist for a left lower extremity ulceration. Patient endorses a history of undergoing care with podiatry for an ulceration to the bottom of his left foot for a few months. Patient has undergone what sounds like "injections" into the veins located on his right lower extremity many years ago. The patient also experiences bilateral lower extremity edema which worsens throughout the day. The patient stands for many hours during work. At this time, he does not engage in conservative therapy or elevation of his lower extremities. Patient has experienced small ulcerations to his bilateral lower extremities which have drained a clear fluid. The patient does experience bilateral lower extremity calf pain with activity. Patient denies any fever, nausea vomiting   Review of Systems  Constitutional: Negative.   HENT: Negative.   Eyes: Negative.   Respiratory: Negative.   Cardiovascular: Positive for leg swelling.  Gastrointestinal: Negative.   Endocrine: Negative.   Genitourinary: Negative.   Musculoskeletal: Negative.   Skin: Positive for wound.  Allergic/Immunologic: Negative.   Neurological: Negative.   Hematological: Negative.   Psychiatric/Behavioral: Negative.       Objective:   Physical Exam  Constitutional: He is oriented to person, place, and time. He appears well-developed and well-nourished. No distress.  Obese  HENT:  Head: Normocephalic and atraumatic.  Eyes: Conjunctivae are normal. Pupils are equal, round, and reactive to light.  Neck: Normal range of motion.  Cardiovascular: Normal rate, regular rhythm, normal heart sounds and intact distal pulses.   Pulses:      Radial pulses are 2+ on the right side, and 2+ on the left side.    Unable to palpate pedal pulses feet are warm up to mid foot and the foot becomes colder  Pulmonary/Chest: Effort normal.  Musculoskeletal: Normal range of motion. He exhibits edema (Bilateral moderate 1+ pitting edema).  Neurological: He is alert and oriented to person, place, and time.  Skin: He is not diaphoretic.  Patient with bilateral severe stasis dermatitis Patient with scattered scabs. Fibrosis of tissue noted bilaterally Patient with greater than 1cm varicose veins on both extremities. Patient with a 1 cm x 1 cm superficial ulceration to the bottom of his left foot, there is no infection noted, there is no drainage noted, is no cellulitis noted  Psychiatric: He has a normal mood and affect. His behavior is normal. Judgment and thought content normal.    BP 122/73 (BP Location: Right Arm)   Pulse 89   Resp 16   Ht 5\' 10"  (1.778 m)   Wt 296 lb (134.3 kg)   BMI 42.47 kg/m   Past Medical History:  Diagnosis Date  . Diabetes mellitus without complication (HCC)   . Hypertension     Social History   Social History  . Marital status: Married    Spouse name: N/A  . Number of children: N/A  . Years of education: N/A   Occupational History  . Not on file.   Social History Main Topics  . Smoking status: Never Smoker  . Smokeless tobacco: Never Used  . Alcohol use No  . Drug use: No  . Sexual activity: Not on file   Other Topics Concern  . Not on file   Social  History Narrative  . No narrative on file    Past Surgical History:  Procedure Laterality Date  . TOE AMPUTATION      No family history on file.  No Known Allergies     Assessment & Plan:  Presents as a new patient referred by his podiatrist for a left lower extremity ulceration. Patient endorses a history of undergoing care with podiatry for an ulceration to the bottom of his left foot for a few months. Patient has undergone what sounds like "injections" into the veins located on his right lower  extremity many years ago. The patient also experiences bilateral lower extremity edema which worsens throughout the day. The patient stands for many hours during work. At this time, he does not engage in conservative therapy or elevation of his lower extremities. Patient has experienced small ulcerations to his bilateral lower extremities which have drained a clear fluid. The patient does experience bilateral lower extremity calf pain with activity. Patient denies any fever, nausea vomiting  1. Peripheral artery disease (HCC) - new Patient with multiple risk factors for peripheral artery disease Unable to palpate pedal pulses on exam Patient with nonhealing ulceration to the left lower foot Recommend a stat ABI to rule out any contributing PAD  - VAS [place] ABI WITH/WO TBI; Future  2. Chronic venous insufficiency - new Patient with moderate pitting edema of the lower extremity Once the patient has an ABI and arterial status is known will consider Unna boot therapy In the meantime to gain some control the patient was encouraged to wear graduated compression stockings (20-30 mmHg) on a daily basis. The patient was instructed to begin wearing the stockings first thing in the morning and removing them in the evening. The patient was instructed specifically not to sleep in the stockings. Prescription given In addition, behavioral modification including elevation during the day will be initiated. Anti-inflammatories for pain.  - VAS [place] LOWER EXTREMITY VENOUS REFLUX; Future  3. Diabetes mellitus type 2 in obese (HCC) - stable Encouraged good control as its slows the progression of atherosclerotic disease  4. Hyperlipidemia, unspecified hyperlipidemia type - stable Encouraged good control as its slows the progression of atherosclerotic disease  5. Essential hypertension - stable Encouraged good control as its slows the progression of atherosclerotic disease   Current Outpatient Prescriptions on File  Prior to Visit  Medication Sig Dispense Refill  . HYDROcodone-acetaminophen (NORCO) 5-325 MG tablet Take 1 tablet by mouth every 4 (four) hours as needed for moderate pain. 6 tablet 0   No current facility-administered medications on file prior to visit.     There are no Patient Instructions on file for this visit. No Follow-up on file.   [name] Meissner A Danette Weinfeld, PA-C

## 2017-04-17 ENCOUNTER — Ambulatory Visit (INDEPENDENT_AMBULATORY_CARE_PROVIDER_SITE_OTHER): Payer: BLUE CROSS/BLUE SHIELD

## 2017-04-17 DIAGNOSIS — I739 Peripheral vascular disease, unspecified: Secondary | ICD-10-CM | POA: Diagnosis not present

## 2017-04-24 ENCOUNTER — Ambulatory Visit (INDEPENDENT_AMBULATORY_CARE_PROVIDER_SITE_OTHER): Payer: BLUE CROSS/BLUE SHIELD

## 2017-04-24 DIAGNOSIS — I872 Venous insufficiency (chronic) (peripheral): Secondary | ICD-10-CM

## 2017-06-03 ENCOUNTER — Encounter (INDEPENDENT_AMBULATORY_CARE_PROVIDER_SITE_OTHER): Payer: Self-pay | Admitting: Vascular Surgery

## 2017-06-03 ENCOUNTER — Ambulatory Visit (INDEPENDENT_AMBULATORY_CARE_PROVIDER_SITE_OTHER): Payer: BLUE CROSS/BLUE SHIELD | Admitting: Vascular Surgery

## 2017-06-03 VITALS — BP 126/72 | HR 100 | Resp 17 | Wt 303.0 lb

## 2017-06-03 DIAGNOSIS — E785 Hyperlipidemia, unspecified: Secondary | ICD-10-CM

## 2017-06-03 DIAGNOSIS — I872 Venous insufficiency (chronic) (peripheral): Secondary | ICD-10-CM

## 2017-06-03 DIAGNOSIS — E669 Obesity, unspecified: Secondary | ICD-10-CM | POA: Diagnosis not present

## 2017-06-03 DIAGNOSIS — E1169 Type 2 diabetes mellitus with other specified complication: Secondary | ICD-10-CM

## 2017-06-03 NOTE — Progress Notes (Signed)
Subjective:    Patient ID: [name], male    DOB: 04/21/1971, 46 y.o.   MRN: [id number] Chief Complaint  Patient presents with  . Follow-up    ultrasound results   Patient presents to review vascular studies. On 04/24/2017 the patient underwent a lower extremity venous duplex which was notable for a venous incompetence in the left great and small saphenous veins as well as throughout the bilateral deep venous system. Minimally occlusive chronic thrombus in the right proximal to mid thigh level varicosity. No evidence of deep or superficial vein thrombosis in the remainder of bilateral lower extremity veins. On 04/17/2017 the patient underwent a bilateral ABI which was notable for no significant lower extremity arterial disease. Bilateral toe brachial indices are normal. Patient is still under the care of his podiatrist for a non-healing left foot. Most recent debridement was yesterday. The patient continues to wear medical grade 1 compression stockings and elevate his legs on a daily basis.   Review of Systems  Constitutional: Negative.   HENT: Negative.   Eyes: Negative.   Respiratory: Negative.   Cardiovascular: Positive for leg swelling.  Gastrointestinal: Negative.   Endocrine: Negative.   Genitourinary: Negative.   Musculoskeletal: Negative.   Skin: Positive for wound.  Allergic/Immunologic: Negative.   Neurological: Negative.   Hematological: Negative.   Psychiatric/Behavioral: Negative.       Objective:   Physical Exam  Constitutional: He is oriented to person, place, and time. He appears well-developed and well-nourished. No distress.  HENT:  Head: Normocephalic and atraumatic.  Eyes: Pupils are equal, round, and reactive to light. Conjunctivae are normal.  Neck: Normal range of motion.  Cardiovascular: Normal rate, normal heart sounds and intact distal pulses.   Pulses:      Radial pulses are 2+ on the right side, and 2+ on the left side.  Unable to palpate  pedal pulses due to the habitus and edema  Pulmonary/Chest: Effort normal.  Musculoskeletal: Normal range of motion. He exhibits edema (Moderate edema).  Neurological: He is alert and oriented to person, place, and time.  Skin: Skin is warm and dry. He is not diaphoretic.     Moderate stasis dermatitis. 2cm x 2cm the wound noted on bottom of left foot.   Psychiatric: He has a normal mood and affect. His behavior is normal. Judgment and thought content normal.  Vitals reviewed.   BP 126/72   Pulse 100   Resp 17   Wt (!) 303 lb (137.4 kg)   BMI 43.48 kg/m   Past Medical History:  Diagnosis Date  . Diabetes mellitus without complication (HCC)   . Hypertension     Social History   Social History  . Marital status: Married    Spouse name: N/A  . Number of children: N/A  . Years of education: N/A   Occupational History  . Not on file.   Social History Main Topics  . Smoking status: Never Smoker  . Smokeless tobacco: Never Used  . Alcohol use No  . Drug use: No  . Sexual activity: Not on file   Other Topics Concern  . Not on file   Social History Narrative  . No narrative on file    Past Surgical History:  Procedure Laterality Date  . TOE AMPUTATION      No family history on file.  No Known Allergies     Assessment & Plan:  Patient presents to review vascular studies. On 04/24/2017 the patient underwent a lower  extremity venous duplex which was notable for a venous incompetence in the left great and small saphenous veins as well as throughout the bilateral deep venous system. Minimally occlusive chronic thrombus in the right proximal to mid thigh level varicosity. No evidence of deep or superficial vein thrombosis in the remainder of bilateral lower extremity veins. On 04/17/2017 the patient underwent a bilateral ABI which was notable for no significant lower extremity arterial disease. Bilateral toe brachial indices are normal. Patient is still under the care of  his podiatrist for a non-healing left foot. Most recent debridement was yesterday. The patient continues to wear medical grade 1 compression stockings and elevate his legs on a daily basis.  1. Chronic venous insufficiency - new Patient with greater saphenous and less saphenous vein reflux He shouldn't has engaged in over 3 months of conservative therapy wearing medical grade 1 compression elevating his legs heart level or higher with minimal improvement in symptoms Recommend laser ablation to the left greater saphenous vein and small saphenous vein in an effort to she'll a chronic wound located on his left foot Procedure, risks and benefits explained to the patient. All questions answered. Patient wishes to proceed  2. Diabetes mellitus type 2 in obese (HCC) - stable Encouraged good control as its slows the progression of atherosclerotic disease  3. Hyperlipidemia, unspecified hyperlipidemia type - stable Encouraged good control as its slows the progression of atherosclerotic disease   Current Outpatient Prescriptions on File Prior to Visit  Medication Sig Dispense Refill  . HYDROcodone-acetaminophen (NORCO) 5-325 MG tablet Take 1 tablet by mouth every 4 (four) hours as needed for moderate pain. 6 tablet 0  . insulin NPH-regular Human (NOVOLIN 70/30) (70-30) 100 UNIT/ML injection Inject 35 units in the breakfast and 60 units at supper    . lisinopril-hydrochlorothiazide (PRINZIDE,ZESTORETIC) 10-12.5 MG tablet TK 1 T PO QD  1  . PARoxetine (PAXIL) 20 MG tablet Take by mouth.    . simvastatin (ZOCOR) 10 MG tablet Take by mouth.     No current facility-administered medications on file prior to visit.     There are no Patient Instructions on file for this visit. No Follow-up on file.   KIMBERLY A STEGMAYER, PA-C

## 2017-07-04 ENCOUNTER — Encounter (INDEPENDENT_AMBULATORY_CARE_PROVIDER_SITE_OTHER): Payer: Self-pay | Admitting: Vascular Surgery

## 2017-07-04 ENCOUNTER — Ambulatory Visit (INDEPENDENT_AMBULATORY_CARE_PROVIDER_SITE_OTHER): Payer: BLUE CROSS/BLUE SHIELD | Admitting: Vascular Surgery

## 2017-07-04 VITALS — BP 136/83 | HR 81 | Resp 15 | Ht 71.0 in | Wt 303.0 lb

## 2017-07-04 DIAGNOSIS — I83819 Varicose veins of unspecified lower extremities with pain: Secondary | ICD-10-CM

## 2017-07-04 DIAGNOSIS — I872 Venous insufficiency (chronic) (peripheral): Secondary | ICD-10-CM | POA: Diagnosis not present

## 2017-07-06 DIAGNOSIS — I83819 Varicose veins of unspecified lower extremities with pain: Secondary | ICD-10-CM | POA: Insufficient documentation

## 2017-07-06 NOTE — Progress Notes (Signed)
    MRN : [id number]  REVAN DERRICKS is a 46 y.o. (October 16, 1970) male who presents with chief complaint of  Chief Complaint  Patient presents with  . Venous Insufficiency    Laser ablation (GSV/Left)  .    The patient's left lower extremity was sterilely prepped and draped.  The ultrasound machine was used to visualize the great saphenous vein throughout its course.  A segment of the great saphenous vein was selected for access.  The saphenous vein was accessed without difficulty using ultrasound guidance with a micropuncture needle.   An 0.018  wire was placed beyond the saphenofemoral junction through the sheath and the microneedle was removed.  The 65 cm sheath was then placed over the wire and the wire and dilator were removed.  The laser fiber was placed through the sheath and its tip was placed approximately 2 cm below the saphenofemoral junction.  Tumescent anesthesia was then created with a dilute lidocaine solution.  Laser energy was then delivered with constant withdrawal of the sheath and laser fiber.  Approximately 1482 Joules of energy were delivered over a length of 41 cm.    The patient's leg was then repositioned and reprepped and redraped in a sterile fashion. The anterior saphenous vein was then evaluated with ultrasound.   A segment in the mid thigh was selected for access.  The saphenous vein was accessed without difficulty using ultrasound guidance with a micropuncture needle.   An 0.018  wire was placed beyond the saphenofemoral junction through the sheath and the microneedle was removed.  The 65 cm sheath was then placed over the wire and the wire and dilator were removed.  The laser fiber was placed through the sheath and its tip was placed approximately 2 cm below the saphenofemoral junction.  Tumescent anesthesia was then created with a dilute lidocaine solution.  Laser energy was then delivered with constant withdrawal of the sheath and laser fiber.  Approximately 295 Joules of  energy were delivered over a length of 9 cm.    Sterile dressings were placed.  The patient tolerated the procedure well without complications.

## 2017-07-08 ENCOUNTER — Ambulatory Visit (INDEPENDENT_AMBULATORY_CARE_PROVIDER_SITE_OTHER): Payer: BLUE CROSS/BLUE SHIELD

## 2017-07-08 ENCOUNTER — Other Ambulatory Visit (INDEPENDENT_AMBULATORY_CARE_PROVIDER_SITE_OTHER): Payer: Self-pay | Admitting: Vascular Surgery

## 2017-07-08 DIAGNOSIS — I872 Venous insufficiency (chronic) (peripheral): Secondary | ICD-10-CM | POA: Diagnosis not present

## 2017-07-11 ENCOUNTER — Telehealth (INDEPENDENT_AMBULATORY_CARE_PROVIDER_SITE_OTHER): Payer: Self-pay

## 2017-07-11 NOTE — Telephone Encounter (Signed)
Patient called stating he was having pain at the top of his leg from his laser ablation on 07/04/17. I asked if he was having any other symptoms and he stated just the pain, I advised that he take Ibuprofen for the pain, elevate the leg and wear his compression hose. I also let him know that it will take time because his body is reabsorbing the vein that has been ablated. He stated it was hard and I explained that is normal and should get better in a few weeks to a mont. Spoke with Raul Del PA.

## 2017-07-13 ENCOUNTER — Encounter: Payer: Self-pay | Admitting: Emergency Medicine

## 2017-07-13 ENCOUNTER — Emergency Department
Admission: EM | Admit: 2017-07-13 | Discharge: 2017-07-13 | Disposition: A | Discharge status: Home / Self Care | Payer: BLUE CROSS/BLUE SHIELD | Attending: Emergency Medicine | Admitting: Emergency Medicine

## 2017-07-13 DIAGNOSIS — E119 Type 2 diabetes mellitus without complications: Secondary | ICD-10-CM | POA: Diagnosis not present

## 2017-07-13 DIAGNOSIS — Z79899 Other long term (current) drug therapy: Secondary | ICD-10-CM | POA: Diagnosis not present

## 2017-07-13 DIAGNOSIS — R42 Dizziness and giddiness: Secondary | ICD-10-CM

## 2017-07-13 DIAGNOSIS — R112 Nausea with vomiting, unspecified: Secondary | ICD-10-CM | POA: Insufficient documentation

## 2017-07-13 DIAGNOSIS — Z794 Long term (current) use of insulin: Secondary | ICD-10-CM | POA: Insufficient documentation

## 2017-07-13 DIAGNOSIS — I10 Essential (primary) hypertension: Secondary | ICD-10-CM | POA: Insufficient documentation

## 2017-07-13 DIAGNOSIS — E860 Dehydration: Secondary | ICD-10-CM | POA: Diagnosis not present

## 2017-07-13 LAB — GLUCOSE, CAPILLARY: Glucose-Capillary: 268 mg/dL — ABNORMAL HIGH (ref 65–99)

## 2017-07-13 LAB — CBC
HEMATOCRIT: 40 % (ref 40.0–52.0)
HEMOGLOBIN: 13.9 g/dL (ref 13.0–18.0)
MCH: 30.9 pg (ref 26.0–34.0)
MCHC: 34.8 g/dL (ref 32.0–36.0)
MCV: 88.7 fL (ref 80.0–100.0)
Platelets: 228 10*3/uL (ref 150–440)
RBC: 4.51 MIL/uL (ref 4.40–5.90)
RDW: 13.4 % (ref 11.5–14.5)
WBC: 8.1 10*3/uL (ref 3.8–10.6)

## 2017-07-13 LAB — COMPREHENSIVE METABOLIC PANEL
ALT: 12 U/L — ABNORMAL LOW (ref 17–63)
ANION GAP: 11 (ref 5–15)
AST: 16 U/L (ref 15–41)
Albumin: 4.1 g/dL (ref 3.5–5.0)
Alkaline Phosphatase: 78 U/L (ref 38–126)
BILIRUBIN TOTAL: 1.5 mg/dL — AB (ref 0.3–1.2)
BUN: 25 mg/dL — ABNORMAL HIGH (ref 6–20)
CO2: 27 mmol/L (ref 22–32)
Calcium: 9.5 mg/dL (ref 8.9–10.3)
Chloride: 95 mmol/L — ABNORMAL LOW (ref 101–111)
Creatinine, Ser: 1.43 mg/dL — ABNORMAL HIGH (ref 0.61–1.24)
GFR calc non Af Amer: 57 mL/min — ABNORMAL LOW (ref >60)
GLUCOSE: 309 mg/dL — AB (ref 65–99)
POTASSIUM: 4.4 mmol/L (ref 3.5–5.1)
SODIUM: 133 mmol/L — AB (ref 135–145)
TOTAL PROTEIN: 8.3 g/dL — AB (ref 6.5–8.1)

## 2017-07-13 LAB — URINALYSIS, COMPLETE (UACMP) WITH MICROSCOPIC
BACTERIA UA: NONE SEEN
BILIRUBIN URINE: NEGATIVE
Glucose, UA: >=500 mg/dL — AB
Hgb urine dipstick: NEGATIVE
Ketones, ur: 5 mg/dL — AB
LEUKOCYTES UA: NEGATIVE
Nitrite: NEGATIVE
PROTEIN: 30 mg/dL — AB
Specific Gravity, Urine: 1.025 (ref 1.005–1.030)
pH: 5 (ref 5.0–8.0)

## 2017-07-13 LAB — LIPASE, BLOOD: LIPASE: 23 U/L (ref 11–51)

## 2017-07-13 MED ORDER — KETOROLAC TROMETHAMINE 30 MG/ML IJ SOLN
30.0000 mg | Freq: Once | INTRAMUSCULAR | Status: AC
  Administered 2017-07-13: 30 mg via INTRAVENOUS
  Filled 2017-07-13: qty 1

## 2017-07-13 MED ORDER — SODIUM CHLORIDE 0.9 % IV BOLUS (SEPSIS)
1000.0000 mL | Freq: Once | INTRAVENOUS | Status: AC
  Administered 2017-07-13: 1000 mL via INTRAVENOUS

## 2017-07-13 MED ORDER — ONDANSETRON 4 MG PO TBDP: 4.0000 mg | ORAL_TABLET | Freq: Once | ORAL | Status: DC | PRN

## 2017-07-13 MED ORDER — DIPHENHYDRAMINE HCL 50 MG/ML IJ SOLN
25.0000 mg | Freq: Once | INTRAMUSCULAR | Status: AC
  Administered 2017-07-13: 25 mg via INTRAVENOUS
  Filled 2017-07-13: qty 1

## 2017-07-13 MED ORDER — ONDANSETRON HCL 4 MG PO TABS: 4.0000 mg | ORAL_TABLET | Freq: Three times a day (TID) | ORAL | 0 refills | Status: DC | PRN

## 2017-07-13 MED ORDER — ONDANSETRON HCL 4 MG/2ML IJ SOLN
4.0000 mg | Freq: Once | INTRAMUSCULAR | Status: AC
  Administered 2017-07-13: 4 mg via INTRAVENOUS
  Filled 2017-07-13: qty 2

## 2017-07-13 NOTE — ED Notes (Signed)
Patient reports dizziness since Tuesday. History of vertigo. Patient made aware of need of urine sample. Urinal within reach.

## 2017-07-13 NOTE — Discharge Instructions (Signed)
You are evaluated for nausea and vomiting and although no certain cause was found, your exam and evaluation are overall reassuring.  You were treated for mild dehydration here in the emergency department.  Return to the emergency department immediately for any worsening condition including fever, vomiting blood, black or bloody stools, abdominal pain, weakness, numbness, slurred speech, confusion altered mental status, passing out, or any other symptoms concerning to you.

## 2017-07-13 NOTE — ED Notes (Signed)
Spoke with lab in regards to UA results. They state the UA will be resulted in 5-10 minutes.

## 2017-07-13 NOTE — ED Triage Notes (Signed)
Pt reports he has been having nausea since Tuesday and started vomiting on Thursday, reports feeling dizzy since last Thursday after having vein procedure to left leg. Pt reports he takes insulin but does not ck his CBG "not sure if my blood sugar is high"

## 2017-07-13 NOTE — ED Provider Notes (Signed)
[facility] Emergency Department Provider Note ____________________________________________   I have reviewed the triage vital signs and the triage nursing note.  HISTORY  Chief Complaint Nausea and Emesis   Historian Patient and wife at the bedside  HPI [name] is a 46 y.o. male with a history of diabetes and hypertension as well as varicose veins, had local injection of varicose veins in the office this past week, presents with nausea and vomiting and dizziness over the past week as well.  States that he felt nauseated on Tuesday Wednesday.  On Thursday he started having vomiting.  Nonbloody nonbilious emesis.  Numerous episodes he feels sore in the abdomen without pain.  No diarrhea or constipation.  He is also feeling lightheaded.  He states he feels like he might pass out when he stands up, but reports no room spinning.  He reports history of vertigo in the past, and often has less so room spinning more so lightheadedness.  He also has a history of prior migraines.  He has had a mild nonspecific headache for a couple of days as well.  No focal neurologic symptoms such as slurred speech or focal weakness or numbness.   Past Medical History:  Diagnosis Date  . Diabetes mellitus without complication (HCC)   . Hypertension     Patient Active Problem List   Diagnosis Date Noted  . Varicose veins with pain 07/06/2017  . Diabetes mellitus type 2 in obese (HCC) 03/13/2017  . Peripheral artery disease (HCC) 03/13/2017  . Chronic venous insufficiency 03/13/2017  . Hyperlipidemia 03/13/2017  . Essential hypertension 03/13/2017    Past Surgical History:  Procedure Laterality Date  . TOE AMPUTATION      Prior to Admission medications   Medication Sig Start Date End Date Taking? Authorizing Provider  Continuous Blood Gluc Sensor (FREESTYLE LIBRE SENSOR SYSTEM) MISC U UTD Q 10 DAYS. 06/07/17   [provider]  HYDROcodone-acetaminophen (NORCO)  5-325 MG tablet Take 1 tablet by mouth every 4 (four) hours as needed for moderate pain. 12/09/16   Willy Eddy, MD  insulin NPH-regular Human (NOVOLIN 70/30) (70-30) 100 UNIT/ML injection Inject 35 units in the breakfast and 60 units at supper 03/06/17   [provider]  lisinopril-hydrochlorothiazide (PRINZIDE,ZESTORETIC) 10-12.5 MG tablet TK 1 T PO QD 03/06/17   [provider]  ondansetron (ZOFRAN) 4 MG tablet Take 1 tablet (4 mg total) by mouth every 8 (eight) hours as needed for nausea or vomiting. 07/13/17   Governor Rooks, MD  PARoxetine (PAXIL) 20 MG tablet Take by mouth. 03/06/17   [provider]  simvastatin (ZOCOR) 10 MG tablet Take by mouth. 07/26/16   [provider]    No Known Allergies  No family history on file.  Social History Social History  Substance Use Topics  . Smoking status: Never Smoker  . Smokeless tobacco: Never Used  . Alcohol use No    Review of Systems  Constitutional: Negative for fever. Eyes: Negative for visual changes. ENT: Negative for sore throat. Cardiovascular: Negative for chest pain. Respiratory: Negative for shortness of breath. Gastrointestinal: Negative for abdominal pain, vomiting and diarrhea. Genitourinary: Negative for dysuria. Musculoskeletal: Negative for back pain. Skin: Negative for rash. Neurological: Negative for headache.  ____________________________________________   PHYSICAL EXAM:  VITAL SIGNS: ED Triage Vitals  Enc Vitals Group     BP 07/13/17 0709 131/79     Pulse Rate 07/13/17 0709 92     Resp 07/13/17 0709 20  Temp 07/13/17 0709 98.6 F (37 C)     Temp Source 07/13/17 0709 Oral     SpO2 07/13/17 0709 100 %     Weight 07/13/17 0710 298 lb (135.2 kg)     Height 07/13/17 0710 5\' 11"  (1.803 m)     Head Circumference --      Peak Flow --      Pain Score 07/13/17 0707 9     Pain Loc --      Pain Edu? --      Excl. in GC? --      Constitutional: Alert and  oriented. Well appearing and in no distress. HEENT   Head: Normocephalic and atraumatic.      Eyes: Conjunctivae are normal. Pupils equal and round.       Ears:         Nose: No congestion/rhinnorhea.   Mouth/Throat: Mucous membranes are mildly dry.   Neck: No stridor. Cardiovascular/Chest: Normal rate, regular rhythm.  No murmurs, rubs, or gallops. Respiratory: Normal respiratory effort without tachypnea nor retractions. Breath sounds are clear and equal bilaterally. No wheezes/rales/rhonchi. Gastrointestinal: Soft. No distention, no guarding, no rebound. Nontender.    Genitourinary/rectal:Deferred Musculoskeletal: Left thigh with areas of bruising and induration along the course of the vein.  Overall nontender with normal range of motion in all extremities. No joint effusions.  No lower extremity tenderness.  No edema. Neurologic: Facial droop.  Cranial nerves II through X intact.  Normal speech and language. No gross or focal neurologic deficits are appreciated. Skin:  Skin is warm, dry and intact. No rash noted. Psychiatric: Mood and affect are normal. Speech and behavior are normal. Patient exhibits appropriate insight and judgment.   ____________________________________________  LABS (pertinent positives/negatives) I, Governor Rooks, MD the attending physician have reviewed the labs noted below.  Labs Reviewed  COMPREHENSIVE METABOLIC PANEL - Abnormal; Notable for the following:       Result Value   Sodium 133 (*)    Chloride 95 (*)    Glucose, Bld 309 (*)    BUN 25 (*)    Creatinine, Ser 1.43 (*)    Total Protein 8.3 (*)    ALT 12 (*)    Total Bilirubin 1.5 (*)    GFR calc non Af Amer 57 (*)    All other components within normal limits  URINALYSIS, COMPLETE (UACMP) WITH MICROSCOPIC - Abnormal; Notable for the following:    Color, Urine YELLOW (*)    APPearance HAZY (*)    Glucose, UA >=500 (*)    Ketones, ur 5 (*)    Protein, ur 30 (*)    Squamous Epithelial /  LPF 0-5 (*)    All other components within normal limits  GLUCOSE, CAPILLARY - Abnormal; Notable for the following:    Glucose-Capillary 268 (*)    All other components within normal limits  LIPASE, BLOOD  CBC  TROPONIN I  CBG MONITORING, ED    ____________________________________________    EKG I, Governor Rooks, MD, the attending physician have personally viewed and interpreted all ECGs.  At 93 bpm.  Normal sinus rhythm.  Narrow QS with normal axis.  Nonspecific T wave ____________________________________________  RADIOLOGY All Xrays were viewed by me.  Imaging interpreted by Radiologist, and I, Governor Rooks, MD the attending physician have reviewed the radiologist interpretation noted below.  None __________________________________________  PROCEDURES  Procedure(s) performed: None  Critical Care performed: None  ____________________________________________  No current facility-administered medications on file prior to encounter.  Current Outpatient Prescriptions on File Prior to Encounter  Medication Sig Dispense Refill  . Continuous Blood Gluc Sensor (FREESTYLE LIBRE SENSOR SYSTEM) MISC U UTD Q 10 DAYS.  11  . HYDROcodone-acetaminophen (NORCO) 5-325 MG tablet Take 1 tablet by mouth every 4 (four) hours as needed for moderate pain. 6 tablet 0  . insulin NPH-regular Human (NOVOLIN 70/30) (70-30) 100 UNIT/ML injection Inject 35 units in the breakfast and 60 units at supper    . lisinopril-hydrochlorothiazide (PRINZIDE,ZESTORETIC) 10-12.5 MG tablet TK 1 T PO QD  1  . PARoxetine (PAXIL) 20 MG tablet Take by mouth.    . simvastatin (ZOCOR) 10 MG tablet Take by mouth.      ____________________________________________  ED COURSE / ASSESSMENT AND PLAN  Pertinent labs & imaging results that were available during my care of the patient were reviewed by me and considered in my medical decision making (see chart for details).    Mr. Pecola LeisureReese sounds like has had some  volume depletion as a result of vomiting over the past several days and I am we will check orthostatics.  It sounds like his symptoms are made worse with standing up.  Laboratory studies show reassuring no elevated white blood cell count, hemoglobin 13.9.  His BUN and creatinine are slightly elevated from last creatinine 1.17, now 1.43.  I am going to start him on IV fluid bolus.  In terms of the vomiting, this could be related to vertigo or GI illness, and I am less suspicious for ACS with reassuring EKG but I am going to add on a troponin.  He is also having a bit of a headache, and history of migraines, so I am going to treat him for medications which may help with migraines versus vertigo.  I am not suspicious for intracranial emergency condition at this point in time and it sounds like he had a CT in the past, and I do not feel that he warrants CT head imaging at this point I discussed that with the patient and his wife and they are agreeable.  On orthostatics, no drop in blood pressure significant, however he did raise his heart rate from lying to sitting likely consistent with some element of hypovolemia.  Urinalysis is normal.  Patient feels better and I think is okay for discharge home.  DIFFERENTIAL DIAGNOSIS: Including but not limited to dehydration, renal failure, intracranial emergency, vertigo, viral illness, migraine, ACS, etc.  CONSULTATIONS:   None   Patient / Family / Caregiver informed of clinical course, medical decision-making process, and agree with plan.   I discussed return precautions, follow-up instructions, and discharge instructions with patient and/or family.  Discharge Instructions : You are evaluated for nausea and vomiting and although no certain cause was found, your exam and evaluation are overall reassuring.  You were treated for mild dehydration here in the emergency department.  Return to the emergency department immediately for any worsening condition  including fever, vomiting blood, black or bloody stools, abdominal pain, weakness, numbness, slurred speech, confusion altered mental status, passing out, or any other symptoms concerning to you.  ___________________________________________   FINAL CLINICAL IMPRESSION(S) / ED DIAGNOSES   Final diagnoses:  Non-intractable vomiting with nausea, unspecified vomiting type  Dehydration  Dizziness              Note: This dictation was prepared with Dragon dictation. Any transcriptional errors that result from this process are unintentional    Governor RooksLord, Adi Seales, MD 07/13/17 1440

## 2017-07-13 NOTE — ED Notes (Signed)
Dr. Lord at bedside.  

## 2017-07-15 ENCOUNTER — Emergency Department
Admission: EM | Admit: 2017-07-15 | Discharge: 2017-07-15 | Disposition: A | Discharge status: Home / Self Care | Payer: BLUE CROSS/BLUE SHIELD | Attending: Emergency Medicine | Admitting: Emergency Medicine

## 2017-07-15 ENCOUNTER — Encounter: Payer: Self-pay | Admitting: Emergency Medicine

## 2017-07-15 ENCOUNTER — Emergency Department: Payer: BLUE CROSS/BLUE SHIELD

## 2017-07-15 DIAGNOSIS — Z79899 Other long term (current) drug therapy: Secondary | ICD-10-CM | POA: Insufficient documentation

## 2017-07-15 DIAGNOSIS — I10 Essential (primary) hypertension: Secondary | ICD-10-CM | POA: Diagnosis not present

## 2017-07-15 DIAGNOSIS — R1084 Generalized abdominal pain: Secondary | ICD-10-CM | POA: Diagnosis not present

## 2017-07-15 DIAGNOSIS — R112 Nausea with vomiting, unspecified: Secondary | ICD-10-CM | POA: Insufficient documentation

## 2017-07-15 DIAGNOSIS — E119 Type 2 diabetes mellitus without complications: Secondary | ICD-10-CM | POA: Diagnosis not present

## 2017-07-15 DIAGNOSIS — R1110 Vomiting, unspecified: Secondary | ICD-10-CM

## 2017-07-15 DIAGNOSIS — Z794 Long term (current) use of insulin: Secondary | ICD-10-CM | POA: Diagnosis not present

## 2017-07-15 LAB — URINALYSIS, COMPLETE (UACMP) WITH MICROSCOPIC
Bacteria, UA: NONE SEEN
Bilirubin Urine: NEGATIVE
Glucose, UA: >=500 mg/dL — AB
KETONES UR: 20 mg/dL — AB
Leukocytes, UA: NEGATIVE
Nitrite: NEGATIVE
PH: 5 (ref 5.0–8.0)
PROTEIN: 100 mg/dL — AB
Specific Gravity, Urine: 1.026 (ref 1.005–1.030)

## 2017-07-15 LAB — COMPREHENSIVE METABOLIC PANEL
ALBUMIN: 4 g/dL (ref 3.5–5.0)
ALK PHOS: 72 U/L (ref 38–126)
ALT: 13 U/L — AB (ref 17–63)
AST: 16 U/L (ref 15–41)
Anion gap: 8 (ref 5–15)
BUN: 21 mg/dL — ABNORMAL HIGH (ref 6–20)
CALCIUM: 9.3 mg/dL (ref 8.9–10.3)
CO2: 27 mmol/L (ref 22–32)
CREATININE: 1.35 mg/dL — AB (ref 0.61–1.24)
Chloride: 97 mmol/L — ABNORMAL LOW (ref 101–111)
GFR calc non Af Amer: >60 mL/min (ref >60)
GLUCOSE: 249 mg/dL — AB (ref 65–99)
Potassium: 3.9 mmol/L (ref 3.5–5.1)
SODIUM: 132 mmol/L — AB (ref 135–145)
Total Bilirubin: 1.4 mg/dL — ABNORMAL HIGH (ref 0.3–1.2)
Total Protein: 8.5 g/dL — ABNORMAL HIGH (ref 6.5–8.1)

## 2017-07-15 LAB — CBC
HCT: 42.1 % (ref 40.0–52.0)
Hemoglobin: 14.3 g/dL (ref 13.0–18.0)
MCH: 30 pg (ref 26.0–34.0)
MCHC: 34 g/dL (ref 32.0–36.0)
MCV: 88.4 fL (ref 80.0–100.0)
PLATELETS: 261 10*3/uL (ref 150–440)
RBC: 4.76 MIL/uL (ref 4.40–5.90)
RDW: 13.1 % (ref 11.5–14.5)
WBC: 8.3 10*3/uL (ref 3.8–10.6)

## 2017-07-15 LAB — LIPASE, BLOOD: LIPASE: 22 U/L (ref 11–51)

## 2017-07-15 MED ORDER — PROMETHAZINE HCL 25 MG PO TABS: 25.0000 mg | ORAL_TABLET | Freq: Four times a day (QID) | ORAL | 0 refills | Status: DC | PRN

## 2017-07-15 MED ORDER — ONDANSETRON HCL 4 MG/2ML IJ SOLN
4.0000 mg | Freq: Once | INTRAMUSCULAR | Status: AC
Start: 2017-07-15 — End: 2017-07-15
  Administered 2017-07-15: 4 mg via INTRAVENOUS
  Filled 2017-07-15: qty 2

## 2017-07-15 MED ORDER — PROMETHAZINE HCL 25 MG RE SUPP: 25.0000 mg | Freq: Four times a day (QID) | RECTAL | 0 refills | Status: DC | PRN

## 2017-07-15 MED ORDER — SODIUM CHLORIDE 0.9 % IV BOLUS (SEPSIS)
1000.0000 mL | Freq: Once | INTRAVENOUS | Status: AC
  Administered 2017-07-15: 1000 mL via INTRAVENOUS

## 2017-07-15 MED ORDER — ONDANSETRON 4 MG PO TBDP
4.0000 mg | ORAL_TABLET | Freq: Once | ORAL | Status: AC
  Administered 2017-07-15: 4 mg via ORAL
  Filled 2017-07-15: qty 1

## 2017-07-15 NOTE — ED Notes (Signed)
Patient transported to X-ray 

## 2017-07-15 NOTE — ED Notes (Signed)
Pt verbalizes d/c teaching and RX. Pt in NAD at time of d/c, VS stable, pt ambulatory with family.

## 2017-07-15 NOTE — ED Notes (Signed)
Patient vomiting, med SL.

## 2017-07-15 NOTE — ED Triage Notes (Signed)
States has had malaise x week. States having daily nausea and vomiting x 4 days. States was worked up for same 2 days ago however did not mention had tick bite 2 weeks ago and is concerned that might be related

## 2017-07-15 NOTE — ED Provider Notes (Signed)
[facility] Emergency Department Provider Note  Time seen: 10:27 AM  I have reviewed the triage vital signs and the nursing notes.   HISTORY  Chief Complaint Fatigue and Abdominal Pain    HPI [name] is a 46 y.o. male with a past medical history of diabetes, hypertension, presents to the emergency department with continued nausea and vomiting.  According to the patient since this past Wednesday he has been nauseated with several episodes of vomiting each day.  States he has not been able to keep down food and very little fluids.  Is not been able to keep down his medications per patient.  Patient states diffuse abdominal cramping, but denies any "pain."  Denies any fever but states he has had some chills.  Denies dysuria.  Denies diarrhea.  Patient was seen in the emergency department 2 days ago for the same.  Was given fluids and nausea medication.  Discharged on nausea medication.  Patient states he has not been able to keep down his nausea medication due to vomiting.   Past Medical History:  Diagnosis Date  . Diabetes mellitus without complication (HCC)   . Hypertension     Patient Active Problem List   Diagnosis Date Noted  . Varicose veins with pain 07/06/2017  . Diabetes mellitus type 2 in obese (HCC) 03/13/2017  . Peripheral artery disease (HCC) 03/13/2017  . Chronic venous insufficiency 03/13/2017  . Hyperlipidemia 03/13/2017  . Essential hypertension 03/13/2017    Past Surgical History:  Procedure Laterality Date  . TOE AMPUTATION      Prior to Admission medications   Medication Sig Start Date End Date Taking? Authorizing Provider  Continuous Blood Gluc Sensor (FREESTYLE LIBRE SENSOR SYSTEM) MISC U UTD Q 10 DAYS. 06/07/17   [provider]  HYDROcodone-acetaminophen (NORCO) 5-325 MG tablet Take 1 tablet by mouth every 4 (four) hours as needed for moderate pain. 12/09/16   Willy Eddyobinson, Patrick, MD  insulin NPH-regular Human (NOVOLIN  70/30) (70-30) 100 UNIT/ML injection Inject 35 units in the breakfast and 60 units at supper 03/06/17   [provider]  lisinopril-hydrochlorothiazide (PRINZIDE,ZESTORETIC) 10-12.5 MG tablet TK 1 T PO QD 03/06/17   [provider]  ondansetron (ZOFRAN) 4 MG tablet Take 1 tablet (4 mg total) by mouth every 8 (eight) hours as needed for nausea or vomiting. 07/13/17   Governor RooksLord, Rebecca, MD  PARoxetine (PAXIL) 20 MG tablet Take by mouth. 03/06/17   [provider]  simvastatin (ZOCOR) 10 MG tablet Take by mouth. 07/26/16   [provider]    No Known Allergies  No family history on file.  Social History Social History  Substance Use Topics  . Smoking status: Never Smoker  . Smokeless tobacco: Never Used  . Alcohol use No    Review of Systems Constitutional: Negative for fever. Cardiovascular: Negative for chest pain. Respiratory: Negative for shortness of breath. Gastrointestinal: Intermittent abdominal cramping.  Positive for nausea and vomiting.  Negative for diarrhea. Genitourinary: Negative for dysuria. Neurological: Negative for headache All other ROS negative  ____________________________________________   PHYSICAL EXAM:  VITAL SIGNS: ED Triage Vitals [07/15/17 0818]  Enc Vitals Group     BP 129/72     Pulse Rate 85     Resp 18     Temp 97.8 F (36.6 C)     Temp Source Oral     SpO2 98 %     Weight 298 lb (135.2 kg)     Height 5\' 11"  (  1.803 m)     Head Circumference      Peak Flow      Pain Score      Pain Loc      Pain Edu?      Excl. in GC?     Constitutional: Alert and oriented. Well appearing and in no distress. Eyes: Normal exam ENT   Head: Normocephalic and atraumatic.   Mouth/Throat: Mucous membranes are moist. Cardiovascular: Normal rate, regular rhythm. No murmur Respiratory: Normal respiratory effort without tachypnea nor retractions. Breath sounds are clear  Gastrointestinal: Soft and nontender. No  distention. Musculoskeletal: Nontender with normal range of motion in all extremities. Neurologic:  Normal speech and language. No gross focal neurologic deficits Skin:  Skin is warm, dry and intact.  Psychiatric: Mood and affect are normal.  ____________________________________________   RADIOLOGY  X-ray normal  ____________________________________________   INITIAL IMPRESSION / ASSESSMENT AND PLAN / ED COURSE  Pertinent labs & imaging results that were available during my care of the patient were reviewed by me and considered in my medical decision making (see chart for details).  The patient presents to the emergency department for nausea and vomiting over the past 6 days.  Patient had been seen for the same 2 days ago but continues with nausea and vomiting.  States diffuse abdominal cramping but denies any "pain."  Patient has a nontender exam.  Differential at this time would include gastroenteritis, enteritis, SBO, pancreatitis, biliary disease, gastritis.  Patient has a nontender exam currently.  Overall he appears very well, no distress.  Normal anion gap but he does have a small amount of ketones in his urinalysis.  We will dose IV fluids as well as IV Zofran.  We will obtain an abdominal x-ray series to rule out obstructive gas pattern.  I reviewed the patient's records including his recent ER visit.  Labs are largely unchanged from that visit.  No acute findings on the labs.  X-rays negative for obstruction.  Overall the patient appears well has not vomited since being seen by myself.  I discussed with the patient a trial of Phenergan at home including oral and if needed suppository occasion as well as follow-up with a primary care doctor.  Patient agreeable to plan. ____________________________________________   FINAL CLINICAL IMPRESSION(S) / ED DIAGNOSES  Nausea vomiting    Minna Antis, MD 07/15/17 1359

## 2017-08-01 ENCOUNTER — Ambulatory Visit (INDEPENDENT_AMBULATORY_CARE_PROVIDER_SITE_OTHER): Payer: BLUE CROSS/BLUE SHIELD | Admitting: Vascular Surgery

## 2017-08-12 ENCOUNTER — Ambulatory Visit (INDEPENDENT_AMBULATORY_CARE_PROVIDER_SITE_OTHER): Payer: BLUE CROSS/BLUE SHIELD | Admitting: Vascular Surgery

## 2017-08-12 DIAGNOSIS — I83819 Varicose veins of unspecified lower extremities with pain: Secondary | ICD-10-CM

## 2017-08-12 DIAGNOSIS — E1169 Type 2 diabetes mellitus with other specified complication: Secondary | ICD-10-CM

## 2017-08-12 DIAGNOSIS — I872 Venous insufficiency (chronic) (peripheral): Secondary | ICD-10-CM | POA: Diagnosis not present

## 2017-08-12 DIAGNOSIS — I10 Essential (primary) hypertension: Secondary | ICD-10-CM

## 2017-08-12 DIAGNOSIS — E669 Obesity, unspecified: Secondary | ICD-10-CM | POA: Diagnosis not present

## 2017-08-12 DIAGNOSIS — E119 Type 2 diabetes mellitus without complications: Secondary | ICD-10-CM

## 2017-08-18 ENCOUNTER — Encounter (INDEPENDENT_AMBULATORY_CARE_PROVIDER_SITE_OTHER): Payer: Self-pay | Admitting: Vascular Surgery

## 2017-08-18 NOTE — Progress Notes (Signed)
MRN : [id number]  [name] is a 46 y.o. (1971/09/09) male who presents with chief complaint of No chief complaint on file. Marland Kitchen  History of Present Illness: The patient returns to the office for followup status post laser ablation of the left great saphenous vein on 07/04/2017. The patient notes multiple residual varicosities bilaterally which continued to hurt with dependent positions and remained tender to palpation. The patient's swelling is unchanged from preoperative status. The patient continues to wear graduated compression stockings on a daily basis but these are not eliminating the pain and discomfort. The patient continues to use over-the-counter anti-inflammatory medications to treat the pain and related symptoms but this has not given the patient relief. The patient notes the pain in the lower extremities is causing problems with daily exercise, problems at work and even with household activities such as preparing meals and doing dishes.  The patient is otherwise done well and there have been no complications related to the laser procedure or interval changes in the patient's overall   Venous ultrasound post laser shows successful laser ablation of the left GSV, no DVT identified.  No outpatient medications have been marked as taking for the 08/12/17 encounter (Office Visit) with Gilda Crease, Latina Craver, MD.    Past Medical History:  Diagnosis Date  . Diabetes mellitus without complication (HCC)   . Hypertension     Past Surgical History:  Procedure Laterality Date  . TOE AMPUTATION      Social History Social History   Tobacco Use  . Smoking status: Never Smoker  . Smokeless tobacco: Never Used  Substance Use Topics  . Alcohol use: No  . Drug use: No    Family History History reviewed. No pertinent family history.  No Known Allergies   REVIEW OF SYSTEMS (Negative unless checked)  Constitutional: Weight loss  Fever  Chills Cardiac: Chest pain    Chest pressure   Palpitations   Shortness of breath when laying flat   Shortness of breath with exertion. Vascular:  Pain in legs with walking   Pain in legs with standing  History of DVT   Phlebitis   Swelling in legs   Varicose veins   Non-healing ulcers Pulmonary:   Uses home oxygen   Productive cough   Hemoptysis   Wheeze  COPD   Asthma Neurologic:  Dizziness   Seizures   History of stroke   History of TIA  Aphasia   Vissual changes   Weakness or numbness in arm   Weakness or numbness in leg Musculoskeletal:   Joint swelling   Joint pain   Low back pain Hematologic:  Easy bruising  Easy bleeding   Hypercoagulable state   Anemic Gastrointestinal:  Diarrhea   Vomiting  Gastroesophageal reflux/heartburn   Difficulty swallowing. Genitourinary:  Chronic kidney disease   Difficult urination  Frequent urination   Blood in urine Skin:  Rashes   Ulcers  Psychological:  History of anxiety    History of major depression.  Physical Examination  There were no vitals filed for this visit. There is no height or weight on file to calculate BMI. Gen: WD/WN, NAD Head: Hart/AT, No temporalis wasting.  Ear/Nose/Throat: Hearing grossly intact, nares w/o erythema or drainage Eyes: PER, EOMI, sclera nonicteric.  Neck: Supple, no large masses.   Pulmonary:  Good air movement, no audible wheezing bilaterally, no use of accessory muscles.  Cardiac: RRR, no JVD Vascular: Large varicosities present extensively greater than 10 mm left leg.  Mild  venous stasis changes to the legs bilaterally.  2+ soft pitting edema Vessel Right Left  Radial Palpable Palpable  PT Palpable Palpable  DP Palpable Palpable  Gastrointestinal: Non-distended. No guarding/no peritoneal signs.  Musculoskeletal: M/S 5/5 throughout.  No deformity or atrophy.  Neurologic: CN 2-12 intact. Symmetrical.  Speech is fluent. Motor exam as listed  above. Psychiatric: Judgment intact, Mood & affect appropriate for pt's clinical situation. Dermatologic: venous rashes no ulcers noted.  No changes consistent with cellulitis. Lymph : No lichenification or skin changes of chronic lymphedema.  CBC Lab Results  Component Value Date   WBC 8.3 07/15/2017   HGB 14.3 07/15/2017   HCT 42.1 07/15/2017   MCV 88.4 07/15/2017   PLT 261 07/15/2017    BMET    Component Value Date/Time   NA 132 (L) 07/15/2017 0819   NA 131 (L) 08/22/2013 0454   K 3.9 07/15/2017 0819   K 4.6 08/22/2013 0454   CL 97 (L) 07/15/2017 0819   CL 99 08/22/2013 0454   CO2 27 07/15/2017 0819   CO2 28 08/22/2013 0454   GLUCOSE 249 (H) 07/15/2017 0819   GLUCOSE 131 (H) 08/22/2013 0454   BUN 21 (H) 07/15/2017 0819   BUN 31 (H) 08/22/2013 0454   CREATININE 1.35 (H) 07/15/2017 0819   CREATININE 1.50 (H) 08/23/2013 0415   CALCIUM 9.3 07/15/2017 0819   CALCIUM 9.3 08/22/2013 0454   GFRNONAA >60 07/15/2017 0819   GFRNONAA 56 (L) 08/23/2013 0415   GFRAA >60 07/15/2017 0819   GFRAA >60 08/23/2013 0415   CrCl cannot be calculated (Patient's most recent lab result is older than the maximum 21 days allowed.).  COAG No results found for: INR, PROTIME  Radiology No results found.  Assessment/Plan 1. Varicose veins with pain Recommend:  The patient has had successful ablation of the previously incompetent saphenous venous system but still has persistent symptoms of pain and swelling that are having a negative impact on daily life and daily activities.  Patient should undergo injection sclerotherapy to treat the residual varicosities.  The risks, benefits and alternative therapies were reviewed in detail with the patient.  All questions were answered.  The patient agrees to proceed with sclerotherapy at their convenience.  The patient will continue wearing the graduated compression stockings and using the over-the-counter pain medications to treat her symptoms.      2. Chronic venous insufficiency No surgery or intervention at this point in time.    I have had a long discussion with the patient regarding venous insufficiency and why it  causes symptoms. I have discussed with the patient the chronic skin changes that accompany venous insufficiency and the long term sequela such as infection and ulceration.  Patient will begin wearing graduated compression stockings class 1 (20-30 mmHg) or compression wraps on a daily basis a prescription was given. The patient will put the stockings on first thing in the morning and removing them in the evening. The patient is instructed specifically not to sleep in the stockings.    In addition, behavioral modification including several periods of elevation of the lower extremities during the day will be continued. I have demonstrated that proper elevation is a position with the ankles at heart level.  The patient is instructed to begin routine exercise, especially walking on a daily basis  Following the review of the ultrasound the patient will follow up in 2-3 months to reassess the degree of swelling and the control that graduated compression stockings or compression wraps  is offering.   The patient can be assessed for a Lymph Pump at that time  3. Essential hypertension Continue antihypertensive medications as already ordered, these medications have been reviewed and there are no changes at this time.   4. Diabetes mellitus type 2 in obese (HCC) Continue hypoglycemic medications as already ordered, these medications have been reviewed and there are no changes at this time.  Hgb A1C to be monitored as already arranged by primary service     Levora Dredge, MD  08/18/2017 4:57 PM

## 2017-09-03 ENCOUNTER — Emergency Department
Admission: EM | Admit: 2017-09-03 | Discharge: 2017-09-03 | Disposition: A | Discharge status: Home / Self Care | Payer: BLUE CROSS/BLUE SHIELD | Attending: Emergency Medicine | Admitting: Emergency Medicine

## 2017-09-03 ENCOUNTER — Other Ambulatory Visit: Payer: Self-pay

## 2017-09-03 ENCOUNTER — Emergency Department: Payer: BLUE CROSS/BLUE SHIELD

## 2017-09-03 ENCOUNTER — Encounter: Payer: Self-pay | Admitting: Emergency Medicine

## 2017-09-03 DIAGNOSIS — E860 Dehydration: Secondary | ICD-10-CM | POA: Diagnosis not present

## 2017-09-03 DIAGNOSIS — Z79899 Other long term (current) drug therapy: Secondary | ICD-10-CM | POA: Diagnosis not present

## 2017-09-03 DIAGNOSIS — E119 Type 2 diabetes mellitus without complications: Secondary | ICD-10-CM | POA: Insufficient documentation

## 2017-09-03 DIAGNOSIS — R4781 Slurred speech: Secondary | ICD-10-CM | POA: Insufficient documentation

## 2017-09-03 DIAGNOSIS — R42 Dizziness and giddiness: Secondary | ICD-10-CM | POA: Diagnosis present

## 2017-09-03 DIAGNOSIS — Z794 Long term (current) use of insulin: Secondary | ICD-10-CM | POA: Insufficient documentation

## 2017-09-03 DIAGNOSIS — I10 Essential (primary) hypertension: Secondary | ICD-10-CM | POA: Insufficient documentation

## 2017-09-03 LAB — CBC
HEMATOCRIT: 39.5 % — AB (ref 40.0–52.0)
HEMOGLOBIN: 13.5 g/dL (ref 13.0–18.0)
MCH: 29.9 pg (ref 26.0–34.0)
MCHC: 34 g/dL (ref 32.0–36.0)
MCV: 87.7 fL (ref 80.0–100.0)
Platelets: 251 10*3/uL (ref 150–440)
RBC: 4.51 MIL/uL (ref 4.40–5.90)
RDW: 13.5 % (ref 11.5–14.5)
WBC: 10.6 10*3/uL (ref 3.8–10.6)

## 2017-09-03 LAB — BASIC METABOLIC PANEL
ANION GAP: 12 (ref 5–15)
BUN: 26 mg/dL — AB (ref 6–20)
CO2: 22 mmol/L (ref 22–32)
Calcium: 8.8 mg/dL — ABNORMAL LOW (ref 8.9–10.3)
Chloride: 98 mmol/L — ABNORMAL LOW (ref 101–111)
Creatinine, Ser: 1.38 mg/dL — ABNORMAL HIGH (ref 0.61–1.24)
GFR calc Af Amer: >60 mL/min (ref >60)
GFR, EST NON AFRICAN AMERICAN: 60 mL/min — AB (ref >60)
Glucose, Bld: 372 mg/dL — ABNORMAL HIGH (ref 65–99)
POTASSIUM: 3.9 mmol/L (ref 3.5–5.1)
SODIUM: 132 mmol/L — AB (ref 135–145)

## 2017-09-03 LAB — GLUCOSE, CAPILLARY
Glucose-Capillary: 367 mg/dL — ABNORMAL HIGH (ref 65–99)
Glucose-Capillary: 215 mg/dL — ABNORMAL HIGH (ref 65–99)

## 2017-09-03 MED ORDER — SODIUM CHLORIDE 0.9 % IV BOLUS (SEPSIS)
1000.0000 mL | Freq: Once | INTRAVENOUS | Status: AC
  Administered 2017-09-03: 1000 mL via INTRAVENOUS

## 2017-09-03 NOTE — ED Notes (Signed)
Patient transported to MRI 

## 2017-09-03 NOTE — ED Provider Notes (Signed)
Clinical Course as of Sep 04 1831  Tue Sep 03, 2017  1654 MRI result reviewed, suggestive of MS. D/w neuro Dr. Thad Ranger, advises that if he has acute vision changes or is functionally impaired he would warrant hospitalization for expedited workup and consideration of high dose steroids.   Re-evaluated pt. Feels sx have improved. Vision change has resolved, and at baseline given his diabetic retinopathy.  Weakness improved. Pt has received IVF bolus.   Has 5/5 strength in all 4 extremities. Nl finger-nose. Nl heel-shin. CN 3-12 intact. Fundoscopy reveals sharp optic discs bilaterally without cupping. Pt has appt with PCP Dr. Harrington Challenger tomorrow.  Will ambulate and recheck FS. If steady, pt is suitable for DC home with return precautions.  [PS]    Clinical Course User Index [PS] Sharman Cheek, MD    ----------------------------------------- 6:33 PM on 09/03/2017 -----------------------------------------  Patient feels better after IV fluids. Ambulating with steady gait. Comfortable with going home and continuing outpatient follow-up. Return precautions for any worsening symptoms.   Final diagnoses:  Dehydration  Slurred speech  suspected multiple sclerosis    Sharman Cheek, MD 09/03/17 [id number]

## 2017-09-03 NOTE — ED Provider Notes (Signed)
[facility] Emergency Department Provider Note ____________________________________________   I have reviewed the triage vital signs and the triage nursing note.  HISTORY  Chief Complaint Dizziness and Blurred Vision   Historian Patient  HPI [name] is a 46 y.o. male with a history of diabetes and hypertension, presents stating he is concerned he may have had a minor stroke.  Patient states that he feels like he has slurred speech or his speech is not quite right and he feels lightheaded and dizzy.  No specific infectious symptoms or fevers.  No volume loss including diarrhea or decreased p.o. intake or vomiting.  No coughing or trouble breathing.  No reported bloody or black stools.  Patient states that a couple weeks ago he felt like his speech was not quite right, but to him it seems like yesterday evening it seemed worse and is continued this morning.  He does think he might be a little dehydrated.  No urinary symptoms.  No new medications or changes in medication doses.   Past Medical History:  Diagnosis Date  . Diabetes mellitus without complication (HCC)   . Hypertension     Patient Active Problem List   Diagnosis Date Noted  . Varicose veins with pain 07/06/2017  . Diabetes mellitus type 2 in obese (HCC) 03/13/2017  . Peripheral artery disease (HCC) 03/13/2017  . Chronic venous insufficiency 03/13/2017  . Hyperlipidemia 03/13/2017  . Essential hypertension 03/13/2017    Past Surgical History:  Procedure Laterality Date  . TOE AMPUTATION      Prior to Admission medications   Medication Sig Start Date End Date Taking? Authorizing Provider  Continuous Blood Gluc Sensor (FREESTYLE LIBRE SENSOR SYSTEM) MISC U UTD Q 10 DAYS. 06/07/17   [provider]  HYDROcodone-acetaminophen (NORCO) 5-325 MG tablet Take 1 tablet by mouth every 4 (four) hours as needed for moderate pain. Patient not taking: Reported on 07/15/2017 12/09/16    Willy Eddy, MD  ibuprofen (ADVIL,MOTRIN) 200 MG tablet Take 200 mg by mouth every 6 (six) hours as needed.    [provider]  insulin NPH-regular Human (NOVOLIN 70/30) (70-30) 100 UNIT/ML injection Inject 35 units in the breakfast and 50 units at supper 03/06/17   [provider]  lisinopril-hydrochlorothiazide (PRINZIDE,ZESTORETIC) 10-12.5 MG tablet Take 1 tablet by mouth daily 03/06/17   [provider]  ondansetron (ZOFRAN) 4 MG tablet Take 1 tablet (4 mg total) by mouth every 8 (eight) hours as needed for nausea or vomiting. 07/13/17   Governor Rooks, MD  PARoxetine (PAXIL) 20 MG tablet Take 20 mg by mouth daily.  03/06/17   [provider]  promethazine (PHENERGAN) 25 MG suppository Place 1 suppository (25 mg total) rectally every 6 (six) hours as needed for nausea. 07/15/17 07/15/18  Minna Antis, MD  promethazine (PHENERGAN) 25 MG tablet Take 1 tablet (25 mg total) by mouth every 6 (six) hours as needed for nausea or vomiting. 07/15/17   Minna Antis, MD    No Known Allergies  No family history on file.  Social History Social History   Tobacco Use  . Smoking status: Never Smoker  . Smokeless tobacco: Never Used  Substance Use Topics  . Alcohol use: No  . Drug use: No    Review of Systems  Constitutional: Negative for fever. Eyes: Negative for visual changes. ENT: Negative for sore throat. Cardiovascular: Negative for chest pain. Respiratory: Negative for shortness of breath. Gastrointestinal: Negative for abdominal pain, vomiting and diarrhea. Genitourinary: Negative for  dysuria. Musculoskeletal: Negative for back pain. Skin: Negative for rash. Neurological: Negative for headache.  ____________________________________________   PHYSICAL EXAM:  VITAL SIGNS: ED Triage Vitals  Enc Vitals Group     BP 09/03/17 1056 122/67     Pulse Rate 09/03/17 1056 100     Resp 09/03/17 1056 18     Temp 09/03/17 1056 98.6 F (37  C)     Temp Source 09/03/17 1056 Oral     SpO2 09/03/17 1056 100 %     Weight 09/03/17 1057 290 lb (131.5 kg)     Height 09/03/17 1057 5\' 10"  (1.778 m)     Head Circumference --      Peak Flow --      Pain Score 09/03/17 1056 0     Pain Loc --      Pain Edu? --      Excl. in GC? --      Constitutional: Alert and oriented. Well appearing and in no distress. HEENT   Head: Normocephalic and atraumatic.      Eyes: Conjunctivae are normal. Pupils equal and round.       Ears:         Nose: No congestion/rhinnorhea.   Mouth/Throat: Mucous membranes are dry.   Neck: No stridor. Cardiovascular/Chest: Normal rate, regular rhythm.  No murmurs, rubs, or gallops. Respiratory: Normal respiratory effort without tachypnea nor retractions. Breath sounds are clear and equal bilaterally. No wheezes/rales/rhonchi. Gastrointestinal: Soft. No distention, no guarding, no rebound. Nontender.    Genitourinary/rectal:Deferred Musculoskeletal: Nontender with normal range of motion in all extremities. No joint effusions.  No lower extremity tenderness.  No edema. Neurologic: No facial droop.  Patient seems like he has a little bit of a speech impediment but not so much slurred speech per se.  Normal content language.  5 strength in 4 extremities.  Normal coordination.  No gross or focal neurologic deficits are appreciated. Skin:  Skin is warm, dry and intact. No rash noted. Psychiatric: Mood and affect are normal. Speech and behavior are normal. Patient exhibits appropriate insight and judgment.   ____________________________________________  LABS (pertinent positives/negatives) I, Governor Rooks, MD the attending physician have reviewed the labs noted below.  Labs Reviewed  BASIC METABOLIC PANEL - Abnormal; Notable for the following components:      Result Value   Sodium 132 (*)    Chloride 98 (*)    Glucose, Bld 372 (*)    BUN 26 (*)    Creatinine, Ser 1.38 (*)    Calcium 8.8 (*)    GFR  calc non Af Amer 60 (*)    All other components within normal limits  CBC - Abnormal; Notable for the following components:   HCT 39.5 (*)    All other components within normal limits  GLUCOSE, CAPILLARY - Abnormal; Notable for the following components:   Glucose-Capillary 367 (*)    All other components within normal limits  URINALYSIS, COMPLETE (UACMP) WITH MICROSCOPIC    ____________________________________________    EKG I, Governor Rooks, MD, the attending physician have personally viewed and interpreted all ECGs.  108 bpm.  Sinus tachycardia.  Narrow QS.  Normal axis.  Normal ST and T wave. ____________________________________________  RADIOLOGY All Xrays were viewed by me.  Imaging interpreted by Radiologist, and I, Governor Rooks, MD the attending physician have reviewed the radiologist interpretation noted below.  CT head without contrast:   IMPRESSION: Mild atrophy and minimal small vessel ischemic change. No acute intracranial abnormality.  MRI  brain without contrast: Pending __________________________________________  PROCEDURES  Procedure(s) performed: None  Critical Care performed: None   ____________________________________________  ED COURSE / ASSESSMENT AND PLAN  Pertinent labs & imaging results that were available during my care of the patient were reviewed by me and considered in my medical decision making (see chart for details).   Patient presents feeling possibly dehydrated versus feel like he might of had a stroke.  He states that he has been a little fatigued and then noticed some slurred speech yesterday evening although he has had some episodes like that a couple weeks ago for which she was never evaluated, he states it seemed worse last night.  From my perspective he does not have a clear facial droop or any focal neurologic deficit although he does seem to have a bit of a speech impediment, which I guess per him seems to be new.  His mouth  looks a little bit dry, the rest of his laboratory studies are overall reassuring without anemia or acute renal failure or electrolyte disturbance.  He had a little tachycardia on his initial EKG, will give a bag of fluids.  I discussed with him that I will obtain MRI to ensure there is no evidence for an acute stroke with respect to the mild dizziness and slurred speech change that he noticed since last 24 hours.  Patient care to be transferred to Dr. Scotty CourtStafford at shift change 3 PM.  Disposition per IV fluids, orthostatics, and MRI of the brain.  I have prepared discharge instructions and have spoken with the patient about if his MRI are negative.  If MRI is negative and patient is reassuring, patient may be discharged with my prepared discharge instructions.  DIFFERENTIAL DIAGNOSIS: Including but not limited to stroke, dehydration, electrolyte disturbance, anemia, medication side effect, etc.  CONSULTATIONS:   None   Patient / Family / Caregiver informed of clinical course, medical decision-making process, and agree with plan.   I discussed return precautions, follow-up instructions, and discharge instructions with patient and/or family.  Discharge Instructions : You are evaluated for fatigue and dizziness and slurred speech, and as discussed no certain cause was found, however your exam and evaluation are overall reassuring in the emergency department today.  You are given IV fluids for likely component of dehydration.  Make sure you are taking plenty fluids.  Return to the emergency department immediately for any worsening condition including confusion or altered mental status, fever, dizziness or passing out, heart racing, chest pain, one-sided weakness or numbness or slurred speech or facial droop, or any other symptoms concerning to you.    ___________________________________________   FINAL CLINICAL IMPRESSION(S) / ED DIAGNOSES   Final diagnoses:  Dehydration  Slurred speech       ___________________________________________        Note: This dictation was prepared with Dragon dictation. Any transcriptional errors that result from this process are unintentional    Governor RooksLord, Omaira Mellen, MD 09/03/17 1431

## 2017-09-03 NOTE — ED Notes (Signed)
Dr stafford at bedside to update pt and family 

## 2017-09-03 NOTE — ED Triage Notes (Signed)
Pt to ed with c/o blurred vision and poor balance that started this am when he awoke.  Pt reports dizziness, and weakness in all extremities.

## 2017-09-03 NOTE — Discharge Instructions (Addendum)
You are evaluated for fatigue and dizziness and slurred speech, and your exam and evaluation are overall reassuring in the emergency department today.   You are given IV fluids for likely component of dehydration.  Make sure you are taking plenty fluids.  Return to the emergency department immediately for any worsening condition including confusion or altered mental status, fever, dizziness or passing out, heart racing, chest pain, one-sided weakness or numbness or slurred speech or facial droop, or any other symptoms concerning to you.  Your MRI of the brain shows some signs of possible Multiple Sclerosis.  You should follow up with your doctor tomorrow for further evaluation of this possibility and to arrange follow up with neurology. Continue taking all of your medications.  Results for orders placed or performed during the hospital encounter of 09/03/17  Basic metabolic panel  Result Value Ref Range   Sodium 132 (L) 135 - 145 mmol/L   Potassium 3.9 3.5 - 5.1 mmol/L   Chloride 98 (L) 101 - 111 mmol/L   CO2 22 22 - 32 mmol/L   Glucose, Bld 372 (H) 65 - 99 mg/dL   BUN 26 (H) 6 - 20 mg/dL   Creatinine, Ser 1.611.38 (H) 0.61 - 1.24 mg/dL   Calcium 8.8 (L) 8.9 - 10.3 mg/dL   GFR calc non Af Amer 60 (L) >60 mL/min   GFR calc Af Amer >60 >60 mL/min   Anion gap 12 5 - 15  CBC  Result Value Ref Range   WBC 10.6 3.8 - 10.6 K/uL   RBC 4.51 4.40 - 5.90 MIL/uL   Hemoglobin 13.5 13.0 - 18.0 g/dL   HCT 09.639.5 (L) 04.540.0 - 40.952.0 %   MCV 87.7 80.0 - 100.0 fL   MCH 29.9 26.0 - 34.0 pg   MCHC 34.0 32.0 - 36.0 g/dL   RDW 81.113.5 91.411.5 - 78.214.5 %   Platelets 251 150 - 440 K/uL  Glucose, capillary  Result Value Ref Range   Glucose-Capillary 367 (H) 65 - 99 mg/dL  Glucose, capillary  Result Value Ref Range   Glucose-Capillary 215 (H) 65 - 99 mg/dL   Ct Head Wo Contrast  Result Date: 09/03/2017 CLINICAL DATA:  Blurred vision, poor balance today, dizziness and weakness, no injury EXAM: CT HEAD WITHOUT CONTRAST  TECHNIQUE: Contiguous axial images were obtained from the base of the skull through the vertex without intravenous contrast. COMPARISON:  None. FINDINGS: Brain: The ventricular system is diffusely prominent as are the cortical sulci, indicative of diffuse atrophy. The septum is midline in position. The fourth ventricle and basilar cisterns are unremarkable. No hemorrhage, mass lesion, or acute infarction is seen. There appears to be very mild small vessel ischemic change within the periventricular white matter. Vascular: No vascular abnormality is noted on this unenhanced study. Skull: On bone window images, no calvarial abnormality is seen. Sinuses/Orbits: The paranasal sinuses appear pneumatized with a probable retention cyst in the floor of the left maxillary sinus posteriorly. Other: None. IMPRESSION: Mild atrophy and minimal small vessel ischemic change. No acute intracranial abnormality. Electronically Signed   By: Dwyane DeePaul  Barry M.D.   On: 09/03/2017 14:05   Mr Brain Wo Contrast (neuro Protocol)  Result Date: 09/03/2017 CLINICAL DATA:  [age] year old male with abnormal speech, dizziness and lightheadedness symptoms. Patient concerned he has had a stroke. History of Diabetes and hypertension. EXAM: MRI HEAD WITHOUT CONTRAST TECHNIQUE: Multiplanar, multiecho pulse sequences of the brain and surrounding structures were obtained without intravenous contrast. COMPARISON:  Head CT  without contrast 1356 hours today. FINDINGS: The examination had to be discontinued prior to completion due to shoulder pain, refusal to continue. Only axial T1 and coronal weighted T2 imaging could not be obtained. Brain: Multiple areas of mild T2 shine through related to bilateral cerebral white matter lesions. No restricted diffusion to suggest acute infarction. Bilateral nodular T2 and FLAIR hyperintense areas in the cerebral white matter measuring up to about a cm in size, with indistinct margins. There is periventricular and  subcortical white matter involvement bilaterally. There is bilateral temporal lobe involvement. The extent is moderate, and only a few of these lesions were evident by CT today. There is a lesion along the posterior margin of the left deep gray matter nuclei and posterior deep left white matter capsules. There is involvement of the deep cerebellar nuclei, involvement of the left cerebellar peduncle (series 8, image 13), and the right lateral pontomedullary junction. No definite cortical involvement. No cortical encephalomalacia or chronic cerebral blood products. No midline shift, mass effect, evidence of mass lesion, ventriculomegaly, extra-axial collection or acute intracranial hemorrhage. Cervicomedullary junction and pituitary are within normal limits. Vascular: Major intracranial vascular flow voids are preserved, the distal left vertebral artery appears dominant. Skull and upper cervical spine: Grossly negative visualized cervical spinal cord. Normal bone marrow signal. Sinuses/Orbits: Postoperative changes to both globes. Questionable abnormal increased left optic nerve T2 signal (series 7, image 10). Negative visible orbits soft tissues otherwise. Visualized paranasal sinuses and mastoids are well pneumatized ; mild left maxillary sinus mucosal thickening or retention cyst. Other: Visible internal auditory structures appear normal. Grossly negative scalp and face soft tissues. IMPRESSION: 1. Abnormal signal in the bilateral cerebral white matter, cerebellum, and lower brainstem with possible superimposed left optic neuritis. Findings highly suspicious for Multiple Sclerosis. 2. No acute infarct or other acute intracranial abnormality. Electronically Signed   By: Odessa Fleming M.D.   On: 09/03/2017 15:59

## 2017-09-03 NOTE — ED Notes (Signed)
Patient transported to CT 

## 2017-09-03 NOTE — ED Notes (Signed)
Pt ambulated in hall. Wife reports gait is better than earlier today. Pt has steady gait but walks as though has some difficulty lifting legs, like heavy/weak.  Ambulated without assistance.

## 2017-09-03 NOTE — ED Notes (Signed)
Dr lord at bedside 

## 2017-09-12 ENCOUNTER — Other Ambulatory Visit: Payer: Self-pay | Admitting: Neurology

## 2017-09-12 DIAGNOSIS — G35 Multiple sclerosis: Secondary | ICD-10-CM

## 2017-09-20 ENCOUNTER — Other Ambulatory Visit: Payer: Self-pay | Admitting: Neurology

## 2017-09-20 DIAGNOSIS — G35 Multiple sclerosis: Secondary | ICD-10-CM

## 2017-09-23 ENCOUNTER — Other Ambulatory Visit: Payer: Self-pay | Admitting: Internal Medicine

## 2017-09-26 ENCOUNTER — Ambulatory Visit
Admission: RE | Admit: 2017-09-26 | Discharge: 2017-09-26 | Disposition: A | Discharge status: Home / Self Care | Payer: BLUE CROSS/BLUE SHIELD | Source: Ambulatory Visit | Attending: Neurology | Admitting: Neurology

## 2017-09-26 DIAGNOSIS — M4802 Spinal stenosis, cervical region: Secondary | ICD-10-CM | POA: Diagnosis not present

## 2017-09-26 DIAGNOSIS — R9089 Other abnormal findings on diagnostic imaging of central nervous system: Secondary | ICD-10-CM | POA: Insufficient documentation

## 2017-09-26 DIAGNOSIS — G35 Multiple sclerosis: Secondary | ICD-10-CM | POA: Diagnosis not present

## 2017-09-26 DIAGNOSIS — M47812 Spondylosis without myelopathy or radiculopathy, cervical region: Secondary | ICD-10-CM | POA: Insufficient documentation

## 2017-10-01 ENCOUNTER — Inpatient Hospital Stay
Admission: AD | Admit: 2017-10-01 | Discharge: 2017-10-04 | DRG: 617 | Disposition: A | Discharge status: Home Health | Payer: BLUE CROSS/BLUE SHIELD | Source: Ambulatory Visit | Attending: Internal Medicine | Admitting: Internal Medicine

## 2017-10-01 ENCOUNTER — Other Ambulatory Visit: Payer: Self-pay

## 2017-10-01 ENCOUNTER — Ambulatory Visit: Payer: BLUE CROSS/BLUE SHIELD

## 2017-10-01 DIAGNOSIS — Z6837 Body mass index (BMI) 37.0-37.9, adult: Secondary | ICD-10-CM

## 2017-10-01 DIAGNOSIS — E1140 Type 2 diabetes mellitus with diabetic neuropathy, unspecified: Secondary | ICD-10-CM | POA: Diagnosis present

## 2017-10-01 DIAGNOSIS — R296 Repeated falls: Secondary | ICD-10-CM | POA: Diagnosis present

## 2017-10-01 DIAGNOSIS — E860 Dehydration: Secondary | ICD-10-CM | POA: Diagnosis present

## 2017-10-01 DIAGNOSIS — Z79899 Other long term (current) drug therapy: Secondary | ICD-10-CM

## 2017-10-01 DIAGNOSIS — E871 Hypo-osmolality and hyponatremia: Secondary | ICD-10-CM | POA: Diagnosis present

## 2017-10-01 DIAGNOSIS — I10 Essential (primary) hypertension: Secondary | ICD-10-CM | POA: Diagnosis present

## 2017-10-01 DIAGNOSIS — M86179 Other acute osteomyelitis, unspecified ankle and foot: Secondary | ICD-10-CM | POA: Diagnosis present

## 2017-10-01 DIAGNOSIS — E1151 Type 2 diabetes mellitus with diabetic peripheral angiopathy without gangrene: Secondary | ICD-10-CM | POA: Diagnosis present

## 2017-10-01 DIAGNOSIS — E11621 Type 2 diabetes mellitus with foot ulcer: Secondary | ICD-10-CM | POA: Diagnosis present

## 2017-10-01 DIAGNOSIS — Z794 Long term (current) use of insulin: Secondary | ICD-10-CM | POA: Diagnosis not present

## 2017-10-01 DIAGNOSIS — E785 Hyperlipidemia, unspecified: Secondary | ICD-10-CM | POA: Diagnosis present

## 2017-10-01 DIAGNOSIS — M86172 Other acute osteomyelitis, left ankle and foot: Secondary | ICD-10-CM

## 2017-10-01 DIAGNOSIS — E1169 Type 2 diabetes mellitus with other specified complication: Principal | ICD-10-CM | POA: Diagnosis present

## 2017-10-01 DIAGNOSIS — R262 Difficulty in walking, not elsewhere classified: Secondary | ICD-10-CM

## 2017-10-01 DIAGNOSIS — L97529 Non-pressure chronic ulcer of other part of left foot with unspecified severity: Secondary | ICD-10-CM | POA: Diagnosis present

## 2017-10-01 DIAGNOSIS — G35 Multiple sclerosis: Secondary | ICD-10-CM | POA: Diagnosis present

## 2017-10-01 DIAGNOSIS — E669 Obesity, unspecified: Secondary | ICD-10-CM | POA: Diagnosis present

## 2017-10-01 DIAGNOSIS — M868X7 Other osteomyelitis, ankle and foot: Secondary | ICD-10-CM | POA: Diagnosis present

## 2017-10-01 LAB — CBC WITH DIFFERENTIAL/PLATELET
BASOS PCT: 1 %
Basophils Absolute: 0 10*3/uL (ref 0–0.1)
EOS ABS: 0.1 10*3/uL (ref 0–0.7)
Eosinophils Relative: 2 %
HCT: 34.5 % — ABNORMAL LOW (ref 40.0–52.0)
HEMOGLOBIN: 12.1 g/dL — AB (ref 13.0–18.0)
LYMPHS ABS: 1.4 10*3/uL (ref 1.0–3.6)
Lymphocytes Relative: 18 %
MCH: 29.8 pg (ref 26.0–34.0)
MCHC: 34.9 g/dL (ref 32.0–36.0)
MCV: 85.4 fL (ref 80.0–100.0)
Monocytes Absolute: 0.6 10*3/uL (ref 0.2–1.0)
Monocytes Relative: 7 %
Neutro Abs: 5.4 10*3/uL (ref 1.4–6.5)
Neutrophils Relative %: 72 %
PLATELETS: 285 10*3/uL (ref 150–440)
RBC: 4.05 MIL/uL — ABNORMAL LOW (ref 4.40–5.90)
RDW: 13.2 % (ref 11.5–14.5)
WBC: 7.5 10*3/uL (ref 3.8–10.6)

## 2017-10-01 LAB — BASIC METABOLIC PANEL
Anion gap: 10 (ref 5–15)
BUN: 24 mg/dL — AB (ref 6–20)
CO2: 23 mmol/L (ref 22–32)
CREATININE: 1.23 mg/dL (ref 0.61–1.24)
Calcium: 9.1 mg/dL (ref 8.9–10.3)
Chloride: 96 mmol/L — ABNORMAL LOW (ref 101–111)
GFR calc Af Amer: >60 mL/min (ref >60)
GLUCOSE: 331 mg/dL — AB (ref 65–99)
POTASSIUM: 4.4 mmol/L (ref 3.5–5.1)
SODIUM: 129 mmol/L — AB (ref 135–145)

## 2017-10-01 LAB — GLUCOSE, CAPILLARY
GLUCOSE-CAPILLARY: 276 mg/dL — AB (ref 65–99)
GLUCOSE-CAPILLARY: 162 mg/dL — AB (ref 65–99)
Glucose-Capillary: 306 mg/dL — ABNORMAL HIGH (ref 65–99)

## 2017-10-01 MED ORDER — ACETAMINOPHEN 650 MG RE SUPP: 650.0000 mg | Freq: Four times a day (QID) | RECTAL | Status: DC | PRN

## 2017-10-01 MED ORDER — VANCOMYCIN HCL 10 G IV SOLR
2000.0000 mg | Freq: Once | INTRAVENOUS | Status: AC
  Administered 2017-10-01: 2000 mg via INTRAVENOUS
  Filled 2017-10-01: qty 2000

## 2017-10-01 MED ORDER — PIPERACILLIN-TAZOBACTAM 3.375 G IVPB
3.3750 g | Freq: Three times a day (TID) | INTRAVENOUS | Status: DC
Start: 2017-10-01 — End: 2017-10-04
  Administered 2017-10-01 – 2017-10-04 (×9): 3.375 g via INTRAVENOUS
  Filled 2017-10-01 (×8): qty 50

## 2017-10-01 MED ORDER — INSULIN ASPART PROT & ASPART (70-30 MIX) 100 UNIT/ML ~~LOC~~ SUSP: 35.0000 [IU] | Freq: Every day | SUBCUTANEOUS | Status: DC

## 2017-10-01 MED ORDER — INSULIN ASPART 100 UNIT/ML ~~LOC~~ SOLN: 0.0000 [IU] | Freq: Three times a day (TID) | SUBCUTANEOUS | Status: DC

## 2017-10-01 MED ORDER — INSULIN GLARGINE 100 UNIT/ML ~~LOC~~ SOLN
40.0000 [IU] | Freq: Every day | SUBCUTANEOUS | Status: DC
Start: 2017-10-01 — End: 2017-10-02
  Administered 2017-10-01: 40 [IU] via SUBCUTANEOUS
  Filled 2017-10-01 (×2): qty 0.4

## 2017-10-01 MED ORDER — PAROXETINE HCL 20 MG PO TABS
20.0000 mg | ORAL_TABLET | Freq: Every day | ORAL | Status: DC
  Administered 2017-10-02 – 2017-10-04 (×3): 20 mg via ORAL
  Filled 2017-10-01 (×4): qty 1

## 2017-10-01 MED ORDER — INSULIN ASPART 100 UNIT/ML ~~LOC~~ SOLN: 7.0000 [IU] | Freq: Once | SUBCUTANEOUS | Status: DC

## 2017-10-01 MED ORDER — VANCOMYCIN HCL 10 G IV SOLR
1500.0000 mg | Freq: Two times a day (BID) | INTRAVENOUS | Status: DC
  Administered 2017-10-02 (×3): 1500 mg via INTRAVENOUS
  Filled 2017-10-01 (×6): qty 1500

## 2017-10-01 MED ORDER — SODIUM CHLORIDE 0.9% FLUSH
3.0000 mL | Freq: Two times a day (BID) | INTRAVENOUS | Status: DC
  Administered 2017-10-01 – 2017-10-02 (×3): 3 mL via INTRAVENOUS

## 2017-10-01 MED ORDER — ONDANSETRON HCL 4 MG/2ML IJ SOLN: 4.0000 mg | Freq: Four times a day (QID) | INTRAMUSCULAR | Status: DC | PRN

## 2017-10-01 MED ORDER — INSULIN ASPART 100 UNIT/ML ~~LOC~~ SOLN
5.0000 [IU] | Freq: Three times a day (TID) | SUBCUTANEOUS | Status: DC
Start: 2017-10-01 — End: 2017-10-03
  Administered 2017-10-01 – 2017-10-03 (×4): 5 [IU] via SUBCUTANEOUS
  Filled 2017-10-01 (×4): qty 1

## 2017-10-01 MED ORDER — INSULIN ASPART 100 UNIT/ML ~~LOC~~ SOLN
SUBCUTANEOUS | Status: AC
  Administered 2017-10-01: 7 [IU]
  Filled 2017-10-01: qty 1

## 2017-10-01 MED ORDER — LISINOPRIL 10 MG PO TABS
10.0000 mg | ORAL_TABLET | Freq: Every day | ORAL | Status: DC
  Administered 2017-10-02 – 2017-10-04 (×3): 10 mg via ORAL
  Filled 2017-10-01 (×3): qty 1

## 2017-10-01 MED ORDER — HYDROCHLOROTHIAZIDE 12.5 MG PO CAPS
12.5000 mg | ORAL_CAPSULE | Freq: Every day | ORAL | Status: DC
  Administered 2017-10-02 – 2017-10-04 (×3): 12.5 mg via ORAL
  Filled 2017-10-01 (×3): qty 1

## 2017-10-01 MED ORDER — HYDROCODONE-ACETAMINOPHEN 5-325 MG PO TABS
1.0000 | ORAL_TABLET | ORAL | Status: DC | PRN
Start: 2017-10-01 — End: 2017-10-04

## 2017-10-01 MED ORDER — LISINOPRIL-HYDROCHLOROTHIAZIDE 10-12.5 MG PO TABS: 1.0000 | ORAL_TABLET | Freq: Every day | ORAL | Status: DC

## 2017-10-01 MED ORDER — INSULIN ASPART 100 UNIT/ML ~~LOC~~ SOLN
0.0000 [IU] | Freq: Every day | SUBCUTANEOUS | Status: DC
  Administered 2017-10-02 – 2017-10-03 (×2): 2 [IU] via SUBCUTANEOUS
  Filled 2017-10-01 (×2): qty 1

## 2017-10-01 MED ORDER — ACETAMINOPHEN 325 MG PO TABS: 650.0000 mg | ORAL_TABLET | Freq: Four times a day (QID) | ORAL | Status: DC | PRN

## 2017-10-01 MED ORDER — ONDANSETRON HCL 4 MG PO TABS: 4.0000 mg | ORAL_TABLET | Freq: Four times a day (QID) | ORAL | Status: DC | PRN

## 2017-10-01 MED ORDER — ENOXAPARIN SODIUM 30 MG/0.3ML ~~LOC~~ SOLN: 30.0000 mg | SUBCUTANEOUS | Status: DC

## 2017-10-01 MED ORDER — INSULIN ASPART 100 UNIT/ML ~~LOC~~ SOLN
0.0000 [IU] | Freq: Three times a day (TID) | SUBCUTANEOUS | Status: DC
  Administered 2017-10-01: 8 [IU] via SUBCUTANEOUS
  Administered 2017-10-02 (×2): 3 [IU] via SUBCUTANEOUS
  Administered 2017-10-02: 5 [IU] via SUBCUTANEOUS
  Administered 2017-10-03: 2 [IU] via SUBCUTANEOUS
  Administered 2017-10-03 – 2017-10-04 (×4): 3 [IU] via SUBCUTANEOUS
  Filled 2017-10-01 (×8): qty 1

## 2017-10-01 MED ORDER — INSULIN ASPART PROT & ASPART (70-30 MIX) 100 UNIT/ML ~~LOC~~ SUSP: 50.0000 [IU] | Freq: Every day | SUBCUTANEOUS | Status: DC

## 2017-10-01 MED ORDER — FAMOTIDINE 20 MG PO TABS
20.0000 mg | ORAL_TABLET | Freq: Every day | ORAL | Status: DC
  Administered 2017-10-02 – 2017-10-04 (×3): 20 mg via ORAL
  Filled 2017-10-01 (×4): qty 1

## 2017-10-01 NOTE — Progress Notes (Signed)
Arrived to discuss PICC placement with patient including risks, benefits, and alternatives. Pt requested waiting until after his surgery tomorrow afternoon. Primary RN Lowella Bandy notified.

## 2017-10-01 NOTE — Consult Note (Signed)
[facility] Infectious Disease     Reason for Consult L foot osteomyelitis     Referring Physician: Gouru, A Date of Admission:  10/01/2017   Active Problems:   Osteomyelitis of ankle or foot, acute (HCC)   HPI: [name] is a 47 y.o. male with IDDM, HTN, HLD  admitted with worsening of L foot infection with Xray showing OM of L 5th MT. He has chronic ulceration of the L foot and follows with Dr Cleda Mccreedy as otpt.  On admit wbc 7, no fever.  He reports having a DM ulcer at the site for over a year but only worsened over last 7-10 days. No fevers, chills. Does not smoke.   Past Medical History:  Diagnosis Date  . Diabetes mellitus without complication ([place])   . Hypertension    Past Surgical History:  Procedure Laterality Date  . TOE AMPUTATION     Social History   Tobacco Use  . Smoking status: Never Smoker  . Smokeless tobacco: Never Used  Substance Use Topics  . Alcohol use: No  . Drug use: No   History reviewed. No pertinent family history.  Allergies: No Known Allergies  Current antibiotics: Antibiotics Given (last 72 hours)    Date/Time Action Medication Dose Rate   10/01/17 1352 New Bag/Given   piperacillin-tazobactam (ZOSYN) IVPB 3.375 g 3.375 g 12.5 mL/hr   10/01/17 1434 New Bag/Given   vancomycin (VANCOCIN) 2,000 mg in sodium chloride 0.9 % 500 mL IVPB 2,000 mg 250 mL/hr      MEDICATIONS: . famotidine  20 mg Oral Daily  . lisinopril  10 mg Oral Daily   And  . hydrochlorothiazide  12.5 mg Oral Daily  . insulin aspart  0-15 Units Subcutaneous TID WC  . insulin aspart  0-5 Units Subcutaneous QHS  . insulin aspart  5 Units Subcutaneous TID WC  . insulin aspart  7 Units Subcutaneous Once  . insulin glargine  40 Units Subcutaneous QHS  . PARoxetine  20 mg Oral Daily  . sodium chloride flush  3 mL Intravenous Q12H    Review of Systems - 11 systems reviewed and negative per HPI   OBJECTIVE: Temp:  [98.8 F (37.1 C)] 98.8 F (37.1 C) (01/08  1258) Pulse Rate:  [86] 86 (01/08 1258) Resp:  [20] 20 (01/08 1258) BP: (128)/(80) 128/80 (01/08 1258) SpO2:  [98 %] 98 % (01/08 1258) Weight:  [119.9 kg (264 lb 6.4 oz)] 119.9 kg (264 lb 6.4 oz) (01/08 1216) Physical Exam  Constitutional: He is oriented to person, place, and time. He appears well-developed and well-nourished. No distress. Obese  HENT: anicteric Mouth/Throat: Oropharynx is clear and moist. No oropharyngeal exudate.  Cardiovascular: Normal rate, regular rhythm and normal heart sounds. Exam reveals no gallop and no friction rub.  No murmur heard.  Pulmonary/Chest: Effort normal and breath sounds normal. No respiratory distress. He has no wheezes.  Abdominal: Soft. Bowel sounds are normal. He exhibits no distension. There is no tenderness.  Lymphadenopathy: He has no cervical adenopathy.  Neurological: He is alert and oriented to person, place, and time. Bil LE numbness Ext 2+ Bil LE edema Skin: L lateral foot over MT has 1 cm ulcer with exposed muscle - can express significant amt purulence.  Psychiatric: He has a normal mood and affect. His behavior is normal.     LABS: Results for orders placed or performed during the hospital encounter of 10/01/17 (from the past 48 hour(s))  CBC with Differential/Platelet  Status: Abnormal   Collection Time: 10/01/17 12:57 PM  Result Value Ref Range   WBC 7.5 3.8 - 10.6 K/uL   RBC 4.05 (L) 4.40 - 5.90 MIL/uL   Hemoglobin 12.1 (L) 13.0 - 18.0 g/dL   HCT 34.5 (L) 40.0 - 52.0 %   MCV 85.4 80.0 - 100.0 fL   MCH 29.8 26.0 - 34.0 pg   MCHC 34.9 32.0 - 36.0 g/dL   RDW 13.2 11.5 - 14.5 %   Platelets 285 150 - 440 K/uL   Neutrophils Relative % 72 %   Neutro Abs 5.4 1.4 - 6.5 K/uL   Lymphocytes Relative 18 %   Lymphs Abs 1.4 1.0 - 3.6 K/uL   Monocytes Relative 7 %   Monocytes Absolute 0.6 0.2 - 1.0 K/uL   Eosinophils Relative 2 %   Eosinophils Absolute 0.1 0 - 0.7 K/uL   Basophils Relative 1 %   Basophils Absolute 0.0 0 - 0.1  K/uL    Comment: Performed at [facility], [address] [place], [place] [address]  Basic metabolic panel     Status: Abnormal   Collection Time: 10/01/17 12:57 PM  Result Value Ref Range   Sodium 129 (L) 135 - 145 mmol/L   Potassium 4.4 3.5 - 5.1 mmol/L   Chloride 96 (L) 101 - 111 mmol/L   CO2 23 22 - 32 mmol/L   Glucose, Bld 331 (H) 65 - 99 mg/dL   BUN 24 (H) 6 - 20 mg/dL   Creatinine, Ser 1.23 0.61 - 1.24 mg/dL   Calcium 9.1 8.9 - 10.3 mg/dL   GFR calc non Af Amer >60 >60 mL/min   GFR calc Af Amer >60 >60 mL/min    Comment: (NOTE) The eGFR has been calculated using the CKD EPI equation. This calculation has not been validated in all clinical situations. eGFR's persistently <60 mL/min signify possible Chronic Kidney Disease.    Anion gap 10 5 - 15    Comment: Performed at [facility], [place], [place] [address]  Glucose, capillary     Status: Abnormal   Collection Time: 10/01/17 12:57 PM  Result Value Ref Range   Glucose-Capillary 306 (H) 65 - 99 mg/dL   No components found for: ESR, C REACTIVE PROTEIN MICRO: No results found for this or any previous visit (from the past 720 hour(s)).  IMAGING: 10/01/17 COMPARISON: None  FINDINGS: 3 views of the left foot reveal significant cortical destruction at the  fifth metatarsal head and base of the proximal phalanx of the fifth toe  consistent with acute osteomyelitis.  Assessment:   [name] is a 47 y.o. 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 He has chronic ulceration of the L foot and follows with Dr Cleda Mccreedy as otpt.  Currently I am able to epxress mod amount of pus from the site and have cultured it.  Planned amputation of L 5th toe and MT head on 1/9.  No otpt cultures available. Discussed with Dr Cleda Mccreedy and with patient need for IV abx  Recommendations Check ESR and CRP WIll need PICC  line and likely 4 weeks IV abx If no MRSA or Pseduomonas anticipate unasyn. Thank you very much for allowing me to participate in the care of this patient. Please call with questions.   Cheral Marker. Ola Spurr, MD

## 2017-10-01 NOTE — Consult Note (Signed)
Pharmacy Antibiotic Note  [name] is a 47 y.o. male admitted on 10/01/2017 with Osteomyelitis of left foot.  Pharmacy has been consulted for vancomycin and zosyn  dosing.  Plan: Ke: 0.084   T1/2: 8.25   Vd: 65   DW: 92kg   Will order Vancomycin 2g IV, followed by vancomycin 1500mg  IV every 12 hours with 8 hour stack dosing. Calculated trough at Css is 16.4. Trough level ordered prior to 4th dose. Will monitor renal function and adjust dose as needed.   Start Zosyn 3.375 IV EI every 8 hours.   Height: 5\' 10"  (177.8 cm) Weight: 264 lb 6.4 oz (119.9 kg) IBW/kg (Calculated) : 73  Temp (24hrs), Avg:98.8 F (37.1 C), Min:98.8 F (37.1 C), Max:98.8 F (37.1 C)  Recent Labs  Lab 10/01/17 1257  WBC 7.5  CREATININE 1.23    Estimated Creatinine Clearance: 96.4 mL/min (by C-G formula based on SCr of 1.23 mg/dL).    No Known Allergies  Antimicrobials this admission: 1/8 vancomycin  >>  1/8 zosyn  >>   Dose adjustments this admission:  Microbiology results:  Thank you for allowing pharmacy to be a part of this patient's care.  Gardner Candle, PharmD, BCPS Clinical Pharmacist 10/01/2017 1:49 PM

## 2017-10-01 NOTE — Progress Notes (Signed)
Inpatient Diabetes Program Recommendations  AACE/ADA: New Consensus Statement on Inpatient Glycemic Control (2015)  Target Ranges:  Prepandial:   less than 140 mg/dL      Peak postprandial:   less than 180 mg/dL (1-2 hours)      Critically ill patients:  140 - 180 mg/dL   Lab Results  Component Value Date   GLUCAP 215 (H) 09/03/2017    Review of Glycemic Control  Results for [name], [name] (MRN [id number]) as of 10/01/2017 13:47  Ref. Range 10/01/2017 12:57  Glucose-Capillary Latest Ref Range: 65 - 99 mg/dL 306 (H)    Diabetes history: Type 2, A1C pending Outpatient Diabetes medications: Novolin 70/30 35 units qam, Novolin 70/30 50 units at night- taking inconsistently  Current orders for Inpatient glycemic control: Novolog 70/30 35 units qam, Novolog 70/30 50 units qpm, Novolog 0-9 units tid, Novolog 0-5 units qhs  Inpatient Diabetes Program Recommendations:  Met with patient to discuss recent insulin practices at home- tells me he's been eating less and therefore he skips insulin in order to prevent low blood sugars.  Please consider discontinuing Novolog 70/30 am and pm (high risk hypoglycemia), and start Lantus 40 units qhs, Novolog 5 units tid with meals and, increase Novolog correction to moderate correction scale tid.  Continue Novolog 0-5 units qhs.  RN will give "correction" Novolog now and then this evening at supper time, which he will delay until 6pm.  Discussed plan with patient.  He confirms he did not take any insulin this am.   Text paged MD with recommendations.  Gentry Fitz, RN, BA, MHA, CDE Diabetes Coordinator Inpatient Diabetes Program  [phone] (Team Pager) [phone] ([place]) 10/01/2017 1:59 PM

## 2017-10-01 NOTE — Consult Note (Signed)
Reason for Consult: Osteomyelitis left foot Referring Physician: Murat Rideout is an 47 y.o. male.  HPI: This is a 47 year old male well-known to me outpatient with a chronic history of ulceration on his left foot.  Recently had significant increased swelling and redness.  Placed on antibiotics.  Recently developed significant pain and was seen outpatient and x-rays revealed osteomyelitis in the left fifth metatarsal and decision was made for admission and surgical debridement.  Past Medical History:  Diagnosis Date  . Diabetes mellitus without complication ([place])   . Hypertension     Past Surgical History:  Procedure Laterality Date  . TOE AMPUTATION      History reviewed. No pertinent family history.  Social History:  reports that  has never smoked. he has never used smokeless tobacco. He reports that he does not drink alcohol or use drugs.  Allergies: No Known Allergies  Medications:  Scheduled: . famotidine  20 mg Oral Daily  . lisinopril  10 mg Oral Daily   And  . hydrochlorothiazide  12.5 mg Oral Daily  . insulin aspart  0-15 Units Subcutaneous TID WC  . insulin aspart  0-5 Units Subcutaneous QHS  . insulin aspart  5 Units Subcutaneous TID WC  . insulin aspart  7 Units Subcutaneous Once  . insulin glargine  40 Units Subcutaneous QHS  . PARoxetine  20 mg Oral Daily  . sodium chloride flush  3 mL Intravenous Q12H    Results for orders placed or performed during the hospital encounter of 10/01/17 (from the past 48 hour(s))  CBC with Differential/Platelet     Status: Abnormal   Collection Time: 10/01/17 12:57 PM  Result Value Ref Range   WBC 7.5 3.8 - 10.6 K/uL   RBC 4.05 (L) 4.40 - 5.90 MIL/uL   Hemoglobin 12.1 (L) 13.0 - 18.0 g/dL   HCT 34.5 (L) 40.0 - 52.0 %   MCV 85.4 80.0 - 100.0 fL   MCH 29.8 26.0 - 34.0 pg   MCHC 34.9 32.0 - 36.0 g/dL   RDW 13.2 11.5 - 14.5 %   Platelets 285 150 - 440 K/uL   Neutrophils Relative % 72 %   Neutro Abs 5.4 1.4 - 6.5  K/uL   Lymphocytes Relative 18 %   Lymphs Abs 1.4 1.0 - 3.6 K/uL   Monocytes Relative 7 %   Monocytes Absolute 0.6 0.2 - 1.0 K/uL   Eosinophils Relative 2 %   Eosinophils Absolute 0.1 0 - 0.7 K/uL   Basophils Relative 1 %   Basophils Absolute 0.0 0 - 0.1 K/uL    Comment: Performed at [facility], [address] [place], [place] [address]  Basic metabolic panel     Status: Abnormal   Collection Time: 10/01/17 12:57 PM  Result Value Ref Range   Sodium 129 (L) 135 - 145 mmol/L   Potassium 4.4 3.5 - 5.1 mmol/L   Chloride 96 (L) 101 - 111 mmol/L   CO2 23 22 - 32 mmol/L   Glucose, Bld 331 (H) 65 - 99 mg/dL   BUN 24 (H) 6 - 20 mg/dL   Creatinine, Ser 1.23 0.61 - 1.24 mg/dL   Calcium 9.1 8.9 - 10.3 mg/dL   GFR calc non Af Amer >60 >60 mL/min   GFR calc Af Amer >60 >60 mL/min    Comment: (NOTE) The eGFR has been calculated using the CKD EPI equation. This calculation has not been validated in all clinical situations. eGFR's persistently <60 mL/min signify  possible Chronic Kidney Disease.    Anion gap 10 5 - 15    Comment: Performed at [facility], [address] [place], [place] [address]  Glucose, capillary     Status: Abnormal   Collection Time: 10/01/17 12:57 PM  Result Value Ref Range   Glucose-Capillary 306 (H) 65 - 99 mg/dL    No results found.  Review of Systems  Constitutional: Negative for chills and fever.  HENT: Negative.   Eyes: Negative.   Respiratory: Negative.   Cardiovascular: Negative.   Gastrointestinal: Negative for nausea and vomiting.  Genitourinary: Negative.   Musculoskeletal:       Pain in the left forefoot.  Skin:       Chronic draining ulceration beneath his left forefoot.  Neurological:       Patient does have numbness and neuropathy associated with his diabetes.  Endo/Heme/Allergies: Negative.   Psychiatric/Behavioral: Negative.    Blood pressure 128/80, pulse 86, temperature 98.8 F (37.1 C), temperature source Oral,  resp. rate 20, height 5' 10"  (1.778 m), weight 119.9 kg (264 lb 6.4 oz), SpO2 98 %. Physical Exam  Cardiovascular:  DP and PT pulses are palpable.  Musculoskeletal:  Previous amputation of the left fourth toe.  Fifth metatarsal on the left is relatively plantar displaced.  Adequate range of motion of the pedal joints.  Neurological:  Loss of protective threshold with a monofilament wire in the forefoot bilateral.  Skin:  Erythema and edema in the left forefoot around the fifth toe and fifth metatarsal area.  Full-thickness plantar ulceration with granulomatous tissue plantarly.    Assessment/Plan: Assessment: 1.  Osteomyelitis left fifth metatarsal. 2.  Diabetes with associated neuropathy.  Plan: Discussed with the patient the need for amputation of the fifth toe with debridement of the infected bone in the forefoot.  Discussed possible risks and complications of the procedure including inability to heal due to his diabetes or continued infection.  Discussed that there is the possibility of repeat debridement.  Questions invited and answered.  We will obtain a consent form for fifth ray resection of the left foot.  N.p.o. after breakfast tomorrow morning.  Plan for surgery tomorrow evening  Durward Fortes 10/01/2017, 3:27 PM

## 2017-10-01 NOTE — H&P (Signed)
[facility] - [place] [facility]   PATIENT NAME: [name]    MR#:  [id number]  DATE OF BIRTH:  09/11/1971  DATE OF ADMISSION:  10/01/2017  PRIMARY CARE PHYSICIAN: Polo Riley, MD   REQUESTING/REFERRING PHYSICIAN: Dr. Graciela Husbands  CHIEF COMPLAINT:  Left foot infection  HISTORY OF PRESENT ILLNESS:  [name]  is a 47 y.o. male with a known history of insulin requiring diabetes meds, hypertension, hyperlipidemia was seen by Dr. Graciela Husbands podiatrist today for nonhealing left foot wound.  X-ray has revealed osteomyelitis and patient is sent to the hospital as a direct admit from Dr. Odessa Fleming office.  Patient is started on broad-spectrum antibiotics at this point he denies any pain or fever.  Patient is resting comfortably denies any chest pain or shortness of breath  PAST MEDICAL HISTORY:   Past Medical History:  Diagnosis Date  . Diabetes mellitus without complication ([facility])   . Hypertension    Hyperlipidemia PAST SURGICAL HISTOIRY:   Past Surgical History:  Procedure Laterality Date  . TOE AMPUTATION      SOCIAL HISTORY:   Social History   Tobacco Use  . Smoking status: Never Smoker  . Smokeless tobacco: Never Used  Substance Use Topics  . Alcohol use: No    FAMILY HISTORY:  History reviewed. No pertinent family history.  DRUG ALLERGIES:  No Known Allergies  REVIEW OF SYSTEMS:  CONSTITUTIONAL: No fever, fatigue or weakness.  EYES: No blurred or double vision.  EARS, NOSE, AND THROAT: No tinnitus or ear pain.  RESPIRATORY: No cough, shortness of breath, wheezing or hemoptysis.  CARDIOVASCULAR: No chest pain, orthopnea, edema.  GASTROINTESTINAL: No nausea, vomiting, diarrhea or abdominal pain.  GENITOURINARY: No dysuria, hematuria.  ENDOCRINE: No polyuria, nocturia,  HEMATOLOGY: No anemia, easy bruising or bleeding SKIN: No rash or lesion. MUSCULOSKELETAL: Reporting left foot infection no joint pain or arthritis.   NEUROLOGIC: No  tingling, numbness, weakness.  PSYCHIATRY: No anxiety or depression.   MEDICATIONS AT HOME:   Prior to Admission medications   Medication Sig Start Date End Date Taking? Authorizing Provider  Continuous Blood Gluc Sensor (FREESTYLE LIBRE SENSOR SYSTEM) MISC U UTD Q 10 DAYS. 06/07/17   [provider]  HYDROcodone-acetaminophen (NORCO) 5-325 MG tablet Take 1 tablet by mouth every 4 (four) hours as needed for moderate pain. Patient not taking: Reported on 07/15/2017 12/09/16   Willy Eddy, MD  ibuprofen (ADVIL,MOTRIN) 200 MG tablet Take 200 mg by mouth every 6 (six) hours as needed.    [provider]  insulin NPH-regular Human (NOVOLIN 70/30) (70-30) 100 UNIT/ML injection Inject 35 units in the breakfast and 50 units at supper 03/06/17   [provider]  lisinopril-hydrochlorothiazide (PRINZIDE,ZESTORETIC) 10-12.5 MG tablet Take 1 tablet by mouth daily 03/06/17   [provider]  ondansetron (ZOFRAN) 4 MG tablet Take 1 tablet (4 mg total) by mouth every 8 (eight) hours as needed for nausea or vomiting. Patient not taking: Reported on 09/03/2017 07/13/17   Governor Rooks, MD  PARoxetine (PAXIL) 20 MG tablet Take 20 mg by mouth daily.  03/06/17   [provider]  promethazine (PHENERGAN) 25 MG suppository Place 1 suppository (25 mg total) rectally every 6 (six) hours as needed for nausea. Patient not taking: Reported on 09/03/2017 07/15/17 07/15/18  Minna Antis, MD  promethazine (PHENERGAN) 25 MG tablet Take 1 tablet (25 mg total) by mouth every 6 (six) hours as needed for nausea or vomiting. Patient not taking: Reported on 09/03/2017 07/15/17  Minna Antis, MD      VITAL SIGNS:  Blood pressure 128/80, pulse 86, temperature 98.8 F (37.1 C), temperature source Oral, resp. rate 20, height 5\' 10"  (1.778 m), weight 119.9 kg (264 lb 6.4 oz), SpO2 98 %.  PHYSICAL EXAMINATION:  GENERAL:  47 y.o.-year-old patient lying in the bed with no  acute distress.  EYES: Pupils equal, round, reactive to light and accommodation. No scleral icterus. Extraocular muscles intact.  HEENT: Head atraumatic, normocephalic. Oropharynx and nasopharynx clear.  NECK:  Supple, no jugular venous distention. No thyroid enlargement, no tenderness.  LUNGS: Normal breath sounds bilaterally, no wheezing, rales,rhonchi or crepitation. No use of accessory muscles of respiration.  CARDIOVASCULAR: S1, S2 normal. No murmurs, rubs, or gallops.  ABDOMEN: Soft, nontender, nondistended. Bowel sounds present. No organomegaly or mass.  EXTREMITIES: Status post amputation of the toe currently with infected left foot cleaned and dressed by podiatry prior to the hospital arrival  no pedal edema, cyanosis, or clubbing.  NEUROLOGIC: Cranial nerves II through XII are intact. Muscle strength 5/5 in all extremities. Sensation intact. Gait not checked.  PSYCHIATRIC: The patient is alert and oriented x 3.  SKIN: No obvious rash, lesion, or ulcer.   LABORATORY PANEL:   CBC No results for input(s): WBC, HGB, HCT, PLT in the last 168 hours. ------------------------------------------------------------------------------------------------------------------  Chemistries  No results for input(s): NA, K, CL, CO2, GLUCOSE, BUN, CREATININE, CALCIUM, MG, AST, ALT, ALKPHOS, BILITOT in the last 168 hours.  Invalid input(s): GFRCGP ------------------------------------------------------------------------------------------------------------------  Cardiac Enzymes No results for input(s): TROPONINI in the last 168 hours. ------------------------------------------------------------------------------------------------------------------  RADIOLOGY:  No results found.  EKG:   Orders placed or performed during the hospital encounter of 09/03/17  . ED EKG  . ED EKG  . EKG    IMPRESSION AND PLAN:  [name]  is a 47 y.o. male with a known history of insulin requiring diabetes meds,  hypertension, hyperlipidemia was seen by Dr. Graciela Husbands podiatrist today for nonhealing left foot wound.  X-ray has revealed osteomyelitis and patient is sent to the hospital as a direct admit from Dr. Odessa Fleming office.  Patient is started on broad-spectrum antibiotics at this point he denies any pain or fever.    #Left foot osteomyelitis Admit to MedSurg floor Zosyn and vancomycin IV antibiotics Podiatry and ID consult.  Discussed with Dr. Graciela Husbands Continue wound care Pain management as needed  #Insulin requiring diabetes mellitus Continue NovoLog 7030 35 units every morning and 50 units nightly Sliding scale insulin Diabetic coordinator consulted Check hemoglobin A1c   #essential hypertension Continue home medication lisinopril hydrochlorothiazide  # peripheral arterial disease Outpatient follow-up with vascular surgery as recommended   GI prophylaxis with Pepcid DVT prophylaxis with SCDs, as patient is going to get surgery in a.m. most likely   All the records are reviewed and case discussed with ED provider. Management plans discussed with the patient, family and they are in agreement.  CODE STATUS: fc   TOTAL TIME TAKING CARE OF THIS PATIENT: 45  minutes.   Note: This dictation was prepared with Dragon dictation along with smaller phrase technology. Any transcriptional errors that result from this process are unintentional.  Ramonita Lab M.D on 10/01/2017 at 1:01 PM  Between 7am to 6pm - Pager - [phone]  After 6pm go to [email or web address] - password [email or web address] [facility]  Office  [phone]  CC: Primary care physician; Polo Riley, MD

## 2017-10-01 NOTE — H&P (View-Only) (Signed)
Reason for Consult: Osteomyelitis left foot Referring Physician: Jayln Madeira is an 47 y.o. male.  HPI: This is a 47 year old male well-known to me outpatient with a chronic history of ulceration on his left foot.  Recently had significant increased swelling and redness.  Placed on antibiotics.  Recently developed significant pain and was seen outpatient and x-rays revealed osteomyelitis in the left fifth metatarsal and decision was made for admission and surgical debridement.  Past Medical History:  Diagnosis Date  . Diabetes mellitus without complication ([place])   . Hypertension     Past Surgical History:  Procedure Laterality Date  . TOE AMPUTATION      History reviewed. No pertinent family history.  Social History:  reports that  has never smoked. he has never used smokeless tobacco. He reports that he does not drink alcohol or use drugs.  Allergies: No Known Allergies  Medications:  Scheduled: . famotidine  20 mg Oral Daily  . lisinopril  10 mg Oral Daily   And  . hydrochlorothiazide  12.5 mg Oral Daily  . insulin aspart  0-15 Units Subcutaneous TID WC  . insulin aspart  0-5 Units Subcutaneous QHS  . insulin aspart  5 Units Subcutaneous TID WC  . insulin aspart  7 Units Subcutaneous Once  . insulin glargine  40 Units Subcutaneous QHS  . PARoxetine  20 mg Oral Daily  . sodium chloride flush  3 mL Intravenous Q12H    Results for orders placed or performed during the hospital encounter of 10/01/17 (from the past 48 hour(s))  CBC with Differential/Platelet     Status: Abnormal   Collection Time: 10/01/17 12:57 PM  Result Value Ref Range   WBC 7.5 3.8 - 10.6 K/uL   RBC 4.05 (L) 4.40 - 5.90 MIL/uL   Hemoglobin 12.1 (L) 13.0 - 18.0 g/dL   HCT 34.5 (L) 40.0 - 52.0 %   MCV 85.4 80.0 - 100.0 fL   MCH 29.8 26.0 - 34.0 pg   MCHC 34.9 32.0 - 36.0 g/dL   RDW 13.2 11.5 - 14.5 %   Platelets 285 150 - 440 K/uL   Neutrophils Relative % 72 %   Neutro Abs 5.4 1.4 - 6.5  K/uL   Lymphocytes Relative 18 %   Lymphs Abs 1.4 1.0 - 3.6 K/uL   Monocytes Relative 7 %   Monocytes Absolute 0.6 0.2 - 1.0 K/uL   Eosinophils Relative 2 %   Eosinophils Absolute 0.1 0 - 0.7 K/uL   Basophils Relative 1 %   Basophils Absolute 0.0 0 - 0.1 K/uL    Comment: Performed at [facility], [address] [place],  [address]  Basic metabolic panel     Status: Abnormal   Collection Time: 10/01/17 12:57 PM  Result Value Ref Range   Sodium 129 (L) 135 - 145 mmol/L   Potassium 4.4 3.5 - 5.1 mmol/L   Chloride 96 (L) 101 - 111 mmol/L   CO2 23 22 - 32 mmol/L   Glucose, Bld 331 (H) 65 - 99 mg/dL   BUN 24 (H) 6 - 20 mg/dL   Creatinine, Ser 1.23 0.61 - 1.24 mg/dL   Calcium 9.1 8.9 - 10.3 mg/dL   GFR calc non Af Amer >60 >60 mL/min   GFR calc Af Amer >60 >60 mL/min    Comment: (NOTE) The eGFR has been calculated using the CKD EPI equation. This calculation has not been validated in all clinical situations. eGFR's persistently <60 mL/min signify  possible Chronic Kidney Disease.    Anion gap 10 5 - 15    Comment: Performed at [facility], [address] [place], [place] [address]  Glucose, capillary     Status: Abnormal   Collection Time: 10/01/17 12:57 PM  Result Value Ref Range   Glucose-Capillary 306 (H) 65 - 99 mg/dL    No results found.  Review of Systems  Constitutional: Negative for chills and fever.  HENT: Negative.   Eyes: Negative.   Respiratory: Negative.   Cardiovascular: Negative.   Gastrointestinal: Negative for nausea and vomiting.  Genitourinary: Negative.   Musculoskeletal:       Pain in the left forefoot.  Skin:       Chronic draining ulceration beneath his left forefoot.  Neurological:       Patient does have numbness and neuropathy associated with his diabetes.  Endo/Heme/Allergies: Negative.   Psychiatric/Behavioral: Negative.    Blood pressure 128/80, pulse 86, temperature 98.8 F (37.1 C), temperature source Oral,  resp. rate 20, height 5' 10"  (1.778 m), weight 119.9 kg (264 lb 6.4 oz), SpO2 98 %. Physical Exam  Cardiovascular:  DP and PT pulses are palpable.  Musculoskeletal:  Previous amputation of the left fourth toe.  Fifth metatarsal on the left is relatively plantar displaced.  Adequate range of motion of the pedal joints.  Neurological:  Loss of protective threshold with a monofilament wire in the forefoot bilateral.  Skin:  Erythema and edema in the left forefoot around the fifth toe and fifth metatarsal area.  Full-thickness plantar ulceration with granulomatous tissue plantarly.    Assessment/Plan: Assessment: 1.  Osteomyelitis left fifth metatarsal. 2.  Diabetes with associated neuropathy.  Plan: Discussed with the patient the need for amputation of the fifth toe with debridement of the infected bone in the forefoot.  Discussed possible risks and complications of the procedure including inability to heal due to his diabetes or continued infection.  Discussed that there is the possibility of repeat debridement.  Questions invited and answered.  We will obtain a consent form for fifth ray resection of the left foot.  N.p.o. after breakfast tomorrow morning.  Plan for surgery tomorrow evening  Durward Fortes 10/01/2017, 3:27 PM

## 2017-10-02 ENCOUNTER — Encounter: Payer: Self-pay | Admitting: Anesthesiology

## 2017-10-02 ENCOUNTER — Inpatient Hospital Stay: Payer: BLUE CROSS/BLUE SHIELD | Admitting: Anesthesiology

## 2017-10-02 ENCOUNTER — Encounter
Admission: AD | Disposition: A | Discharge status: Home Health | Payer: Self-pay | Source: Ambulatory Visit | Attending: Internal Medicine

## 2017-10-02 HISTORY — PX: AMPUTATION: SHX166

## 2017-10-02 LAB — CBC
HEMATOCRIT: 35 % — AB (ref 40.0–52.0)
Hemoglobin: 12 g/dL — ABNORMAL LOW (ref 13.0–18.0)
MCH: 29.2 pg (ref 26.0–34.0)
MCHC: 34.1 g/dL (ref 32.0–36.0)
MCV: 85.6 fL (ref 80.0–100.0)
Platelets: 306 10*3/uL (ref 150–440)
RBC: 4.09 MIL/uL — ABNORMAL LOW (ref 4.40–5.90)
RDW: 12.9 % (ref 11.5–14.5)
WBC: 7.5 10*3/uL (ref 3.8–10.6)

## 2017-10-02 LAB — GLUCOSE, CAPILLARY
GLUCOSE-CAPILLARY: 200 mg/dL — AB (ref 65–99)
GLUCOSE-CAPILLARY: 143 mg/dL — AB (ref 65–99)
Glucose-Capillary: 236 mg/dL — ABNORMAL HIGH (ref 65–99)
Glucose-Capillary: 162 mg/dL — ABNORMAL HIGH (ref 65–99)
Glucose-Capillary: 215 mg/dL — ABNORMAL HIGH (ref 65–99)

## 2017-10-02 LAB — HEMOGLOBIN A1C
Hgb A1c MFr Bld: 9.1 % — ABNORMAL HIGH (ref 4.8–5.6)
Mean Plasma Glucose: 214.47 mg/dL

## 2017-10-02 LAB — MRSA PCR SCREENING: MRSA by PCR: NEGATIVE

## 2017-10-02 LAB — HIV ANTIBODY (ROUTINE TESTING W REFLEX): HIV Screen 4th Generation wRfx: NONREACTIVE

## 2017-10-02 LAB — C-REACTIVE PROTEIN: CRP: 5.4 mg/dL — AB (ref <1.0)

## 2017-10-02 LAB — SEDIMENTATION RATE: SED RATE: 103 mm/h — AB (ref 0–15)

## 2017-10-02 SURGERY — AMPUTATION, FOOT, RAY
Anesthesia: General | Site: Fifth Toe | Laterality: Left | Wound class: Dirty or Infected

## 2017-10-02 MED ORDER — ONDANSETRON HCL 4 MG/2ML IJ SOLN
INTRAMUSCULAR | Status: AC
  Filled 2017-10-02: qty 2

## 2017-10-02 MED ORDER — INSULIN GLARGINE 100 UNIT/ML ~~LOC~~ SOLN
42.0000 [IU] | Freq: Every day | SUBCUTANEOUS | Status: DC
  Administered 2017-10-02: 42 [IU] via SUBCUTANEOUS
  Filled 2017-10-02 (×2): qty 0.42

## 2017-10-02 MED ORDER — BUPIVACAINE HCL 0.5 % IJ SOLN
INTRAMUSCULAR | Status: DC | PRN
  Administered 2017-10-02: 10 mL

## 2017-10-02 MED ORDER — ADULT MULTIVITAMIN W/MINERALS CH
1.0000 | ORAL_TABLET | Freq: Every day | ORAL | Status: DC
  Administered 2017-10-03 – 2017-10-04 (×2): 1 via ORAL
  Filled 2017-10-02 (×2): qty 1

## 2017-10-02 MED ORDER — ONDANSETRON HCL 4 MG/2ML IJ SOLN
INTRAMUSCULAR | Status: DC | PRN
  Administered 2017-10-02: 4 mg via INTRAVENOUS

## 2017-10-02 MED ORDER — FENTANYL CITRATE (PF) 100 MCG/2ML IJ SOLN
INTRAMUSCULAR | Status: AC
  Filled 2017-10-02: qty 2

## 2017-10-02 MED ORDER — SODIUM CHLORIDE 0.9 % IV SOLN
INTRAVENOUS | Status: DC | PRN
  Administered 2017-10-02: 18:00:00 via INTRAVENOUS

## 2017-10-02 MED ORDER — GLYCOPYRROLATE 0.2 MG/ML IJ SOLN
INTRAMUSCULAR | Status: AC
  Filled 2017-10-02: qty 1

## 2017-10-02 MED ORDER — LIDOCAINE HCL (PF) 2 % IJ SOLN
INTRAMUSCULAR | Status: AC
  Filled 2017-10-02: qty 10

## 2017-10-02 MED ORDER — MIDAZOLAM HCL 5 MG/5ML IJ SOLN
INTRAMUSCULAR | Status: DC | PRN
  Administered 2017-10-02: 2 mg via INTRAVENOUS

## 2017-10-02 MED ORDER — PROPOFOL 10 MG/ML IV BOLUS
INTRAVENOUS | Status: AC
  Filled 2017-10-02: qty 20

## 2017-10-02 MED ORDER — PREMIER PROTEIN SHAKE
11.0000 [oz_av] | Freq: Two times a day (BID) | ORAL | Status: DC
  Administered 2017-10-04 (×2): 11 [oz_av] via ORAL

## 2017-10-02 MED ORDER — FENTANYL CITRATE (PF) 100 MCG/2ML IJ SOLN
INTRAMUSCULAR | Status: DC | PRN
  Administered 2017-10-02 (×4): 50 ug via INTRAVENOUS

## 2017-10-02 MED ORDER — GLYCOPYRROLATE 0.2 MG/ML IJ SOLN
INTRAMUSCULAR | Status: DC | PRN
  Administered 2017-10-02: 0.2 mg via INTRAVENOUS

## 2017-10-02 MED ORDER — OXYCODONE-ACETAMINOPHEN 5-325 MG PO TABS: 1.0000 | ORAL_TABLET | ORAL | Status: DC | PRN

## 2017-10-02 MED ORDER — FENTANYL CITRATE (PF) 100 MCG/2ML IJ SOLN: 25.0000 ug | INTRAMUSCULAR | Status: DC | PRN

## 2017-10-02 MED ORDER — PROPOFOL 10 MG/ML IV BOLUS
INTRAVENOUS | Status: DC | PRN
  Administered 2017-10-02: 200 mg via INTRAVENOUS

## 2017-10-02 MED ORDER — LIDOCAINE HCL (PF) 2 % IJ SOLN
INTRAMUSCULAR | Status: DC | PRN
  Administered 2017-10-02: 50 mg

## 2017-10-02 MED ORDER — PHENYLEPHRINE HCL 10 MG/ML IJ SOLN
INTRAMUSCULAR | Status: DC | PRN
  Administered 2017-10-02: 100 ug via INTRAVENOUS

## 2017-10-02 MED ORDER — MORPHINE SULFATE (PF) 2 MG/ML IV SOLN: 2.0000 mg | INTRAVENOUS | Status: DC | PRN

## 2017-10-02 MED ORDER — SODIUM CHLORIDE 0.9% FLUSH: 3.0000 mL | INTRAVENOUS | Status: DC | PRN

## 2017-10-02 MED ORDER — MIDAZOLAM HCL 2 MG/2ML IJ SOLN
INTRAMUSCULAR | Status: AC
  Filled 2017-10-02: qty 2

## 2017-10-02 MED ORDER — PROMETHAZINE HCL 25 MG/ML IJ SOLN: 6.2500 mg | INTRAMUSCULAR | Status: DC | PRN

## 2017-10-02 SURGICAL SUPPLY — 55 items
BANDAGE ACE 4X5 VEL STRL LF (GAUZE/BANDAGES/DRESSINGS) ×3 IMPLANT
BLADE MED AGGRESSIVE (BLADE) ×1 IMPLANT
BLADE OSC/SAGITTAL MD 5.5X18 (BLADE) ×3 IMPLANT
BLADE SURG 15 STRL LF DISP TIS (BLADE) ×2 IMPLANT
BLADE SURG 15 STRL SS (BLADE) ×6
BLADE SURG MINI STRL (BLADE) ×1 IMPLANT
BNDG CMPR 75X21 PLY HI ABS (MISCELLANEOUS) ×1
BNDG ESMARK 4X12 TAN STRL LF (GAUZE/BANDAGES/DRESSINGS) ×3 IMPLANT
BNDG GAUZE 4.5X4.1 6PLY STRL (MISCELLANEOUS) ×7 IMPLANT
CANISTER SUCT 1200ML W/VALVE (MISCELLANEOUS) ×3 IMPLANT
CLOSURE WOUND 1/4X4 (GAUZE/BANDAGES/DRESSINGS) ×1
CNTNR SPEC 2.5X3XGRAD LEK (MISCELLANEOUS) ×1
CONT SPEC 4OZ STER OR WHT (MISCELLANEOUS) ×2
CONT SPEC 4OZ STRL OR WHT (MISCELLANEOUS) ×1
CONTAINER SPEC 2.5X3XGRAD LEK (MISCELLANEOUS) IMPLANT
CUFF TOURN 18 STER (MISCELLANEOUS) ×3 IMPLANT
CUFF TOURN DUAL PL 12 NO SLV (MISCELLANEOUS) ×1 IMPLANT
DRAPE FLUOR MINI C-ARM 54X84 (DRAPES) ×1 IMPLANT
DURAPREP 26ML APPLICATOR (WOUND CARE) ×3 IMPLANT
ELECT REM PT RETURN 9FT ADLT (ELECTROSURGICAL) ×3
ELECTRODE REM PT RTRN 9FT ADLT (ELECTROSURGICAL) ×1 IMPLANT
GAUZE PETRO XEROFOAM 1X8 (MISCELLANEOUS) ×3 IMPLANT
GAUZE SPONGE 4X4 12PLY STRL (GAUZE/BANDAGES/DRESSINGS) ×3 IMPLANT
GAUZE STRETCH 2X75IN STRL (MISCELLANEOUS) ×3 IMPLANT
GLOVE BIO SURGEON STRL SZ7.5 (GLOVE) ×3 IMPLANT
GLOVE INDICATOR 8.0 STRL GRN (GLOVE) ×3 IMPLANT
GOWN STRL REUS W/ TWL LRG LVL3 (GOWN DISPOSABLE) ×2 IMPLANT
GOWN STRL REUS W/TWL LRG LVL3 (GOWN DISPOSABLE) ×6
HANDLE YANKAUER SUCT BULB TIP (MISCELLANEOUS) ×2 IMPLANT
HANDPIECE VERSAJET DEBRIDEMENT (MISCELLANEOUS) ×5 IMPLANT
KIT RM TURNOVER STRD PROC AR (KITS) ×3 IMPLANT
KIT STIMULAN RAPID CURE 5CC (Orthopedic Implant) ×2 IMPLANT
LABEL OR SOLS (LABEL) ×3 IMPLANT
NDL FILTER BLUNT 18X1 1/2 (NEEDLE) ×1 IMPLANT
NDL HYPO 25X1 1.5 SAFETY (NEEDLE) ×2 IMPLANT
NEEDLE FILTER BLUNT 18X 1/2SAF (NEEDLE) ×2
NEEDLE FILTER BLUNT 18X1 1/2 (NEEDLE) ×1 IMPLANT
NEEDLE HYPO 25X1 1.5 SAFETY (NEEDLE) ×6 IMPLANT
NS IRRIG 500ML POUR BTL (IV SOLUTION) ×3 IMPLANT
PACK EXTREMITY ARMC (MISCELLANEOUS) ×3 IMPLANT
PAD ABD DERMACEA PRESS 5X9 (GAUZE/BANDAGES/DRESSINGS) ×4 IMPLANT
SOL .9 NS 3000ML IRR  AL (IV SOLUTION) ×2
SOL .9 NS 3000ML IRR AL (IV SOLUTION) ×1
SOL .9 NS 3000ML IRR UROMATIC (IV SOLUTION) ×1 IMPLANT
SOL PREP PVP 2OZ (MISCELLANEOUS) ×3
SOLUTION PREP PVP 2OZ (MISCELLANEOUS) ×1 IMPLANT
STOCKINETTE STRL 6IN 960660 (GAUZE/BANDAGES/DRESSINGS) ×3 IMPLANT
STRIP CLOSURE SKIN 1/4X4 (GAUZE/BANDAGES/DRESSINGS) ×2 IMPLANT
SUT ETHILON 3-0 FS-10 30 BLK (SUTURE) ×3
SUT ETHILON 4-0 (SUTURE)
SUT ETHILON 4-0 FS2 18XMFL BLK (SUTURE)
SUTURE EHLN 3-0 FS-10 30 BLK (SUTURE) ×1 IMPLANT
SUTURE ETHLN 4-0 FS2 18XMF BLK (SUTURE) IMPLANT
SWAB DUAL CULTURE TRANS RED ST (MISCELLANEOUS) ×1 IMPLANT
SYR 10ML LL (SYRINGE) ×3 IMPLANT

## 2017-10-02 NOTE — Op Note (Signed)
Date of operation: 10/02/2017.  Surgeon: Ricci Barker D.P.M.  Preoperative diagnosis: Osteomyelitis left fifth metatarsal phalangeal joint.  Postoperative diagnosis: Same.  Procedure: Left fifth ray resection.  Anesthesia: LMA with local.  Hemostasis: Pneumatic tourniquet left ankle 250 mmHg.  Estimated blood loss: Less than 5 cc.  Pathology: Left fifth ray.  Cultures: Bone culture fifth metatarsal.  Implants: Stimulan rapid cure antibiotic beads impregnated with vancomycin.  Complications: None apparent.  Operative indications: This is a 47 year old male with a history of a chronic ulceration on his left foot which recently extended down to the bone with radiographic osteomyelitis and was admitted for IV antibiotics and surgical debridement.  Operative procedure: Patient was taken to the operating room and placed on the table in the supine position.  Following satisfactory LMA anesthesia the left foot was anesthetized with 10 cc of 0.5% Marcaine plain around the fifth metatarsal.  The left foot was prepped and draped in the usual sterile fashion.  Foot was exsanguinated and the tourniquet inflated to 250 mmHg.     Attention was then directed to the distal aspect of the left foot where an approximate 4 cm linear dorsal incision was placed over the fifth metatarsal and then carried around the medial and lateral base of the fifth toe.  The incision was deepened sharply down to the level of the bone and dissection carried back proximally to the fifth metatarsal shaft.  The fifth metatarsal was incised using a sagittal saw and the distal fifth metatarsal and fifth toe was removed.  Significant fragmentation of the metatarsal head.  Sample of bone was taken for a bone culture.  The surrounding soft tissues were then debrided with a versa jet debrider on a setting of 3 to remove all devitalized tissue.  Wound was then thoroughly irrigated with the versa jet debrider on a setting of 2 and then with  copious amounts of sterile saline with a bulb syringe.  Stimulan rapid your antibiotic beads impregnated with vancomycin was then placed into the wound and then the incision was closed using 3-0 nylon vertical mattress and simple interrupted sutures with a horizontal mattress suture through the plantar ulceration.  Xeroform 4 x 4's ABD Kerlix were then applied and the tourniquet was released and blood flow noted return immediately to the left foot.  Another Kerlix and Ace wrap were then applied for compression.  Patient tolerated the procedure and anesthesia well and was transported to the PACU with vital signs stable and in good condition.

## 2017-10-02 NOTE — Progress Notes (Signed)
Supposed to have a spinal tap to evaluate for multiple sclerosis tomorrow wondering if he can have while he is here, orders placed for fluoroscopy guided LP.  If possible patient and family wanted to have LP here.  Patient had abnormal MRI, followed by Dr.  Malvin Johns neurology.

## 2017-10-02 NOTE — Interval H&P Note (Signed)
History and Physical Interval Note:  10/02/2017 5:42 PM  [name]  has presented today for surgery, with the diagnosis of N/A  The various methods of treatment have been discussed with the patient and family. After consideration of risks, benefits and other options for treatment, the patient has consented to  Procedure(s): AMPUTATION RAY-LEFT 5TH (Left) as a surgical intervention .  The patient's history has been reviewed, patient examined, no change in status, stable for surgery.  I have reviewed the patient's chart and labs.  Questions were answered to the patient's satisfaction.     Ricci Barker

## 2017-10-02 NOTE — Anesthesia Preprocedure Evaluation (Signed)
Anesthesia Evaluation  Patient identified by MRN, date of birth, ID band Patient awake    Reviewed: Allergy & Precautions, H&P , NPO status , Patient's Chart, lab work & pertinent test results, reviewed documented beta blocker date and time   Airway Mallampati: III  TM Distance: >3 FB Neck ROM: full    Dental  (+) Dental Advidsory Given, Teeth Intact   Pulmonary neg pulmonary ROS,           Cardiovascular Exercise Tolerance: Good hypertension, (-) angina(-) CAD, (-) Past MI, (-) Cardiac Stents and (-) CABG (-) dysrhythmias (-) Valvular Problems/Murmurs     Neuro/Psych negative neurological ROS  negative psych ROS   GI/Hepatic negative GI ROS, Neg liver ROS,   Endo/Other  diabetes, Insulin Dependent  Renal/GU CRF  negative genitourinary   Musculoskeletal   Abdominal   Peds  Hematology negative hematology ROS (+)   Anesthesia Other Findings Past Medical History: No date: Diabetes mellitus without complication (HCC) No date: Hypertension   Reproductive/Obstetrics negative OB ROS                             Anesthesia Physical Anesthesia Plan  ASA: III  Anesthesia Plan: General   Post-op Pain Management:    Induction: Intravenous  PONV Risk Score and Plan: 2 and Ondansetron and Dexamethasone  Airway Management Planned: LMA  Additional Equipment:   Intra-op Plan:   Post-operative Plan: Extubation in OR  Informed Consent: I have reviewed the patients History and Physical, chart, labs and discussed the procedure including the risks, benefits and alternatives for the proposed anesthesia with the patient or authorized representative who has indicated his/her understanding and acceptance.   Dental Advisory Given  Plan Discussed with: Anesthesiologist, CRNA and Surgeon  Anesthesia Plan Comments:         Anesthesia Quick Evaluation

## 2017-10-02 NOTE — Anesthesia Procedure Notes (Signed)
Procedure Name: LMA Insertion Performed by: Lochlann Mastrangelo, CRNA Pre-anesthesia Checklist: Patient identified, Patient being monitored, Timeout performed, Emergency Drugs available and Suction available Patient Re-evaluated:Patient Re-evaluated prior to induction Oxygen Delivery Method: Circle system utilized Preoxygenation: Pre-oxygenation with 100% oxygen Induction Type: IV induction Ventilation: Mask ventilation without difficulty LMA: LMA inserted LMA Size: 4.0 Tube type: Oral Number of attempts: 1 Placement Confirmation: positive ETCO2 and breath sounds checked- equal and bilateral Tube secured with: Tape Dental Injury: Teeth and Oropharynx as per pre-operative assessment        

## 2017-10-02 NOTE — Progress Notes (Signed)
[facility] INFECTIOUS [facility] PROGRESS NOTE Date of Admission:  10/01/2017     ID: [name] is a 47 y.o. male with  DM foot osteomyelitis Active Problems:   Osteomyelitis of ankle or foot, acute (HCC)   Subjective: No fevers, for surg today.  ROS  Eleven systems are reviewed and negative except per hpi  Medications:  Antibiotics Given (last 72 hours)    Date/Time Action Medication Dose Rate   10/01/17 1352 New Bag/Given   piperacillin-tazobactam (ZOSYN) IVPB 3.375 g 3.375 g 12.5 mL/hr   10/01/17 1434 New Bag/Given   vancomycin (VANCOCIN) 2,000 mg in sodium chloride 0.9 % 500 mL IVPB 2,000 mg 250 mL/hr   10/01/17 2148 New Bag/Given   piperacillin-tazobactam (ZOSYN) IVPB 3.375 g 3.375 g 12.5 mL/hr   10/02/17 0149 New Bag/Given   vancomycin (VANCOCIN) 1,500 mg in sodium chloride 0.9 % 500 mL IVPB 1,500 mg 250 mL/hr   10/02/17 0514 New Bag/Given   piperacillin-tazobactam (ZOSYN) IVPB 3.375 g 3.375 g 12.5 mL/hr   10/02/17 1300 New Bag/Given   vancomycin (VANCOCIN) 1,500 mg in sodium chloride 0.9 % 500 mL IVPB 1,500 mg 250 mL/hr     . famotidine  20 mg Oral Daily  . lisinopril  10 mg Oral Daily   And  . hydrochlorothiazide  12.5 mg Oral Daily  . insulin aspart  0-15 Units Subcutaneous TID WC  . insulin aspart  0-5 Units Subcutaneous QHS  . insulin aspart  5 Units Subcutaneous TID WC  . insulin aspart  7 Units Subcutaneous Once  . insulin glargine  42 Units Subcutaneous QHS  . [START ON 10/03/2017] multivitamin with minerals  1 tablet Oral Daily  . PARoxetine  20 mg Oral Daily  . [START ON 10/03/2017] protein supplement shake  11 oz Oral BID BM  . sodium chloride flush  3 mL Intravenous Q12H    Objective: Vital signs in last 24 hours: Temp:  [97.9 F (36.6 C)-98.5 F (36.9 C)] 97.9 F (36.6 C) (01/09 0730) Pulse Rate:  [69-82] 69 (01/09 0730) Resp:  [18-20] 18 (01/09 0730) BP: (122-132)/(70-80) 132/80 (01/09 0730) SpO2:  [99 %-100 %] 99 % (01/09  0730) Constitutional: He is oriented to person, place, and time. He appears well-developed and well-nourished. No distress. Obese  HENT: anicteric Mouth/Throat: Oropharynx is clear and moist. No oropharyngeal exudate.  Cardiovascular: Normal rate, regular rhythm and normal heart sounds. Exam reveals no gallop and no friction rub.  No murmur heard.  Pulmonary/Chest: Effort normal and breath sounds normal. No respiratory distress. He has no wheezes.  Abdominal: Soft. Bowel sounds are normal. He exhibits no distension. There is no tenderness.  Lymphadenopathy: He has no cervical adenopathy.  Neurological: He is alert and oriented to person, place, and time. Bil LE numbness Ext 2+ Bil LE edema Skin: L lateral foot over MT has 1 cm ulcer with exposed muscle - can express significant amt purulence.  Psychiatric: He has a normal mood and affect. His behavior is normal.     Lab Results Recent Labs    10/01/17 1257 10/02/17 0520  WBC 7.5 7.5  HGB 12.1* 12.0*  HCT 34.5* 35.0*  NA 129*  --   K 4.4  --   CL 96*  --   CO2 23  --   BUN 24*  --   CREATININE 1.23  --    Lab Results  Component Value Date   CRP 5.4 (H) 10/02/2017   Lab Results  Component Value Date  ESRSEDRATE 103 (H) 10/02/2017     Microbiology: Results for orders placed or performed during the hospital encounter of 10/01/17  Aerobic Culture (superficial specimen)     Status: None (Preliminary result)   Collection Time: 10/01/17  4:24 PM  Result Value Ref Range Status   Specimen Description   Final    FOOT LEFT Performed at [facility], [address], [place], [place] [address]    Special Requests   Final    Normal Performed at [facility], [address] [place], [place] [address]    Gram Stain   Final    RARE WBC PRESENT, PREDOMINANTLY PMN RARE GRAM POSITIVE COCCI IN CLUSTERS Performed at [facility], [place] [address] [place], [place] [address]    Culture PENDING   Incomplete   Report Status PENDING  Incomplete    Studies/Results: No results found.  Assessment/Plan: IMAGING: 10/01/17 COMPARISON: None  FINDINGS: 3 views of the left foot reveal significant cortical destruction at the  fifth metatarsal head and base of the proximal phalanx of the fifth toe  consistent with acute osteomyelitis.  Assessment:   [name] is a 47 y.o. 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 He has chronic ulceration of the L foot and follows with Dr Cleda Mccreedy as otpt.  Currently I am able to epxress mod amount of pus from the site and have cultured it.  Planned amputation of L 5th toe and MT head on 1/9.  No otpt cultures available. Gram stain here with GPC in clusters- suspect it will be staph aureus Discussed with Dr Cleda Mccreedy and with patient need for IV abx.  ESR 102, CRP 5.  Recommendations For surgery today.  WIll need PICC line and likely 4 weeks IV abx If no MRSA or Pseduomonas anticipate unasyn.  Thank you very much for the consult. Will follow with you.  Leonel Ramsay   10/02/2017, 3:15 PM

## 2017-10-02 NOTE — Progress Notes (Signed)
Initial Nutrition Assessment  DOCUMENTATION CODES:   Obesity unspecified  INTERVENTION:  Provide Premier Protein BID with diet advancement, each supplement provides 160 kcal, 30 grams of protein.  Provide MVI daily.  NUTRITION DIAGNOSIS:   Increased nutrient needs related to wound healing as evidenced by estimated needs.  GOAL:   Patient will meet greater than or equal to 90% of their needs  MONITOR:   PO intake, Supplement acceptance, Labs, Weight trends, Skin, I & O's  REASON FOR ASSESSMENT:   Malnutrition Screening Tool    ASSESSMENT:   47 year old male with PMHx of DM type 2, HTN, who is now admitted with osteomyelitis of nonhealing left foot wound.   -Patient currently NPO for operation later today.  Met with patient at bedside. He reports he has not been eating well for a few months now. He is unsure what is causing his decreased appetite. He denies any N/V, abdominal pain, constipation/diarrhea, difficulty chewing/swallowing. He reports all food he used to enjoy does not taste good now. He may eat 2-3 small meals each day, such as salad per his report. He reports he has had the wound on his foot for 8 years now and the top layer of skin would not heal up and then got infected recently.   UBW 308 lbs. Per chart he may have been in the low 300 lbs range in early 06/2017, but difficult to tell as weights do not appear to be true measured weights. He is currently 264.4 lbs. That would be a weight loss of at least 35.6 lbs (11.9% body weight) over 3 months, which would be significant for time frame. Unsure of accuracy of this as patient's nutrition-focused physical exam does not show any muscle or fat wasting, which would be expected with significant weight loss. Patient reports he occasionally gets significant leg edema, so that could have contributed to the weight loss.   Meal Completion: 100%  Medications reviewed and include: famotidine, Novolog 0-15 units TID, Novolog  0-5 units QHS, Novolog 5 units TID, Lantus 40 units QHS, Zosyn, vancomycin.  Labs reviewed: CBG 162-306, HgbA1c 9.1.  NUTRITION - FOCUSED PHYSICAL EXAM:    Most Recent Value  Orbital Region  No depletion  Upper Arm Region  No depletion  Thoracic and Lumbar Region  No depletion  Buccal Region  No depletion  Temple Region  No depletion  Clavicle Bone Region  No depletion  Clavicle and Acromion Bone Region  No depletion  Scapular Bone Region  No depletion  Dorsal Hand  No depletion  Patellar Region  No depletion  Anterior Thigh Region  No depletion  Posterior Calf Region  No depletion  Edema (RD Assessment)  Mild  Hair  Reviewed  Eyes  Reviewed  Mouth  Reviewed  Skin  Reviewed  Nails  Reviewed     Diet Order:  Diet NPO time specified  EDUCATION NEEDS:   No education needs have been identified at this time  Skin:  Skin Assessment: Skin Integrity Issues: Skin Integrity Issues:: Diabetic Ulcer Diabetic Ulcer: left foot  Last BM:  PTA (09/30/2016 per chart)  Height:   Ht Readings from Last 1 Encounters:  10/01/17 5' 10"  (1.778 m)    Weight:   Wt Readings from Last 1 Encounters:  10/01/17 264 lb 6.4 oz (119.9 kg)    Ideal Body Weight:  75.5 kg  BMI:  Body mass index is 37.94 kg/m.  Estimated Nutritional Needs:   Kcal:  2300-2500 (MSJ x 1.1-1.2)  Protein:  120-140 grams (1-1.2 grams/kg)  Fluid:  2.2-2.6 L/day (30-35 mL/kg IBW)  Willey Blade, MS, RD, LDN Office: [phone] Pager: [phone] After Hours/Weekend Pager: [phone]

## 2017-10-02 NOTE — Transfer of Care (Signed)
Immediate Anesthesia Transfer of Care Note  Patient: [name]  Procedure(s) Performed: AMPUTATION RAY-LEFT 5TH (Left Fifth Toe)  Patient Location: PACU  Anesthesia Type:General  Level of Consciousness: sedated  Airway & Oxygen Therapy: Patient Spontanous Breathing and Patient connected to face mask oxygen  Post-op Assessment: Report given to RN and Post -op Vital signs reviewed and stable  Post vital signs: Reviewed  Last Vitals:  Vitals:   10/02/17 1614 10/02/17 1902  BP: 119/71 125/75  Pulse: 78 90  Resp: 18 16  Temp: 36.4 C   SpO2: 100% 100%    Last Pain:  Vitals:   10/02/17 1614  TempSrc: Oral         Complications: No apparent anesthesia complications

## 2017-10-02 NOTE — Progress Notes (Signed)
[facility] [place] [facility]   PATIENT NAME: [name]    MR#:  [id number]  DATE OF BIRTH:  [birth year]  SUBJECTIVE: Patient is going to OR for amputation of fifth toe.  Denies any complaints scheduled to have a spinal tap tomorrow by radiology for evaluation of multiple sclerosis.  Family is wondering if he can have it here.     CHIEF COMPLAINT:  No chief complaint on file.   REVIEW OF SYSTEMS:    Review of Systems  Constitutional: Negative for chills and fever.  HENT: Negative for hearing loss.   Eyes: Negative for blurred vision, double vision and photophobia.  Respiratory: Negative for cough, hemoptysis and shortness of breath.   Cardiovascular: Negative for palpitations, orthopnea and leg swelling.  Gastrointestinal: Negative for abdominal pain, diarrhea and vomiting.  Genitourinary: Negative for dysuria and urgency.  Musculoskeletal: Positive for falls and joint pain. Negative for myalgias and neck pain.  Skin: Negative for rash.  Neurological: Negative for dizziness, focal weakness, seizures, weakness and headaches.  Psychiatric/Behavioral: Negative for memory loss. The patient does not have insomnia.     Nutrition: Tolerating Diet: Tolerating PT:      DRUG ALLERGIES:  No Known Allergies  VITALS:  Blood pressure 132/80, pulse 69, temperature 97.9 F (36.6 C), temperature source Oral, resp. rate 18, height 5\' 10"  (1.778 m), weight 119.9 kg (264 lb 6.4 oz), SpO2 99 %.  PHYSICAL EXAMINATION:   Physical Exam  GENERAL:  47 y.o.-year-old patient lying in the bed with no acute distress.  EYES: Pupils equal, round, reactive to light and accommodation. No scleral icterus. Extraocular muscles intact.  HEENT: Head atraumatic, normocephalic. Oropharynx and nasopharynx clear.  NECK:  Supple, no jugular venous distention. No thyroid enlargement, no tenderness.  LUNGS: Normal breath sounds bilaterally, no wheezing, rales,rhonchi or  crepitation. No use of accessory muscles of respiration.  CARDIOVASCULAR: S1, S2 normal. No murmurs, rubs, or gallops.  ABDOMEN: Soft, nontender, nondistended. Bowel sounds present. No organomegaly or mass.  EXTREMITIES: No pedal edema, cyanosis, or clubbing.  Dressing present for the left foot. NEUROLOGIC: Cranial nerves II through XII are intact. Muscle strength 5/5 in all extremities. Sensation intact. Gait not checked.  PSYCHIATRIC: The patient is alert and oriented x 3.  SKIN: No obvious rash, lesion, or ulcer.    LABORATORY PANEL:   CBC Recent Labs  Lab 10/02/17 0520  WBC 7.5  HGB 12.0*  HCT 35.0*  PLT 306   ------------------------------------------------------------------------------------------------------------------  Chemistries  Recent Labs  Lab 10/01/17 1257  NA 129*  K 4.4  CL 96*  CO2 23  GLUCOSE 331*  BUN 24*  CREATININE 1.23  CALCIUM 9.1   ------------------------------------------------------------------------------------------------------------------  Cardiac Enzymes No results for input(s): TROPONINI in the last 168 hours. ------------------------------------------------------------------------------------------------------------------  RADIOLOGY:  No results found.   ASSESSMENT AND PLAN:   Active Problems:   Osteomyelitis of ankle or foot, acute (HCC)  #1 .osteomyelitis of the left 5th  toe: Chronic ulcers present on left foot.  Going for amputation of left fifth toe, follow the culture that was drawn by Dr. Trish Fountain pus from site.  For OR for amputation of left fifth toe, continue vancomycin, Zosyn, PICC line consult for 4 weeks of IV antibiotics as per Dr. Sampson Goon recommendation.  Follow cultures from the wound. CRP 5.4.     2.  Diabetes mellitus type 2: Hemoglobin A1c 9.1, sugars ranging between 200- 230s.,  Discontinue NovoLog 70/30, start Lantus 40 units nightly, NovoLog 5 units  3 times daily, increase the correction scale  to moderate as per diabetic nurse recommendation.   3.  Recent history of falls, confusion: Followed by Dr. Malvin Johns, had abnormal MRI and patient supposed to get spinal tap to evaluate for multiple sclerosis.  I ordered a spinal tap for tomorrow if possible radiologist can do it before we discharge the patient. Essential hypertension: Controlled.    #4/ hyponatremia due to dehydration: Continue gentle hydration.  Recheck sodium tomorrow.  All the records are reviewed and case discussed with Care Management/Social Workerr. Management plans discussed with the patient, family and they are in agreement.  CODE STATUS: full TOTAL TIME TAKING CARE OF THIS PATIENT:35 minutes.   POSSIBLE D/C IN 1-2DAYS, DEPENDING ON CLINICAL CONDITION.   Katha Hamming M.D on 10/02/2017 at 2:16 PM  Between 7am to 6pm - Pager - [phone]  After 6pm go to [email or web address] - password [email or web address] [facility]  [facility]  Office  [phone]  CC: Primary care physician; Polo Riley, MD

## 2017-10-02 NOTE — Progress Notes (Signed)
Patient went to surgery via bed. Report called. NSL in LFA in place. Signed consent on chart. Family aware.

## 2017-10-02 NOTE — Progress Notes (Signed)
Inpatient Diabetes Program Recommendations  AACE/ADA: New Consensus Statement on Inpatient Glycemic Control (2015)  Target Ranges:  Prepandial:   less than 140 mg/dL      Peak postprandial:   less than 180 mg/dL (1-2 hours)      Critically ill patients:  140 - 180 mg/dL   Results for ABYAN, CADMAN (MRN [id number]) as of 10/02/2017 12:03  Ref. Range 10/01/2017 12:57 10/01/2017 17:52 10/01/2017 21:03  Glucose-Capillary Latest Ref Range: 65 - 99 mg/dL 657 (H) 846 (H) 962 (H)   Results for DANDRAE, KUSTRA (MRN [id number]) as of 10/02/2017 12:03  Ref. Range 10/02/2017 07:49 10/02/2017 11:47  Glucose-Capillary Latest Ref Range: 65 - 99 mg/dL 401 (H) 027 (H)    Home DM Meds: 70/30 Insulin- 35 units AM/ 50 units PM- taking inconsistently   Current Insulin Orders: Lantus 40 units QHS      Novolog Moderate Correction Scale/ SSI (0-15 units) TID AC + HS      Novolog 5 units TID with meals       MD- Note patient NPO today after breakfast for surgery later today.  Fasting and Postprandial CBGs remain elevated.  Please consider the following insulin adjustments after surgery today:  1. Increase Lantus to 45 units QHS  2. Increase Novolog Meal Coverage to: Novolog 7 units TID with meals (hold if pt eats <50% of meal)        --Will follow patient during hospitalization--  Ambrose Finland RN, MSN, CDE Diabetes Coordinator Inpatient Glycemic Control Team Team Pager: [phone] (8a-5p)

## 2017-10-02 NOTE — Anesthesia Post-op Follow-up Note (Signed)
Anesthesia QCDR form completed.        

## 2017-10-03 ENCOUNTER — Encounter: Payer: Self-pay | Admitting: Podiatry

## 2017-10-03 ENCOUNTER — Ambulatory Visit: Admission: RE | Admit: 2017-10-03 | Payer: BLUE CROSS/BLUE SHIELD | Source: Ambulatory Visit

## 2017-10-03 ENCOUNTER — Inpatient Hospital Stay: Payer: BLUE CROSS/BLUE SHIELD

## 2017-10-03 LAB — BASIC METABOLIC PANEL
ANION GAP: 10 (ref 5–15)
BUN: 16 mg/dL (ref 6–20)
CHLORIDE: 100 mmol/L — AB (ref 101–111)
CO2: 22 mmol/L (ref 22–32)
Calcium: 8.7 mg/dL — ABNORMAL LOW (ref 8.9–10.3)
Creatinine, Ser: 1.15 mg/dL (ref 0.61–1.24)
GFR calc non Af Amer: >60 mL/min (ref >60)
GLUCOSE: 221 mg/dL — AB (ref 65–99)
Potassium: 4 mmol/L (ref 3.5–5.1)
Sodium: 132 mmol/L — ABNORMAL LOW (ref 135–145)

## 2017-10-03 LAB — GLUCOSE, CAPILLARY
Glucose-Capillary: 190 mg/dL — ABNORMAL HIGH (ref 65–99)
Glucose-Capillary: 165 mg/dL — ABNORMAL HIGH (ref 65–99)
Glucose-Capillary: 146 mg/dL — ABNORMAL HIGH (ref 65–99)
Glucose-Capillary: 237 mg/dL — ABNORMAL HIGH (ref 65–99)

## 2017-10-03 LAB — VANCOMYCIN, TROUGH: Vancomycin Tr: 21 ug/mL (ref 15–20)

## 2017-10-03 MED ORDER — LIDOCAINE HCL (PF) 1 % IJ SOLN
5.0000 mL | Freq: Once | INTRAMUSCULAR | Status: AC
  Administered 2017-10-03: 5 mL
  Filled 2017-10-03: qty 5

## 2017-10-03 MED ORDER — INSULIN ASPART 100 UNIT/ML ~~LOC~~ SOLN
7.0000 [IU] | Freq: Three times a day (TID) | SUBCUTANEOUS | Status: DC
  Administered 2017-10-03 – 2017-10-04 (×3): 7 [IU] via SUBCUTANEOUS
  Filled 2017-10-03 (×3): qty 1

## 2017-10-03 MED ORDER — INSULIN GLARGINE 100 UNIT/ML ~~LOC~~ SOLN
45.0000 [IU] | Freq: Every day | SUBCUTANEOUS | Status: DC
  Administered 2017-10-03: 45 [IU] via SUBCUTANEOUS
  Filled 2017-10-03 (×2): qty 0.45

## 2017-10-03 MED ORDER — SODIUM CHLORIDE 0.9% FLUSH
10.0000 mL | Freq: Two times a day (BID) | INTRAVENOUS | Status: DC
  Administered 2017-10-03 – 2017-10-04 (×3): 10 mL

## 2017-10-03 MED ORDER — VANCOMYCIN HCL 10 G IV SOLR
1250.0000 mg | Freq: Two times a day (BID) | INTRAVENOUS | Status: DC
  Administered 2017-10-03 – 2017-10-04 (×2): 1250 mg via INTRAVENOUS
  Filled 2017-10-03 (×3): qty 1250

## 2017-10-03 MED ORDER — SODIUM CHLORIDE 0.9% FLUSH: 10.0000 mL | INTRAVENOUS | Status: DC | PRN

## 2017-10-03 NOTE — Progress Notes (Signed)
Patient ID: [name], male   DOB: Feb 09, 1971, 47 y.o.   MRN: [id number] Pt. Stable after LP.F/U with his MD.

## 2017-10-03 NOTE — Progress Notes (Signed)
Peripherally Inserted Central Catheter/Midline Placement  The IV Nurse has discussed with the patient and/or persons authorized to consent for the patient, the purpose of this procedure and the potential benefits and risks involved with this procedure.  The benefits include less needle sticks, lab draws from the catheter, and the patient may be discharged home with the catheter. Risks include, but not limited to, infection, bleeding, blood clot (thrombus formation), and puncture of an artery; nerve damage and irregular heartbeat and possibility to perform a PICC exchange if needed/ordered by physician.  Alternatives to this procedure were also discussed.  Bard Power PICC patient education guide, fact sheet on infection prevention and patient information card has been provided to patient /or left at bedside.    PICC/Midline Placement Documentation  PICC Single Lumen 10/03/17 PICC Right Cephalic 41 cm 1 cm (Active)  Indication for Insertion or Continuance of Line Home intravenous therapies (PICC only) 10/03/2017  2:06 PM  Exposed Catheter (cm) 1 cm 10/03/2017  2:06 PM  Site Assessment Clean;Dry;Intact 10/03/2017  2:06 PM  Line Status Flushed;Saline locked;Blood return noted 10/03/2017  2:06 PM  Dressing Type Transparent 10/03/2017  2:06 PM  Dressing Status Clean;Dry;Intact;Antimicrobial disc in place 10/03/2017  2:06 PM  Line Care Connections checked and tightened 10/03/2017  2:06 PM  Line Adjustment (NICU/IV Team Only) No 10/03/2017  2:06 PM  Dressing Intervention New dressing 10/03/2017  2:06 PM  Dressing Change Due 10/10/17 10/03/2017  2:06 PM       [name] 10/03/2017, 2:09 PM

## 2017-10-03 NOTE — Progress Notes (Signed)
ID E note [name] Kullman Reeseis a 47 y.o.malewith poorly controlled IDDM (A1c 8.1) , HTN, HLD admitted with worsening of L foot infection with Xray showing OM of L 5th MT. He has chronic ulceration of the L foot and follows with Dr Cleda Mccreedy as otpt. Currently I am able to epxress mod amount of pus from the site and have cultured it.Planned amputation of L 5th toe and MT head on 1/9.No otpt cultures available. Gram stain here with GPC in clusters- suspect it will be staph aureus Discussed with Dr Cleda Mccreedy and with patient need for IV abx.  ESR 102, CRP 5.  Recommendations S.p surgery 10/02/17 - L 5th ray resection Placing  PICC line today  Will likely 4 weeks IV abx If no MRSA or Pseduomonas anticipate:  Unasyn 12 gm IV continuous over 24 hours.  Hopefully will have more culture data by tomorrow

## 2017-10-03 NOTE — Progress Notes (Signed)
Inpatient Diabetes Program Recommendations  AACE/ADA: New Consensus Statement on Inpatient Glycemic Control (2015)  Target Ranges:  Prepandial:   less than 140 mg/dL      Peak postprandial:   less than 180 mg/dL (1-2 hours)      Critically ill patients:  140 - 180 mg/dL   Lab Results  Component Value Date   GLUCAP 190 (H) 10/03/2017   HGBA1C 9.1 (H) 10/02/2017    Review of Glycemic Control  Results for [name], [name] (MRN [id number]) as of 10/03/2017 08:45  Ref. Range 10/02/2017 16:16 10/02/2017 19:08 10/02/2017 21:46 10/03/2017 08:23  Glucose-Capillary Latest Ref Range: 65 - 99 mg/dL 409 (H) 811 (H) 914 (H) 190 (H)   Home DM Meds: 70/30 Insulin- 35 units AM/ 50 units PM- taking inconsistently   Current Insulin Orders: Lantus 42 units QHS, Novolog Moderate Correction Scale/ SSI (0-15 units) TID AC, Novolog 0-5 units qhs, Novolog 5 units TID with meals   Fasting and Postprandial CBGs remain elevated. Consider increasing Lantus to 45 units QHS.     Increase Novolog Meal Coverage to: Novolog 7 units TID with meals (hold if pt eats <50% of meal)   [name] Racer, RN, [place], [place], CDE Diabetes Coordinator Inpatient Diabetes Program  [phone] (Team Pager) [phone] [facility] Office) 10/03/2017 8:49 AM

## 2017-10-03 NOTE — Care Management Note (Addendum)
Case Management Note  Patient Details  Name: JAKEOB ATHERTON MRN: [id number] Date of Birth: 11/29/1970  Subjective/Objective:   Chart reviewed. Patient in the process of getting a PICC. Will discharge tomorrow or the next day with IV antibiotics. Referral to [facility] with [facility] for IV antibiotics.                  Action/Plan: Wilfrid Lund speak further with patient following PICC line placement.   Expected Discharge Date:                  Expected Discharge Plan:  Home w Home Health Services  In-House Referral:     Discharge planning Services  CM Consult  Post Acute Care Choice:  Home Health Choice offered to:     DME Arranged:    DME Agency:     HH Arranged:    HH Agency:     Status of Service:  In process, will continue to follow  If discussed at Long Length of Stay Meetings, dates discussed:    Additional Comments:  Marily Memos, RN 10/03/2017, 1:35 PM

## 2017-10-03 NOTE — Consult Note (Addendum)
Pharmacy Antibiotic Note  [name] is a 47 y.o. male admitted on 10/01/2017 with Osteomyelitis of left foot.  Pharmacy has been consulted for vancomycin and zosyn  dosing.  Plan: Ke: 0.084   T1/2: 8.25   Vd: 65   DW: 92kg  Will order Vancomycin 2g IV, followed by vancomycin 1500mg  IV every 12 hours with 8 hour stack dosing. Calculated trough at Css is 16.4. Trough level ordered prior to 4th dose. Will monitor renal function and adjust dose as needed.   Start Zosyn 3.375 IV EI every 8 hours.   1/10:  Vancomycin trough= 21 mcg/ml.  Will adjust dose to 1250 mg IV q12h  With dose to start in evening.  Patient also on Zosyn- watch renal fxn. Patient at risk for accumulation with obesity. Will check next level prior to 4th dose on 10/05/17 to monitor for accumulation    Height: 5\' 10"  (177.8 cm) Weight: 264 lb 6.4 oz (119.9 kg) IBW/kg (Calculated) : 73  Temp (24hrs), Avg:98 F (36.7 C), Min:97.5 F (36.4 C), Max:99.1 F (37.3 C)  Recent Labs  Lab 10/01/17 1257 10/02/17 0520 10/03/17 0421 10/03/17 1132  WBC 7.5 7.5  --   --   CREATININE 1.23  --  1.15  --   VANCOTROUGH  --   --   --  21*    Estimated Creatinine Clearance: 103.1 mL/min (by C-G formula based on SCr of 1.15 mg/dL).    No Known Allergies  Antimicrobials this admission: 1/8 vancomycin  >>  1/8 zosyn  >>   Dose adjustments this admission:  Microbiology results:  Thank you for allowing pharmacy to be a part of this patient's care.  Angelique Blonder, PharmD, BCPS Clinical Pharmacist 10/03/2017 12:38 PM

## 2017-10-03 NOTE — Progress Notes (Signed)
[facility] - [place] at [facility]   PATIENT NAME: [name]    MR#:  [id number]  DATE OF BIRTH:  June 07, 1971  SUBJECTIVE: Patient doing well, no complaints, status post amputation of left fifth toe yesterday.    CHIEF COMPLAINT:  No chief complaint on file.   REVIEW OF SYSTEMS:    Review of Systems  Constitutional: Negative for chills and fever.  HENT: Negative for hearing loss.   Eyes: Negative for blurred vision, double vision and photophobia.  Respiratory: Negative for cough, hemoptysis and shortness of breath.   Cardiovascular: Negative for palpitations, orthopnea and leg swelling.  Gastrointestinal: Negative for abdominal pain, diarrhea and vomiting.  Genitourinary: Negative for dysuria and urgency.  Musculoskeletal: Positive for falls and joint pain. Negative for myalgias and neck pain.  Skin: Negative for rash.  Neurological: Negative for dizziness, focal weakness, seizures, weakness and headaches.  Psychiatric/Behavioral: Negative for memory loss. The patient does not have insomnia.     Nutrition: Tolerating Diet: Tolerating PT:      DRUG ALLERGIES:  No Known Allergies  VITALS:  Blood pressure 111/69, pulse 93, temperature 97.9 F (36.6 C), temperature source Oral, resp. rate 16, height 5\' 10"  (1.778 m), weight 119.9 kg (264 lb 6.4 oz), SpO2 98 %.  PHYSICAL EXAMINATION:   Physical Exam  GENERAL:  47 y.o.-year-old patient lying in the bed with no acute distress.  EYES: Pupils equal, round, reactive to light and accommodation. No scleral icterus. Extraocular muscles intact.  HEENT: Head atraumatic, normocephalic. Oropharynx and nasopharynx clear.  NECK:  Supple, no jugular venous distention. No thyroid enlargement, no tenderness.  LUNGS: Normal breath sounds bilaterally, no wheezing, rales,rhonchi or crepitation. No use of accessory muscles of respiration.  CARDIOVASCULAR: S1, S2 normal. No murmurs, rubs, or gallops.  ABDOMEN:  Soft, nontender, nondistended. Bowel sounds present. No organomegaly or mass.  EXTREMITIES: No pedal edema, cyanosis, or clubbing.  Dressing present for the left foot. NEUROLOGIC: Cranial nerves II through XII are intact. Muscle strength 5/5 in all extremities. Sensation intact. Gait not checked.  PSYCHIATRIC: The patient is alert and oriented x 3.  SKIN: No obvious rash, lesion, or ulcer.    LABORATORY PANEL:   CBC Recent Labs  Lab 10/02/17 0520  WBC 7.5  HGB 12.0*  HCT 35.0*  PLT 306   ------------------------------------------------------------------------------------------------------------------  Chemistries  Recent Labs  Lab 10/03/17 0421  NA 132*  K 4.0  CL 100*  CO2 22  GLUCOSE 221*  BUN 16  CREATININE 1.15  CALCIUM 8.7*   ------------------------------------------------------------------------------------------------------------------  Cardiac Enzymes No results for input(s): TROPONINI in the last 168 hours. ------------------------------------------------------------------------------------------------------------------  RADIOLOGY:  Dg Fluoro Guided Loc Of Needle/cath Tip For Spinal Inject Lt  Result Date: 10/03/2017 CLINICAL DATA:  Evaluate for MS. EXAM: DIAGNOSTIC LUMBAR PUNCTURE UNDER FLUOROSCOPIC GUIDANCE FLUOROSCOPY TIME:  Fluoroscopy Time:  0 minutes 42 seconds Radiation Exposure Index (if provided by the fluoroscopic device): 26.8 mGy Number of Acquired Spot Images: 1 PROCEDURE: Informed consent was obtained from the patient prior to the procedure, including potential complications of headache, allergy, and pain. With the patient prone, the lower back was prepped with Betadine. 1% Lidocaine was used for local anesthesia. Lumbar puncture was performed at the L4-L5 level using a 22 gauge needle with return of initially clear CSF. After patient movement over some blood tinging within the CSF. Due to the patient's discomfort pressures were not obtained. The  patient tolerated the procedure well and there were no apparent complications. IMPRESSION: Successful  fluoroscopically directed lumbar puncture. CSF sent for laboratory evaluation. Electronically Signed   By: Maisie Fus  Register   On: 10/03/2017 08:25     ASSESSMENT AND PLAN:   Active Problems:   Osteomyelitis of ankle or foot, acute (HCC)  #1 .osteomyelitis of the left 5th  toe: Chronic ulcers present on left foot.  Status post amputation of left fifth toe.  Continue IV vancomycin Zosyn, appreciate Dr Sampson Goon following the patient, patient is get getting PICC line today Dressing changes by podiatry tomorrow, likely discharge later  tomorrow or day after.  2.  Diabetes mellitus type 2: Hemoglobin A1c 9.1, reviewed .Marland Kitchen  Diabetic nurse recommendation to increase Lantus to 45 units nightly, NovoLog 7 units 3 times daily.   3.  Recent history of falls, confusion: Followed by Dr. Malvin Johns, had abnormal MRI , status post spinal tap today.  Will follow   Essential hypertension: Controlled.    #4/ hyponatremia due to dehydration: Continue gentle hydration.  Recheck sodium tomorrow.  All the records are reviewed and case discussed with Care Management/Social Workerr. Management plans discussed with the patient, family and they are in agreement.  CODE STATUS: full TOTAL TIME TAKING CARE OF THIS PATIENT:35 minutes.   POSSIBLE D/C IN 1-2DAYS, DEPENDING ON CLINICAL CONDITION.   Katha Hamming M.D on 10/03/2017 at 1:24 PM  Between 7am to 6pm - Pager - [phone]  After 6pm go to [email or web address] - password [email or web address]  [facility]  Office  [phone]  CC: Primary care physician; Polo Riley, MD

## 2017-10-03 NOTE — Progress Notes (Signed)
1 Day Post-Op   Subjective/Chief Complaint: Patient seen.  No complaints of pain in the foot.   Objective: Vital signs in last 24 hours: Temp:  [97.5 F (36.4 C)-99.1 F (37.3 C)] 97.9 F (36.6 C) (01/10 0730) Pulse Rate:  [78-98] 93 (01/10 0730) Resp:  [15-19] 16 (01/10 0730) BP: (111-126)/(61-79) 111/69 (01/10 0730) SpO2:  [97 %-100 %] 98 % (01/10 0730) Last BM Date: 09/30/17  Intake/Output from previous day: 01/09 0701 - 01/10 0700 In: 620 [P.O.:120; I.V.:500] Out: -  Intake/Output this shift: No intake/output data recorded.  With the bandage on the left foot is dry and intact.  No evidence of any strikethrough or bleeding.  Lab Results:  Recent Labs    10/01/17 1257 10/02/17 0520  WBC 7.5 7.5  HGB 12.1* 12.0*  HCT 34.5* 35.0*  PLT 285 306   BMET Recent Labs    10/01/17 1257 10/03/17 0421  NA 129* 132*  K 4.4 4.0  CL 96* 100*  CO2 23 22  GLUCOSE 331* 221*  BUN 24* 16  CREATININE 1.23 1.15  CALCIUM 9.1 8.7*   PT/INR No results for input(s): LABPROT, INR in the last 72 hours. ABG No results for input(s): PHART, HCO3 in the last 72 hours.  Invalid input(s): PCO2, PO2  Studies/Results: Dg Fluoro Guided Loc Of Needle/cath Tip For Spinal Inject Lt  Result Date: 10/03/2017 CLINICAL DATA:  Evaluate for MS. EXAM: DIAGNOSTIC LUMBAR PUNCTURE UNDER FLUOROSCOPIC GUIDANCE FLUOROSCOPY TIME:  Fluoroscopy Time:  0 minutes 42 seconds Radiation Exposure Index (if provided by the fluoroscopic device): 26.8 mGy Number of Acquired Spot Images: 1 PROCEDURE: Informed consent was obtained from the patient prior to the procedure, including potential complications of headache, allergy, and pain. With the patient prone, the lower back was prepped with Betadine. 1% Lidocaine was used for local anesthesia. Lumbar puncture was performed at the L4-L5 level using a 22 gauge needle with return of initially clear CSF. After patient movement over some blood tinging within the CSF. Due to  the patient's discomfort pressures were not obtained. The patient tolerated the procedure well and there were no apparent complications. IMPRESSION: Successful fluoroscopically directed lumbar puncture. CSF sent for laboratory evaluation. Electronically Signed   By: Maisie Fus  Register   On: 10/03/2017 08:25    Anti-infectives: Anti-infectives (From admission, onward)   Start     Dose/Rate Route Frequency Ordered Stop   10/02/17 0000  vancomycin (VANCOCIN) 1,500 mg in sodium chloride 0.9 % 500 mL IVPB     1,500 mg 250 mL/hr over 120 Minutes Intravenous Every 12 hours 10/01/17 1447     10/01/17 1400  vancomycin (VANCOCIN) 2,000 mg in sodium chloride 0.9 % 500 mL IVPB     2,000 mg 250 mL/hr over 120 Minutes Intravenous  Once 10/01/17 1302 10/01/17 1634   10/01/17 1400  piperacillin-tazobactam (ZOSYN) IVPB 3.375 g     3.375 g 12.5 mL/hr over 240 Minutes Intravenous Every 8 hours 10/01/17 1336        Assessment/Plan: s/p Procedure(s): AMPUTATION RAY-LEFT 5TH (Left) Assessment: Stable status post debridement left foot.   Plan: Dressing left intact today.  We will plan for dressing change tomorrow.  Would anticipate that the patient will be stable for discharge anytime after tomorrow pending appropriate antibiotic selection from infectious disease.  LOS: 2 days    Ricci Barker 10/03/2017

## 2017-10-04 LAB — GLUCOSE, CAPILLARY
Glucose-Capillary: 189 mg/dL — ABNORMAL HIGH (ref 65–99)
Glucose-Capillary: 184 mg/dL — ABNORMAL HIGH (ref 65–99)

## 2017-10-04 LAB — SURGICAL PATHOLOGY

## 2017-10-04 LAB — CREATININE, SERUM
CREATININE: 1.17 mg/dL (ref 0.61–1.24)
GFR calc Af Amer: >60 mL/min (ref >60)

## 2017-10-04 MED ORDER — AMPICILLIN-SULBACTAM IV (FOR PTA / DISCHARGE USE ONLY): 12.0000 g | Freq: Every day | INTRAVENOUS | 0 refills | Status: AC

## 2017-10-04 MED ORDER — SODIUM CHLORIDE 0.9 % IV SOLN
3.0000 g | Freq: Four times a day (QID) | INTRAVENOUS | Status: DC
  Administered 2017-10-04: 3 g via INTRAVENOUS
  Filled 2017-10-04 (×3): qty 3

## 2017-10-04 MED ORDER — ADULT MULTIVITAMIN W/MINERALS CH: 1.0000 | ORAL_TABLET | Freq: Every day | ORAL | 0 refills | Status: DC

## 2017-10-04 MED ORDER — INSULIN NPH ISOPHANE & REGULAR (70-30) 100 UNIT/ML ~~LOC~~ SUSP: 30.0000 [IU] | Freq: Two times a day (BID) | SUBCUTANEOUS | 11 refills | Status: DC

## 2017-10-04 MED ORDER — PREMIER PROTEIN SHAKE: 11.0000 [oz_av] | Freq: Two times a day (BID) | ORAL | 0 refills | Status: DC

## 2017-10-04 NOTE — Progress Notes (Signed)
Discharging home today with IV Unasyn 12 g continuous over 24 hours as per ID recommendation, discharge instructions are in the computer regarding diabetic medications, discussed with patient about all medications.  Dressings have been changed by Dr. Clide Cliff and patient is cleared for discharge.  Discussed with patient and his mom.  Follow-up with Dr. Sampson Goon, Dr. Graciela Husbands, follow-up with Dr. Malvin Johns also regarding lumbar puncture results.

## 2017-10-04 NOTE — Progress Notes (Signed)
PHARMACY CONSULT NOTE FOR:  OUTPATIENT  PARENTERAL ANTIBIOTIC THERAPY (OPAT)  Indication: Osteomyelitis Regimen: Unasyn 12 grams IV Daily as a Continuous Infusion End date: 10/30/2017  IV antibiotic discharge orders are pended. To discharging provider:  please sign these orders via discharge navigator,  Select New Orders & click on the button choice - Manage This Unsigned Work.     Thank you for allowing pharmacy to be a part of this patient's care.  [name] A 10/04/2017, 12:04 PM

## 2017-10-04 NOTE — Consult Note (Signed)
Pharmacy Antibiotic Note  [name] is a 47 y.o. male admitted on 10/01/2017 with Osteomyelitis of left foot.  Pharmacy has been consulted for Unasyn dosing.  Plan: Begin Unasyn 3 gram IV q6h    Height: 5\' 10"  (177.8 cm) Weight: 264 lb 6.4 oz (119.9 kg) IBW/kg (Calculated) : 73  Temp (24hrs), Avg:98.3 F (36.8 C), Min:98 F (36.7 C), Max:98.5 F (36.9 C)  Recent Labs  Lab 10/01/17 1257 10/02/17 0520 10/03/17 0421 10/03/17 1132 10/04/17 0513  WBC 7.5 7.5  --   --   --   CREATININE 1.23  --  1.15  --  1.17  VANCOTROUGH  --   --   --  21*  --     Estimated Creatinine Clearance: 101.3 mL/min (by C-G formula based on SCr of 1.17 mg/dL).    No Known Allergies  Antimicrobials this admission: 1/8 vancomycin  >> 1/11 1/8 zosyn  >> 1/11  Dose adjustments this admission:  Microbiology results:  Thank you for allowing pharmacy to be a part of this patient's care.  Angelique Blonder, PharmD, BCPS Clinical Pharmacist 10/04/2017 10:37 AM

## 2017-10-04 NOTE — Progress Notes (Addendum)
2 Days Post-Op   Subjective/Chief Complaint: Patient seen.  No complaints.  States that he is already scheduled for his IV antibiotics and home health care.   Objective: Vital signs in last 24 hours: Temp:  [98 F (36.7 C)-98.5 F (36.9 C)] 98.1 F (36.7 C) (01/11 0816) Pulse Rate:  [81-94] 81 (01/11 0816) Resp:  [18-19] 19 (01/11 0350) BP: (118-129)/(73-84) 121/75 (01/11 0816) SpO2:  [97 %-100 %] 99 % (01/11 0816) Last BM Date: 09/30/16  Intake/Output from previous day: 01/10 0701 - 01/11 0700 In: 1210 [P.O.:360; IV Piggyback:850] Out: -  Intake/Output this shift: No intake/output data recorded.  Some moderate bleeding is noted on the bandaging on the left foot, mostly along the plantar aspect.  Upon removal the dorsal incision is well coapted with no drainage.  Some active bleeding and drainage but no signs of purulence at the plantar ulceration.  Lab Results:  Recent Labs    10/01/17 1257 10/02/17 0520  WBC 7.5 7.5  HGB 12.1* 12.0*  HCT 34.5* 35.0*  PLT 285 306   BMET Recent Labs    10/01/17 1257 10/03/17 0421 10/04/17 0513  NA 129* 132*  --   K 4.4 4.0  --   CL 96* 100*  --   CO2 23 22  --   GLUCOSE 331* 221*  --   BUN 24* 16  --   CREATININE 1.23 1.15 1.17  CALCIUM 9.1 8.7*  --    PT/INR No results for input(s): LABPROT, INR in the last 72 hours. ABG No results for input(s): PHART, HCO3 in the last 72 hours.  Invalid input(s): PCO2, PO2  Studies/Results: Dg Fluoro Guided Loc Of Needle/cath Tip For Spinal Inject Lt  Result Date: 10/03/2017 CLINICAL DATA:  Evaluate for MS. EXAM: DIAGNOSTIC LUMBAR PUNCTURE UNDER FLUOROSCOPIC GUIDANCE FLUOROSCOPY TIME:  Fluoroscopy Time:  0 minutes 42 seconds Radiation Exposure Index (if provided by the fluoroscopic device): 26.8 mGy Number of Acquired Spot Images: 1 PROCEDURE: Informed consent was obtained from the patient prior to the procedure, including potential complications of headache, allergy, and pain. With the  patient prone, the lower back was prepped with Betadine. 1% Lidocaine was used for local anesthesia. Lumbar puncture was performed at the L4-L5 level using a 22 gauge needle with return of initially clear CSF. After patient movement over some blood tinging within the CSF. Due to the patient's discomfort pressures were not obtained. The patient tolerated the procedure well and there were no apparent complications. IMPRESSION: Successful fluoroscopically directed lumbar puncture. CSF sent for laboratory evaluation. Electronically Signed   By: Maisie Fus  Register   On: 10/03/2017 08:25    Anti-infectives: Anti-infectives (From admission, onward)   Start     Dose/Rate Route Frequency Ordered Stop   10/04/17 1200  Ampicillin-Sulbactam (UNASYN) 3 g in sodium chloride 0.9 % 100 mL IVPB     3 g 200 mL/hr over 30 Minutes Intravenous Every 6 hours 10/04/17 1036     10/03/17 1800  vancomycin (VANCOCIN) 1,250 mg in sodium chloride 0.9 % 250 mL IVPB  Status:  Discontinued     1,250 mg 166.7 mL/hr over 90 Minutes Intravenous Every 12 hours 10/03/17 1237 10/04/17 1033   10/02/17 0000  vancomycin (VANCOCIN) 1,500 mg in sodium chloride 0.9 % 500 mL IVPB  Status:  Discontinued     1,500 mg 250 mL/hr over 120 Minutes Intravenous Every 12 hours 10/01/17 1447 10/03/17 1237   10/01/17 1400  vancomycin (VANCOCIN) 2,000 mg in sodium chloride 0.9 %  500 mL IVPB     2,000 mg 250 mL/hr over 120 Minutes Intravenous  Once 10/01/17 1302 10/01/17 1634   10/01/17 1400  piperacillin-tazobactam (ZOSYN) IVPB 3.375 g  Status:  Discontinued     3.375 g 12.5 mL/hr over 240 Minutes Intravenous Every 8 hours 10/01/17 1336 10/04/17 1033      Assessment/Plan: s/p Procedure(s): AMPUTATION RAY-LEFT 5TH (Left) Assessment: Stable status post debridement and ray resection left foot.   Plan: Betadine applied to the incision followed by sterile dry bandage.  Patient is stable for discharge with his IV antibiotics and home health care.   Recommend home health care for dressing changes 3 times a week.  1 of the dressing changes can be performed by myself outpatient at his follow-up visits in the clinic.  Has an appointment scheduled for next Thursday which we will keep.  Recommend dressing change on Monday and then I will reevaluate on Thursday.  Stable for discharge later today.  LOS: 3 days    Ricci Barker 10/04/2017

## 2017-10-04 NOTE — Progress Notes (Signed)
Inpatient Diabetes Program Recommendations  AACE/ADA: New Consensus Statement on Inpatient Glycemic Control (2015)  Target Ranges:  Prepandial:   less than 140 mg/dL      Peak postprandial:   less than 180 mg/dL (1-2 hours)      Critically ill patients:  140 - 180 mg/dL   Lab Results  Component Value Date   GLUCAP 189 (H) 10/04/2017   HGBA1C 9.1 (H) 10/02/2017    Review of Glycemic Control  Results for BIFF, CHARON (MRN [id number]) as of 10/04/2017 09:07  Ref. Range 10/03/2017 08:23 10/03/2017 11:43 10/03/2017 16:50 10/03/2017 21:05 10/04/2017 07:48  Glucose-Capillary Latest Ref Range: 65 - 99 mg/dL 211 (H) 941 (H) 740 (H) 237 (H) 189 (H)    Home DM Meds:70/30 Insulin- 35 units AM/ 50 units PM-taking inconsistently  Current Insulin Orders:Lantus 45 units QHS, Novolog Moderate Correction Scale/ SSI (0-15 units) TID AC, Novolog 0-5 units qhs,Novolog 7 units TID with meals   Fasting and Postprandial CBGs remain elevated. Consider increasing Lantus to 48 units QHS.     Increase Novolog Meal Coverage to: Novolog 8 units TID with meals(hold if pt eats <50% of meal)  For discharge- consider discharging home on Novolin 70/30 36 units bid -this will give him 50 units of basal and 21.6 units of meal coverage daily .  Please stress the importance of taking it consistently- without fail.  DO NOT SKIP.  This is critical for wound healing. Check blood sugars 4x/day- pre-meals and hs.    Susette Racer, RN, BA, MHA, CDE Diabetes Coordinator Inpatient Diabetes Program  [phone] (Team Pager) [phone] [facility] Office) 10/04/2017 9:18 AM

## 2017-10-04 NOTE — Progress Notes (Signed)
A &O. Room air. Pt reports no pain. Takes meds ok. PICC was left in arm due to long term abx post discharge. Presciptions and discharge instructions given to pt. Mom to take home. Pt has no further concerns a this time.

## 2017-10-04 NOTE — Anesthesia Postprocedure Evaluation (Signed)
Anesthesia Post Note  Patient: [name]  Procedure(s) Performed: AMPUTATION RAY-LEFT 5TH (Left Fifth Toe)  Patient location during evaluation: PACU Anesthesia Type: General Level of consciousness: awake and alert Pain management: pain level controlled Vital Signs Assessment: post-procedure vital signs reviewed and stable Respiratory status: spontaneous breathing, nonlabored ventilation, respiratory function stable and patient connected to nasal cannula oxygen Cardiovascular status: blood pressure returned to baseline and stable Postop Assessment: no apparent nausea or vomiting Anesthetic complications: no     Last Vitals:  Vitals:   10/04/17 0350 10/04/17 0816  BP: 129/84 121/75  Pulse: 89 81  Resp: 19   Temp: 36.7 C 36.7 C  SpO2: 100% 99%    Last Pain:  Vitals:   10/04/17 0816  TempSrc: Oral  PainSc:                  Lenard Simmer

## 2017-10-04 NOTE — Progress Notes (Signed)
Infectious Disease Long Term IV Antibiotic Orders [name] 28-Jan-1971  Diagnosis: L 5th MT DM foot ulcer with osteomyelitis  Culture results Aerobic Culture (superficial specimen) [[id number]] Collected: 10/01/17 1624  Order Status: Completed Specimen: Wound from Foot Updated: 10/03/17 1311   Specimen Description --   FOOT LEFT  Performed at [facility], [address], [place], [place] [address]    Special Requests --   Normal  Performed at [facility], [place], [place] [address]    Gram Stain --   RARE WBC PRESENT, PREDOMINANTLY PMN  RARE GRAM POSITIVE COCCI IN CLUSTERS    Culture --   CULTURE REINCUBATED FOR BETTER GROWTH  Performed at [facility], [place] [address], [place], [place] [address]    Report Status PENDING    LABS Lab Results  Component Value Date   CREATININE 1.17 10/04/2017   Lab Results  Component Value Date   WBC 7.5 10/02/2017   HGB 12.0 (L) 10/02/2017   HCT 35.0 (L) 10/02/2017   MCV 85.6 10/02/2017   PLT 306 10/02/2017   Lab Results  Component Value Date   ESRSEDRATE 103 (H) 10/02/2017   Lab Results  Component Value Date   CRP 5.4 (H) 10/02/2017    Allergies: No Known Allergies  Discharge antibiotics Unasyn 12 gm IV continuous per 24 hours.  PICC Care per protocol Labs weekly while on IV antibiotics -FAX weekly labs to [phone] CBC w diff   Comprehensive met panel ESR  CRP   Planned duration of antibiotics  4-6 weeks from surgery   Stop date Feb 6   Follow up clinic date 2-3 weeks   Leonel Ramsay, MD

## 2017-10-05 LAB — AEROBIC CULTURE W GRAM STAIN (SUPERFICIAL SPECIMEN): Special Requests: NORMAL

## 2017-10-07 LAB — OLIGOCLONAL BANDS, CSF + SERM

## 2017-10-07 NOTE — Discharge Summary (Signed)
[name], is a 47 y.o. male  DOB [birth year]  MRN [id number].  Admission date:  10/01/2017  Admitting Physician  Nicholes Mango, MD  Discharge Date:  10/04/2017   Primary MD  Melinda Crutch, MD  Recommendations for primary care physician for things to follow:   Follow-up with Dr. Bertell Maria podiatry 3 days that is  January 14 for dressing changes and reevaluating the wound.   Patient can follow-up with Dr. Melrose Nakayama from neurology Regarding his spinal tap results for evaluation of multiple sclerosis.  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 Diabetes mellitus without complication ([place])   . Hypertension     Past Surgical History:  Procedure Laterality Date  . AMPUTATION Left 10/02/2017   Procedure: AMPUTATION RAY-LEFT 5TH;  Surgeon: Sharlotte Alamo, DPM;  Location: [facility] ORS;  Service: Podiatry;  Laterality: Left;  . TOE AMPUTATION         History of present illness and  Hospital Course:     Kindly see H&P for history of present illness and admission details, please review complete Labs, Consult reports and Test reports for all details in brief  HPI  from the history and physical done on the day of admission 47 year old male patient admitted on January 8 as a direct admit from podiatry office because of nonhealing left foot wound, x-ray of the left foot showed osteomyelitis of the left the fifth toe.  Patient was here recently discharged home with antibiotics and went to see Dr. Sharlotte Alamo in the office but because of nonhealing ulcer on the left foot he is referred for admission.   Hospital Course  #1. left fifth toe osteomyelitis  status post amputation of left fifth toe by podiatry, patient does have chronic ulcers, seen by Dr. Ola Spurr recommended IV antibiotics Unasyn 12 g IV continuous every 24 hours, stop date for antibiotic is February 6..  Follow-up in the clinic in 2-3 weeks with Dr. Ola Spurr.Discharge antibiotics Unasyn 12 gm IV continuous per 24 hours.  PICC Care per protocol Labs weekly while on IV antibiotics -FAX weekly labs to [phone] CBC w diff         Comprehensive met panel ESR      CRP       Planned duration of antibiotics  4-6 weeks from surgery   Stop date        Feb 6               Follow up clinic date            2-3 weeks           #2 diabetes mellitus type 2: Most recent hemoglobin A1c 9, uncontrolled: restarted his Novolin 70/30 36 units in the morning, 50 units at night at discharge..  But he received a Levemir, NovoLog as per diabetic nurse instructions during the hospitalization but at discharge we resume his home dose insulin Novolin 70/30 at 36 units in the morning, 50 units at night.   #3 recent falls, confusion episodes: Followed by Dr.potterfrom neurology, patient had abnormal MRI of the brain, patient supposed to get followed up as an outpatient .  But he got admitted so he did get followed up.  And patient advised to follow-up with Dr. Melrose Nakayama for evaluation of multiple  sclerosis. Discharged home with home health physical therapy, [facility]. #4 essential hypertension: Controlled 5.  Hyponatremia: Due to dehydration: Corrected   Discharge Condition:stable   Follow UP  Follow-up Information    Melinda Crutch, MD Follow up in 1 week(s).   Specialty:  Pediatrics Why:  Patient is not longer w/ Dr. Anderson Malta per Office Contact information: [place] [address] [phone]        Sharlotte Alamo, DPM. Schedule an appointment as soon as possible for a visit on 10/10/2017.   Specialty:  Podiatry Why:  @ 3:00 pm Contact  information: [place] [address] [phone]        Leonel Ramsay, MD In 1 week.   Specialty:  Infectious Diseases Why:  103 office will call Patient w/ Appt Contact information: [place] [address] [phone]             Discharge Instructions  and  Discharge Medications    Discharge Instructions    Face-to-face encounter (required for Medicare/Medicaid patients)   Complete by:  As directed    I Epifanio Lesches certify that this patient is under my care and that I, or a nurse practitioner or physician's assistant working with me, had a face-to-face encounter that meets the physician face-to-face encounter requirements with this patient on 10/04/2017. The encounter with the patient was in whole, or in part for the following medical condition(s) which is the primary reason for home health care Diabetic foot infection foot Hypertension Multiple falls   The encounter with the patient was in whole, or in part, for the following medical condition, which is the primary reason for home health care:  whole   I certify that, based on my findings, the following services are medically necessary home health services:   Nursing Physical therapy     Reason for Medically Necessary Home Health Services:  Therapy- Personnel officer, Public librarian   My clinical findings support the need for the above services:  Unable to leave home safely without assistance and/or assistive device   Further, I certify that my clinical findings support that this patient is homebound due to:  Unable to leave home safely without assistance   Home Health   Complete by:  As directed    To provide the following care/treatments:   PT [place] work     Home infusion instructions [place] May follow [place] Dosing Protocol; May administer Cathflo as needed to  maintain patency of vascular access device.; [place] of vascular access device: per [facility] Protocol: 0.9% NaCl pre/post medica...   Complete by:  As directed    Instructions:  May follow [place] Dosing Protocol   Instructions:  May administer Cathflo as needed to maintain patency of vascular access device.   Instructions:  [place]ushing of vascular access device: per [facility] Protocol: 0.9% NaCl pre/post medication administration and prn patency; Heparin 100 u/ml, 79m for implanted ports and Heparin 10u/ml, 567mfor all other central venous catheters.   Instructions:  May follow [facility] Anaphylaxis Protocol for First Dose Administration in the home: 0.9% NaCl at 25-50 ml/hr to maintain IV access for protocol meds. Epinephrine 0.3 ml IV/IM PRN and Benadryl 25-50 IV/IM PRN s/s of anaphylaxis.   Instructions:  [facility] Coordinator (RN) to assist per patient IV care needs in the home PRN.  Allergies as of 10/04/2017   No Known Allergies     Medication List    STOP taking these medications   doxycycline 100 MG capsule Commonly known as:  MONODOX   HYDROcodone-acetaminophen 5-325 MG tablet Commonly known as:  NORCO   ondansetron 4 MG tablet Commonly known as:  ZOFRAN     TAKE these medications   ampicillin-sulbactam IVPB Commonly known as:  UNASYN Inject 12 g into the vein daily at 8 pm for 26 days. As Continuous Infusion.   Indication:  Osteomyelitis Last Day of Therapy:  10/30/2017 Labs - Once weekly:  CBC/D and comprehensive BMP, ESR, CRP FAX weekly labs to [phone]   FREESTYLE LIBRE SENSOR SYSTEM Misc U UTD Q 10 DAYS.   ibuprofen 200 MG tablet Commonly known as:  ADVIL,MOTRIN Take 200 mg by mouth every 6 (six) hours as needed.   insulin NPH-regular Human (70-30) 100 UNIT/ML injection Commonly known as:  NOVOLIN 70/30 Inject 30 Units into the skin 2 (two) times daily with a meal. What changed:  See the new instructions.   lisinopril-hydrochlorothiazide 10-12.5  MG tablet Commonly known as:  PRINZIDE,ZESTORETIC Take 1 tablet by mouth daily   multivitamin with minerals Tabs tablet Take 1 tablet by mouth daily.   PARoxetine 20 MG tablet Commonly known as:  PAXIL Take 20 mg by mouth daily.   promethazine 25 MG tablet Commonly known as:  PHENERGAN Take 1 tablet (25 mg total) by mouth every 6 (six) hours as needed for nausea or vomiting.   promethazine 25 MG suppository Commonly known as:  PHENERGAN Place 1 suppository (25 mg total) rectally every 6 (six) hours as needed for nausea.   protein supplement shake Liqd Commonly known as:  PREMIER PROTEIN Take 325 mLs (11 oz total) by mouth 2 (two) times daily between meals.            Home Infusion Instuctions  (From admission, onward)        Start     Ordered   10/04/17 0000  Home infusion instructions [facility] May follow [place] [place] Dosing Protocol; May administer Cathflo as needed to maintain patency of vascular access device.; Flushing of vascular access device: per [facility] Protocol: 0.9% NaCl pre/post medica...    Question Answer Comment  Instructions May follow [place] Dosing Protocol   Instructions May administer Cathflo as needed to maintain patency of vascular access device.   Instructions Flushing of vascular access device: per [facility] Protocol: 0.9% NaCl pre/post medication administration and prn patency; Heparin 100 u/ml, 33m for implanted ports and Heparin 10u/ml, 517mfor all other central venous catheters.   Instructions May follow [facility] Anaphylaxis Protocol for First Dose Administration in the home: 0.9% NaCl at 25-50 ml/hr to maintain IV access for protocol meds. Epinephrine 0.3 ml IV/IM PRN and Benadryl 25-50 IV/IM PRN s/s of anaphylaxis.   Instructions [facility] Infusion Coordinator (RN) to assist per patient IV care needs in the home PRN.      10/04/17 1312        Diet and Activity recommendation: See Discharge Instructions above   Consults obtained  -ID, podiatry, physical therapy, diabetic nurse   Major procedures and Radiology Reports - PLEASE review detailed and final reports for all details, in brief -     Mr Cervical Spine Wo Contrast  Result Date: 09/26/2017 CLINICAL DATA:  Brain signal abnormality concerning for multiple sclerosis. Neck pain and stiffness. Right shoulder and upper arm pain. Numbness in the hands, worse  on the left. Bilateral arm weakness. EXAM: MRI CERVICAL SPINE WITHOUT CONTRAST TECHNIQUE: Multiplanar, multisequence MR imaging of the cervical spine was performed. No intravenous contrast was administered. COMPARISON:  Brain MRI 09/03/2017 FINDINGS: Alignment: Normal. 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 Minimal anterior depression of the C7 superior endplate appears chronic/ degenerative. No suspicious osseous lesion or significant marrow edema. Cord: Normal signal and morphology. Posterior Fossa, vertebral arteries, paraspinal tissues: Partially visualized T2 hyperintense lesion in the left cerebellum more fully evaluated on prior brain MRI. Preserved vertebral artery flow voids with the left being dominant. Disc levels: C2-3: Mild uncovertebral spurring and mild facet arthrosis without stenosis. C3-4: Shallow central disc protrusion and mild facet arthrosis without stenosis. C4-5: Disc bulging, left uncovertebral spurring, and mild facet arthrosis result in mild spinal stenosis and moderate left neural foraminal stenosis, potentially affecting the left C5 nerve root. C5-6: Mild disc bulging, mild uncovertebral spurring, and mild left facet arthrosis result in mild left neural foraminal stenosis without significant spinal stenosis pre C6-7: Mild disc bulging and mild right uncovertebral spurring result in mild right neural foraminal stenosis without spinal stenosis. C7-T1:  Congenital fusion.  No stenosis. IMPRESSION: 1. Normal appearance of the cervical spinal  cord without evidence of demyelinating disease. 2. Cervical spondylosis, greatest at C4-5 where there is mild spinal stenosis and moderate left neural foraminal stenosis. Electronically Signed   By: Logan Bores M.D.   On: 09/26/2017 13:22   Dg Fluoro Guided Loc Of Needle/cath Tip For Spinal Inject Lt  Result Date: 10/03/2017 CLINICAL DATA:  Evaluate for MS. EXAM: DIAGNOSTIC LUMBAR PUNCTURE UNDER FLUOROSCOPIC GUIDANCE FLUOROSCOPY TIME:  Fluoroscopy Time:  0 minutes 42 seconds Radiation Exposure Index (if provided by the fluoroscopic device): 26.8 mGy Number of Acquired Spot Images: 1 PROCEDURE: Informed consent was obtained from the patient prior to the procedure, including potential complications of headache, allergy, and pain. With the patient prone, the lower back was prepped with Betadine. 1% Lidocaine was used for local anesthesia. Lumbar puncture was performed at the L4-L5 level using a 22 gauge needle with return of initially clear CSF. After patient movement over some blood tinging within the CSF. Due to the patient's discomfort pressures were not obtained. The patient tolerated the procedure well and there were no apparent complications. IMPRESSION: Successful fluoroscopically directed lumbar puncture. CSF sent for laboratory evaluation. Electronically Signed   By: Marcello Moores  Register   On: 10/03/2017 08:25    Micro Results    Recent Results (from the past 240 hour(s))  Aerobic Culture (superficial specimen)     Status: None   Collection Time: 10/01/17  4:24 PM  Result Value Ref Range Status   Specimen Description   Final    FOOT LEFT Performed at [facility], [address], [place], [place] [address]    Special Requests   Final    Normal Performed at [facility], [address] [place], [place] [address]    Gram Stain   Final    RARE WBC PRESENT, PREDOMINANTLY PMN RARE GRAM POSITIVE COCCI IN CLUSTERS Performed at [facility], [place] [address],  [place], [place] [address]    Culture FEW STAPHYLOCOCCUS AUREUS  Final   Report Status 10/05/2017 FINAL  Final   Organism ID, Bacteria STAPHYLOCOCCUS AUREUS  Final      Susceptibility   Staphylococcus aureus - MIC*    CIPROFLOXACIN <=0.5 SENSITIVE Sensitive     ERYTHROMYCIN <=0.25 SENSITIVE Sensitive  GENTAMICIN <=0.5 SENSITIVE Sensitive     OXACILLIN 0.5 SENSITIVE Sensitive     TETRACYCLINE <=1 SENSITIVE Sensitive     VANCOMYCIN <=0.5 SENSITIVE Sensitive     TRIMETH/SULFA <=10 SENSITIVE Sensitive     CLINDAMYCIN <=0.25 SENSITIVE Sensitive     RIFAMPIN <=0.5 SENSITIVE Sensitive     Inducible Clindamycin NEGATIVE Sensitive     * FEW STAPHYLOCOCCUS AUREUS  MRSA PCR Screening     Status: None   Collection Time: 10/02/17  4:07 PM  Result Value Ref Range Status   MRSA by PCR NEGATIVE NEGATIVE Final    Comment:        The GeneXpert MRSA Assay (FDA approved for NASAL specimens only), is one component of a comprehensive MRSA colonization surveillance program. It is not intended to diagnose MRSA infection nor to guide or monitor treatment for MRSA infections. Performed at [facility], [address] [place], [place] [address]   Aerobic/Anaerobic Culture (surgical/deep wound)     Status: None (Preliminary result)   Collection Time: 10/02/17  6:31 PM  Result Value Ref Range Status   Specimen Description   Final    BONE Performed at [facility], [address], [place], [place] [address]    Special Requests   Final    NONE Performed at [facility], [address] [place], [place] [address]    Gram Stain   Final    RARE WBC PRESENT,BOTH PMN AND MONONUCLEAR RARE GRAM POSITIVE COCCI IN CLUSTERS Performed at [facility], [place] [address] [place], [place] [address]    Culture   Final    RARE STAPHYLOCOCCUS AUREUS SUSCEPTIBILITIES TO FOLLOW NO ANAEROBES ISOLATED; CULTURE IN PROGRESS FOR 5 DAYS    Report Status PENDING  Incomplete        Today   Subjective:   [name] today he is feeling well.  Stable for discharge.  Objective:   Blood pressure 121/75, pulse 81, temperature 98.1 F (36.7 C), temperature source Oral, resp. rate 19, height 5' 10"  (1.778 m), weight 119.9 kg (264 lb 6.4 oz), SpO2 99 %.  No intake or output data in the 24 hours ending 10/07/17 0959  Exam Awake Alert, Oriented x 3, No new F.N deficits, Normal affect [place].AT,PERRAL Supple Neck,No JVD, No cervical lymphadenopathy appriciated.  Symmetrical Chest wall movement, Good air movement bilaterally, CTAB RRR,No Gallops,Rubs or new Murmurs, No Parasternal Heave +ve B.Sounds, Abd Soft, Non tender, No organomegaly appriciated, No rebound -guarding or rigidity. No Cyanosis, Clubbing or edema, No new Rash or bruise. dressing present for the left foot. . Data Review   CBC w Diff:  Lab Results  Component Value Date   WBC 7.5 10/02/2017   HGB 12.0 (L) 10/02/2017   HGB 12.4 (L) 08/21/2013   HCT 35.0 (L) 10/02/2017   HCT 37.0 (L) 08/21/2013   PLT 306 10/02/2017   PLT 369 08/21/2013   LYMPHOPCT 18 10/01/2017   MONOPCT 7 10/01/2017   EOSPCT 2 10/01/2017   BASOPCT 1 10/01/2017    CMP:  Lab Results  Component Value Date   NA 132 (L) 10/03/2017   NA 131 (L) 08/22/2013   K 4.0 10/03/2017   K 4.6 08/22/2013   CL 100 (L) 10/03/2017   CL 99 08/22/2013   CO2 22 10/03/2017   CO2 28 08/22/2013   BUN 16 10/03/2017   BUN 31 (H) 08/22/2013   CREATININE 1.17 10/04/2017   CREATININE 1.50 (H) 08/23/2013   PROT 8.5 (H)  07/15/2017   PROT 9.5 (H) 08/21/2013   ALBUMIN 4.0 07/15/2017   ALBUMIN 3.2 (L) 08/21/2013   BILITOT 1.4 (H) 07/15/2017   BILITOT 0.7 08/21/2013   ALKPHOS 72 07/15/2017   ALKPHOS 83 08/21/2013   AST 16 07/15/2017   AST 21 08/21/2013   ALT 13 (L) 07/15/2017   ALT 29 08/21/2013  .   Total Time in preparing paper work, data evaluation and todays exam - 35 minutes  Epifanio Lesches M.D on 10/04/2017 at 9:59  AM    Note: This dictation was prepared with Dragon dictation along with smaller phrase technology. Any transcriptional errors that result from this process are unintentional.

## 2017-10-08 LAB — AEROBIC/ANAEROBIC CULTURE W GRAM STAIN (SURGICAL/DEEP WOUND)

## 2017-10-08 LAB — AEROBIC/ANAEROBIC CULTURE (SURGICAL/DEEP WOUND)

## 2018-09-10 ENCOUNTER — Ambulatory Visit: Payer: BLUE CROSS/BLUE SHIELD | Admitting: Neurology

## 2018-09-10 ENCOUNTER — Telehealth: Payer: Self-pay | Admitting: *Deleted

## 2018-09-10 ENCOUNTER — Other Ambulatory Visit: Payer: Self-pay

## 2018-09-10 ENCOUNTER — Encounter: Payer: Self-pay | Admitting: Neurology

## 2018-09-10 VITALS — BP 122/78 | HR 67 | Ht 71.0 in | Wt 299.5 lb

## 2018-09-10 DIAGNOSIS — Z79899 Other long term (current) drug therapy: Secondary | ICD-10-CM | POA: Diagnosis not present

## 2018-09-10 DIAGNOSIS — R260 Ataxic gait: Secondary | ICD-10-CM

## 2018-09-10 DIAGNOSIS — E1169 Type 2 diabetes mellitus with other specified complication: Secondary | ICD-10-CM

## 2018-09-10 DIAGNOSIS — G35 Multiple sclerosis: Secondary | ICD-10-CM | POA: Diagnosis not present

## 2018-09-10 DIAGNOSIS — E669 Obesity, unspecified: Secondary | ICD-10-CM

## 2018-09-10 NOTE — Patient Instructions (Addendum)
Once you get started you can get labwork once a month for 5-6 months at:  [address], [place] [address]    [address], [place] [address]

## 2018-09-10 NOTE — Telephone Encounter (Signed)
Faxed completed/signed [place] start form to [employer] [facility] at [phone]. Received fax confirmation.

## 2018-09-10 NOTE — Progress Notes (Signed)
[facility]  PATIENT: [name] DOB: 1970/12/02  REFERRING DOCTOR OR PCP:    Polo Riley (PCP); Theora Master (neurology) SOURCE: Patient, notes from Drs Malvin Johns and [facility], laboratory and imaging reports, MRI images personally reviewed.  _________________________________   HISTORICAL  CHIEF COMPLAINT:  Chief Complaint  Patient presents with  . New Patient (Initial Visit)    RM 13, alone. Paper referral from Morene Crocker, MD for MS consult/infusion. They tried to get him set up for Rituxin infusions at [facility] but was denied by insurance [facility]). They are trying to see if he would be approved for infusions here. 06/04/18 JCV 0.43. He was diagnosed around March/April with MS. Has not been on any DMT in the past. Has noticed general body weakness, fatigue. No family hx of MS.     HISTORY OF PRESENT ILLNESS:  I had the pleasure of seeing your patient, [name], at the MS center at [facility] for neurologic consultation regarding his recent diagnosis of multiple sclerosis and possible rituximab therapy.  On December 2018, he noted chest pain, slurred speech and trouble with his gait.    An MRi of the brain was consistent with MS.   He followed up with Dr. Malvin Johns who ordered an LP.   CSF showed oligoclonal bands (6 unique bands) and he was diagnosed with MS and referred to Dr. Ronalee Red at [facility] who confirmed the diagnosis.    He has not been on any DMTs so far and denies any exacerbations.    He denies any significant short term neurologic problems or episode before December 2018.    He was going to start Rituxan but BCBS would not cover this infusion at [facility] and he is referred for an opinion on DMT's and for infusion if needed.      February 2019 he had lab work to rule out hep B and tuberculosis.  Lab work was negative.Varicella-zoster antibodies are positive.  ANA was negative.  Vitamin D was low  03/05/2018 at 17.4.   He is JCV antibody Low positive (0.43 on 06/04/2018).    Currently, his gait is a little off balanced and he needs to hold on if he bends over to prevent toppling over.   He denies ataxia.   He denies weakness or spasticity in the limbs.   He denies numbness or dysesthesia.   Bladder function is fine and there is no nocturia.   Vision is fine (wears glasses).      He notes some fatigue that was worse over the summer with heat.    He sleeps well at night.     He has some anxiety more than depression.   Paroxetine has helped.    He notes mild cognitive issues such as coming up with the right word and he feels focus is mildly worse.       I personally reviewed the MRI of the brain dated 09/03/2017.    It shows multiple T2/flair hyperintense foci in the cerebellar hemispheres, left middle cerebellar peduncle, right pons and in the hemispheres in a pattern and configuration consistent with chronic demyelinating plaque associated with multiple sclerosis.  One focus in the right parieto-occipital region appears hyperintense on diffusion-weighted images and may be subacute   REVIEW OF SYSTEMS: Constitutional: No fevers, chills, sweats, or change in appetite.  He notes some fatigue, especially with heat Eyes: No visual changes, double vision, eye pain Ear, nose and throat: No hearing loss, ear  pain, nasal congestion, sore throat Cardiovascular: No chest pain, palpitations Respiratory: No shortness of breath at rest or with exertion.   No wheezes GastrointestinaI: No nausea, vomiting, diarrhea, abdominal pain, fecal incontinence Genitourinary: No dysuria, urinary retention or frequency.  No nocturia. Musculoskeletal: No neck pain, back pain Integumentary: No rash, pruritus.  He had a foot skin ulcer in the past Neurological: as above Psychiatric: No depression at this time.  No anxiety Endocrine: He has diabetes mellitus.  The hemoglobin A1c was 7.2 on insulin  09/03/2018. Hematologic/Lymphatic: No anemia, purpura, petechiae. Allergic/Immunologic: No itchy/runny eyes, nasal congestion, recent allergic reactions, rashes  ALLERGIES: No Known Allergies  HOME MEDICATIONS:  Current Outpatient Medications:  .  Continuous Blood Gluc Sensor (FREESTYLE LIBRE SENSOR SYSTEM) MISC, U UTD Q 10 DAYS., Disp: , Rfl: 11 .  gabapentin (NEURONTIN) 400 MG capsule, Take 200 mg by mouth 2 (two) times daily. 2-100mg  capsules in the morning and at night, Disp: , Rfl:  .  ibuprofen (ADVIL,MOTRIN) 200 MG tablet, Take 200 mg by mouth every 6 (six) hours as needed., Disp: , Rfl:  .  Insulin Detemir (LEVEMIR FLEXTOUCH) 100 UNIT/ML Pen, Inject 40 Units into the skin daily., Disp: , Rfl:  .  insulin NPH-regular Human (NOVOLIN 70/30) (70-30) 100 UNIT/ML injection, Inject 30 Units into the skin 2 (two) times daily with a meal., Disp: 10 mL, Rfl: 11 .  lisinopril-hydrochlorothiazide (PRINZIDE,ZESTORETIC) 10-12.5 MG tablet, Take 1 tablet by mouth daily, Disp: , Rfl: 1 .  Multiple Vitamin (MULTIVITAMIN WITH MINERALS) TABS tablet, Take 1 tablet by mouth daily., Disp: 30 tablet, Rfl: 0 .  PARoxetine (PAXIL) 20 MG tablet, Take 40 mg by mouth daily. , Disp: , Rfl:   PAST MEDICAL HISTORY: Past Medical History:  Diagnosis Date  . Anxiety   . Diabetes mellitus without complication (HCC)   . Hypertension     PAST SURGICAL HISTORY: Past Surgical History:  Procedure Laterality Date  . 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 CATARACT EXTRACTION, BILATERAL    . TOE AMPUTATION      FAMILY HISTORY: History reviewed. No pertinent family history.  SOCIAL HISTORY:  Social History   Socioeconomic History  . Marital status: Married    Spouse name: Dyllin Gulley  . Number of children: Not on file  . Years of education: 13  . Highest education level: Not on file   Occupational History  . Occupation: [employer]  . Financial resource strain: Not on file  . Food insecurity:    Worry: Not on file    Inability: Not on file  . Transportation needs:    Medical: Not on file    Non-medical: Not on file  Tobacco Use  . Smoking status: Never Smoker  . Smokeless tobacco: Never Used  Substance and Sexual Activity  . Alcohol use: No  . Drug use: No  . Sexual activity: Yes  Lifestyle  . Physical activity:    Days per week: Not on file    Minutes per session: Not on file  . Stress: Not on file  Relationships  . Social connections:    Talks on phone: Not on file    Gets together: Not on file    Attends religious service: Not on file    Active member of club or organization: Not on file    Attends meetings of clubs or organizations: Not  on file    Relationship status: Not on file  . Intimate partner violence:    Fear of current or ex partner: Not on file    Emotionally abused: Not on file    Physically abused: Not on file    Forced sexual activity: Not on file  Other Topics Concern  . Not on file  Social History Narrative   Lives with wife   Caffeine use: some soda     PHYSICAL EXAM  Vitals:   09/10/18 0849  BP: 122/78  Pulse: 67  Weight: 299 lb 8 oz (135.9 kg)  Height: 5\' 11"  (1.803 m)    Body mass index is 41.77 kg/m.   General: The patient is well-developed and well-nourished and in no acute distress  Eyes:  Funduscopic exam shows normal optic discs and retinal vessels.  Neck: The neck is supple, no carotid bruits are noted.  The neck is nontender.  Cardiovascular: The heart has a regular rate and rhythm with a normal S1 and S2. There were no murmurs, gallops or rubs. Lungs are clear to auscultation.  Skin: Extremities are without significant edema.  Musculoskeletal:  Back is nontender  Neurologic Exam  Mental status: The patient is alert and oriented x 3 at the time of the examination. The patient  has apparent normal recent and remote memory, with an apparently normal attention span and concentration ability.   Speech is normal.  Cranial nerves: Extraocular movements are full. Pupils are equal, round, and reactive to light and accomodation.  Color vision is symmetric.  Visual fields are full.  Facial symmetry is present. There is good facial sensation to soft touch bilaterally.Facial strength is normal.  Trapezius and sternocleidomastoid strength is normal. No dysarthria is noted.  The tongue is midline, and the patient has symmetric elevation of the soft palate. No obvious hearing deficits are noted.  Motor:  Muscle bulk is normal.   Tone is normal. Strength is  5 / 5 in all 4 extremities.   Sensory: Sensory testing is intact to pinprick, soft touch and vibration sensation in the arms but reduced vibration and touch in his toes.   Normal at the ankles  Coordination: Cerebellar testing reveals good finger-nose-finger and heel-to-shin bilaterally.  Gait and station: Station is normal.   Gait is mildly wide with slightly reduced stride and tandem gait is wide. Romberg is negative.   Reflexes: Deep tendon reflexes are symmetric and 3 at the arms, 2 at the knees and 1 at the ankles.  .   Plantar responses are flexor.    DIAGNOSTIC DATA (LABS, IMAGING, TESTING) - I reviewed patient records, labs, notes, testing and imaging myself where available.  Lab Results  Component Value Date   WBC 7.5 10/02/2017   HGB 12.0 (L) 10/02/2017   HCT 35.0 (L) 10/02/2017   MCV 85.6 10/02/2017   PLT 306 10/02/2017      Component Value Date/Time   NA 132 (L) 10/03/2017 0421   NA 131 (L) 08/22/2013 0454   K 4.0 10/03/2017 0421   K 4.6 08/22/2013 0454   CL 100 (L) 10/03/2017 0421   CL 99 08/22/2013 0454   CO2 22 10/03/2017 0421   CO2 28 08/22/2013 0454   GLUCOSE 221 (H) 10/03/2017 0421   GLUCOSE 131 (H) 08/22/2013 0454   BUN 16 10/03/2017 0421   BUN 31 (H) 08/22/2013 0454   CREATININE 1.17  10/04/2017 0513   CREATININE 1.50 (H) 08/23/2013 0415   CALCIUM 8.7 (L) 10/03/2017 0421  CALCIUM 9.3 08/22/2013 0454   PROT 8.5 (H) 07/15/2017 0819   PROT 9.5 (H) 08/21/2013 1153   ALBUMIN 4.0 07/15/2017 0819   ALBUMIN 3.2 (L) 08/21/2013 1153   AST 16 07/15/2017 0819   AST 21 08/21/2013 1153   ALT 13 (L) 07/15/2017 0819   ALT 29 08/21/2013 1153   ALKPHOS 72 07/15/2017 0819   ALKPHOS 83 08/21/2013 1153   BILITOT 1.4 (H) 07/15/2017 0819   BILITOT 0.7 08/21/2013 1153   GFRNONAA >60 10/04/2017 0513   GFRNONAA 56 (L) 08/23/2013 0415   GFRAA >60 10/04/2017 0513   GFRAA >60 08/23/2013 0415   No results found for: CHOL, HDL, LDLCALC, LDLDIRECT, TRIG, CHOLHDL Lab Results  Component Value Date   HGBA1C 9.1 (H) 10/02/2017      ASSESSMENT AND PLAN  Multiple sclerosis (HCC) - Plan: Comprehensive metabolic panel, CBC with Differential/Platelet, Hepatic function panel  Diabetes mellitus type 2 in obese [facility]) - Plan: Comprehensive metabolic panel, CBC with Differential/Platelet, Hepatic function panel  Ataxic gait  High risk medication use    In summary, [name] is a 47 year old man who was diagnosed with MS earlier this year after presenting with an exacerbation December 2018 causing slurred speech and poor gait.  MRI was consistent with MS.  Cerebrospinal fluid shows oligoclonal bands confirming the diagnosis.   He has not been on any disease modifying therapy yet and we had a further discussion about this.  Due to risk of exacerbation he needs to get on a disease modifying therapy.  He does have diabetes and had toe osteomyelitis past.  He would prefer to be on an oral agent rather than an IV agent.  I think it is reasonable to have him try Aubagio as it would be less likely to complicate any infections if they occur and would be easy for him to comply with.  He signed a service request form and the QuantiFERON-TB test was negative earlier this year.  I will check CBC with differential  and CMP today.  He understands that he needs to get monthly liver function panels for the next 6 months and I set this up at [facility] close to his home to make this easier.   If he has breakthrough activity then we will consider one of the IV medications as they are more efficacious.  He will return to see me in 4 months so we can determine if he is tolerating the medication well or sooner if he has any problems.    Thank you for asking me to see [name].  Please let me know if I can be of further assistance with him or other patients in the future.  [name] A. Epimenio Foot, MD, [facility] 09/10/2018, 10:16 AM Certified in Neurology, Clinical Neurophysiology, Sleep Medicine, Pain Medicine and Neuroimaging  [facility] [address] [place], [place] [address] [phone]

## 2018-09-11 LAB — COMPREHENSIVE METABOLIC PANEL
ALT: 20 IU/L (ref 0–44)
AST: 19 IU/L (ref 0–40)
Albumin/Globulin Ratio: 1.4 (ref 1.2–2.2)
Albumin: 4.4 g/dL (ref 3.5–5.5)
Alkaline Phosphatase: 85 IU/L (ref 39–117)
BUN/Creatinine Ratio: 21 — ABNORMAL HIGH (ref 9–20)
BUN: 23 mg/dL (ref 6–24)
Bilirubin Total: 0.4 mg/dL (ref 0.0–1.2)
CALCIUM: 9.6 mg/dL (ref 8.7–10.2)
CO2: 22 mmol/L (ref 20–29)
CREATININE: 1.07 mg/dL (ref 0.76–1.27)
Chloride: 99 mmol/L (ref 96–106)
GFR calc Af Amer: 94 mL/min/{1.73_m2} (ref >59)
GFR, EST NON AFRICAN AMERICAN: 82 mL/min/{1.73_m2} (ref >59)
GLUCOSE: 198 mg/dL — AB (ref 65–99)
Globulin, Total: 3.2 g/dL (ref 1.5–4.5)
POTASSIUM: 4.7 mmol/L (ref 3.5–5.2)
Sodium: 138 mmol/L (ref 134–144)
Total Protein: 7.6 g/dL (ref 6.0–8.5)

## 2018-09-11 LAB — CBC WITH DIFFERENTIAL/PLATELET
BASOS: 1 %
Basophils Absolute: 0.1 10*3/uL (ref 0.0–0.2)
EOS (ABSOLUTE): 0.2 10*3/uL (ref 0.0–0.4)
Eos: 3 %
Hematocrit: 45.5 % (ref 37.5–51.0)
Hemoglobin: 14.8 g/dL (ref 13.0–17.7)
IMMATURE GRANS (ABS): 0 10*3/uL (ref 0.0–0.1)
IMMATURE GRANULOCYTES: 1 %
LYMPHS: 31 %
Lymphocytes Absolute: 2.3 10*3/uL (ref 0.7–3.1)
MCH: 30.1 pg (ref 26.6–33.0)
MCHC: 32.5 g/dL (ref 31.5–35.7)
MCV: 93 fL (ref 79–97)
Monocytes Absolute: 0.5 10*3/uL (ref 0.1–0.9)
Monocytes: 6 %
NEUTROS PCT: 58 %
Neutrophils Absolute: 4.2 10*3/uL (ref 1.4–7.0)
PLATELETS: 240 10*3/uL (ref 150–450)
RBC: 4.91 x10E6/uL (ref 4.14–5.80)
RDW: 13 % (ref 12.3–15.4)
WBC: 7.3 10*3/uL (ref 3.4–10.8)

## 2018-09-12 ENCOUNTER — Telehealth: Payer: Self-pay | Admitting: *Deleted

## 2018-09-12 NOTE — Telephone Encounter (Signed)
PA for Aubagio 14mg  completed and faxed to [facility]. Dx: RRMS (G35). Tried and failed meds: None. Recent MS dx. This will be his first dmt/fim

## 2018-09-19 NOTE — Telephone Encounter (Signed)
Aubagio PA approved by Ezequiel Essex [phone]) through 09/23/2038.  Ref#[id number].

## 2018-09-25 ENCOUNTER — Telehealth: Payer: Self-pay | Admitting: *Deleted

## 2018-09-25 NOTE — Telephone Encounter (Signed)
Called, LVM for pt to call office back to let us know if he started on Aubagio and what date. He will need to have monthly labwork for the next 5 months after starting medication. Gave [facility] phone number

## 2018-09-29 NOTE — Telephone Encounter (Signed)
Called, LVM for pt again to call office. 

## 2018-09-30 ENCOUNTER — Encounter: Payer: Self-pay | Admitting: *Deleted

## 2018-09-30 NOTE — Telephone Encounter (Signed)
Sent pt letter since unable to reach by phone. 

## 2018-09-30 NOTE — Telephone Encounter (Signed)
Called, LVM for pt again to call office. 

## 2018-10-08 ENCOUNTER — Other Ambulatory Visit: Payer: Self-pay | Admitting: *Deleted

## 2018-10-08 DIAGNOSIS — Z79899 Other long term (current) drug therapy: Secondary | ICD-10-CM

## 2018-10-08 DIAGNOSIS — G35 Multiple sclerosis: Secondary | ICD-10-CM

## 2018-10-08 NOTE — Telephone Encounter (Signed)
I was able to reach the patient this afternoon.  He started Aubagio on 09/29/2018.  He is aware that he will need to come in monthly for lab work over the next 5 months.  Since he has been difficult to reach, I scheduled him for lab work on 10/29/2018 at 11am.  In the future, he said if we are unable to reach him then we can also contact his wife, Steward Drone (on [place]).  Her number is [phone].  He is unsure of which mail order sent him the Aubagio. I requested him to bring the medication bottle and package inserts with him to his lab appointment.  We need to update his pharmacy information in Epic.

## 2018-10-08 NOTE — Telephone Encounter (Signed)
Pt has returned the call to RN Emma, he is asking for a call back °

## 2018-10-08 NOTE — Telephone Encounter (Signed)
LMTC.  When he calls back, please let Marinus Eicher and Kara Mead know and one of [place] will take his call. He is very difficult to get in touch with./fim

## 2018-10-29 ENCOUNTER — Other Ambulatory Visit (INDEPENDENT_AMBULATORY_CARE_PROVIDER_SITE_OTHER): Payer: Self-pay

## 2018-10-29 DIAGNOSIS — Z79899 Other long term (current) drug therapy: Secondary | ICD-10-CM

## 2018-10-29 DIAGNOSIS — G35 Multiple sclerosis: Secondary | ICD-10-CM

## 2018-10-29 DIAGNOSIS — Z0289 Encounter for other administrative examinations: Secondary | ICD-10-CM

## 2018-10-30 ENCOUNTER — Telehealth: Payer: Self-pay | Admitting: *Deleted

## 2018-10-30 LAB — HEPATIC FUNCTION PANEL
ALBUMIN: 4.3 g/dL (ref 4.0–5.0)
ALK PHOS: 91 IU/L (ref 39–117)
ALT: 23 IU/L (ref 0–44)
AST: 22 IU/L (ref 0–40)
Bilirubin Total: 0.4 mg/dL (ref 0.0–1.2)
Bilirubin, Direct: 0.13 mg/dL (ref 0.00–0.40)
TOTAL PROTEIN: 7.7 g/dL (ref 6.0–8.5)

## 2018-10-30 NOTE — Telephone Encounter (Signed)
-----   Message from Richard A Sater, MD sent at 10/30/2018  9:15 AM EST ----- Liver tests are good 

## 2018-10-30 NOTE — Telephone Encounter (Signed)
-----   Message from Asa Lente, MD sent at 10/30/2018  9:18 AM EST ----- Please let the patient know that the lab work is fine.

## 2018-10-30 NOTE — Telephone Encounter (Signed)
Error, duplicate

## 2018-10-30 NOTE — Telephone Encounter (Signed)
Spoke with Meredith and advised lft's are ok; will check again next month. He verbalized understanding of same/fim  

## 2018-10-30 NOTE — Telephone Encounter (Signed)
Spoke with [name] and advised lft's are ok; will check again next month. He verbalized understanding of same/fim

## 2018-10-30 NOTE — Telephone Encounter (Signed)
-----   Message from Asa Lente, MD sent at 10/30/2018  9:15 AM EST ----- Liver tests are good

## 2018-11-25 ENCOUNTER — Telehealth: Payer: Self-pay | Admitting: *Deleted

## 2018-11-25 ENCOUNTER — Other Ambulatory Visit: Payer: Self-pay | Admitting: *Deleted

## 2018-11-25 DIAGNOSIS — G35 Multiple sclerosis: Secondary | ICD-10-CM

## 2018-11-25 DIAGNOSIS — Z79899 Other long term (current) drug therapy: Secondary | ICD-10-CM

## 2018-11-25 NOTE — Telephone Encounter (Signed)
Called, LVM on home number reminding pt to come in this week for repeat lab work. Needing to check liver function test. Reminded him we close early on Friday's at 12pm. I placed future order in epic.

## 2018-11-26 NOTE — Telephone Encounter (Signed)
Pt returned RN's call. Pt is having labs at PCP office next week and wants to have liver function test there. Per Emma,RN that is ok but he must tell them he needs hepatic function panel and the results needs to be faxed to [phone]. Pt was driving and will call back when he gets home to write this information down.

## 2018-11-26 NOTE — Telephone Encounter (Signed)
Noted--when he calls please provide info below

## 2018-12-03 NOTE — Telephone Encounter (Signed)
Called and spoke with patient. Relayed message from Angie. He just got off the phone with insurance and they will not let him change at this point. He has to find providers in network. I offered that once he looks over list of in-network that if he would like recommendation from Dr. Epimenio Foot, we are happy to provide this.   He is going to call back and see if [facility] is in network so he can at least get labs completed. He will call back and let me know.

## 2018-12-03 NOTE — Telephone Encounter (Signed)
Faxed order to [facility]/[address] At  [phone]. Received fax confirmation.

## 2018-12-03 NOTE — Telephone Encounter (Signed)
Pt called back. He wants orders sent to [facility] near him. Not sure if its in network yet but he will pay OOP for this if needed. Advised I will send orders over today for him. He verbalized understanding and appreciation.  I spoke with Dr. Epimenio Foot and made him aware of situation. States if [facility] is in network, this would be the best for him to transfer his care to.

## 2018-12-03 NOTE — Telephone Encounter (Signed)
Talked with Angie in billing. [employer] [facility] out of network. They can still file but it will cost more than an in network provider

## 2018-12-03 NOTE — Telephone Encounter (Signed)
I called and spoke with wife (on [place]). He has had a change of insurance from [facility] to [employer]. PCP no longer in network and was unable to be seen yesterday. He did not have labwork completed. She is not sure if we are in network or not. I printed insurance card and advised I will check with billing department to see and then call husband for him to call me back.  If [facility] in network, she would like orders to go to this location:  Address: [address], [place], [place] [address] Phone: [phone]

## 2018-12-11 NOTE — Telephone Encounter (Signed)
Called, LVM for pt to call and provide me an update on everything. Wondering if he got his labs completed and if he was able to find in network provider.

## 2018-12-11 NOTE — Telephone Encounter (Signed)
hasnt gotten labs nor provider yet

## 2018-12-11 NOTE — Telephone Encounter (Signed)
I called pt back. Relayed message below. He will go get lab work completed. He will let me know as soon as he completes this.

## 2018-12-11 NOTE — Telephone Encounter (Signed)
I spoke with Dr. Epimenio Foot- he is aware I have been working with pt on this since 11/25/18. I will again express importance of pt getting labs and finding in network provider asap

## 2019-01-14 ENCOUNTER — Ambulatory Visit: Payer: BLUE CROSS/BLUE SHIELD | Admitting: Neurology

## 2019-04-19 IMAGING — MR MR CERVICAL SPINE W/O CM
5 series · 33 of 48 positions shown · non-contrast
Comparison: Brain MRI 09/03/2017

CLINICAL DATA: Brain signal abnormality concerning for multiple
sclerosis. Neck pain and stiffness. Right shoulder and upper arm
pain. Numbness in the hands, worse on the left. Bilateral arm
weakness.

EXAM:
MRI CERVICAL SPINE WITHOUT CONTRAST
TECHNIQUE: Multiplanar, multisequence MR imaging of the cervical spine was
performed. No intravenous contrast was administered.

[Series 2: T1 · sagittal · 3.0mm · 0.70mm/px · 7 of 13 slices shown]
[im 1/13]
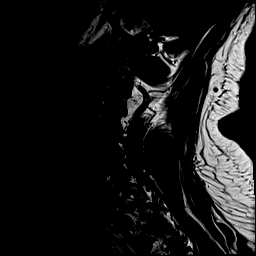
[im 3/13]
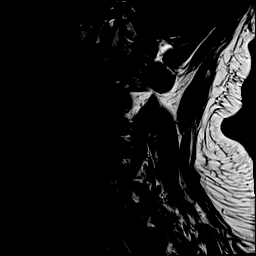
[im 5/13]
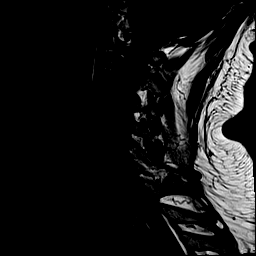
[im 7/13]
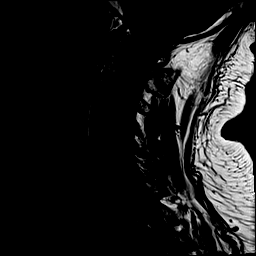
[im 9/13]
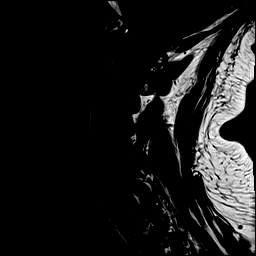
[im 11/13]
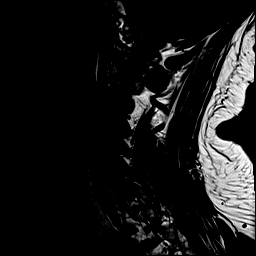
[im 13/13]
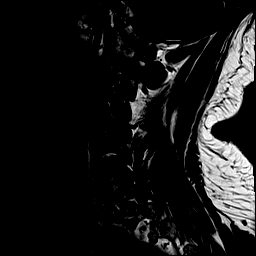

[Series 3: T2 · sagittal · 3.0mm · 0.56mm/px · 7 of 13 slices shown (1 of 2)]
[im 1/13]
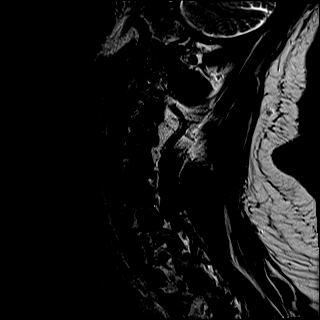
[im 3/13]
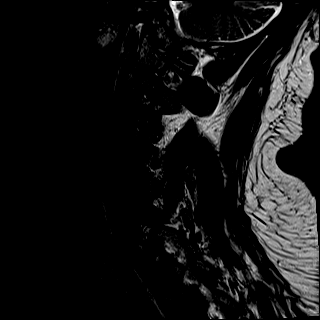
[im 5/13]
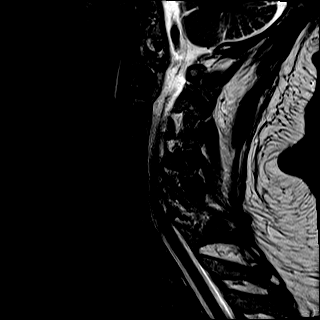
[im 7/13]
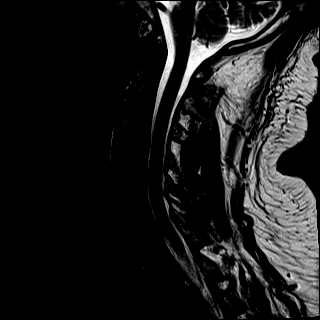
[im 9/13]
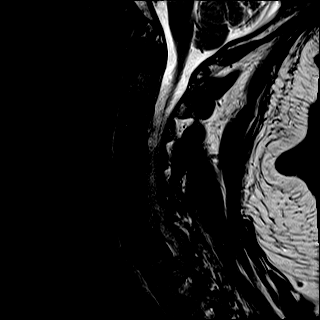
[im 11/13]
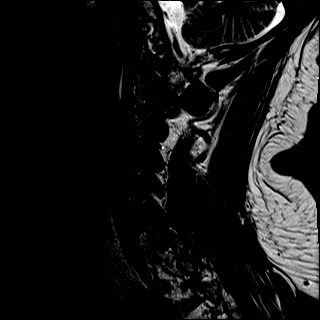
[im 13/13]
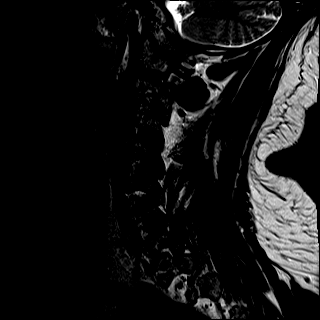

[Series 4: STIR · sagittal · 3.0mm · 0.35mm/px · 6 of 13 slices shown]
[im 1/13]
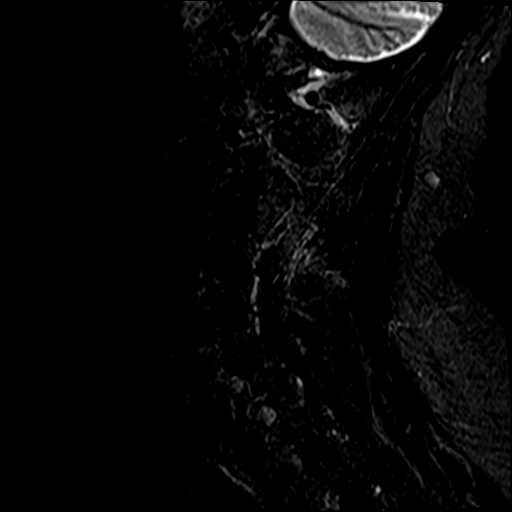
[im 3/13]
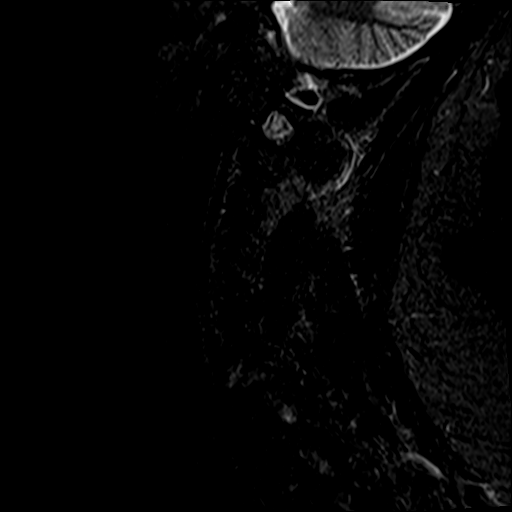
[im 5/13]
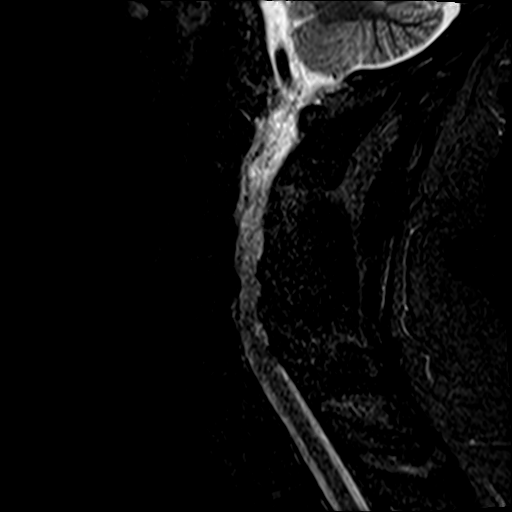
[im 8/13]
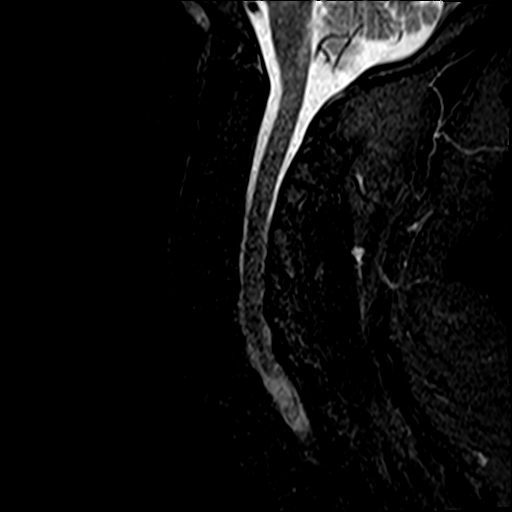
[im 10/13]
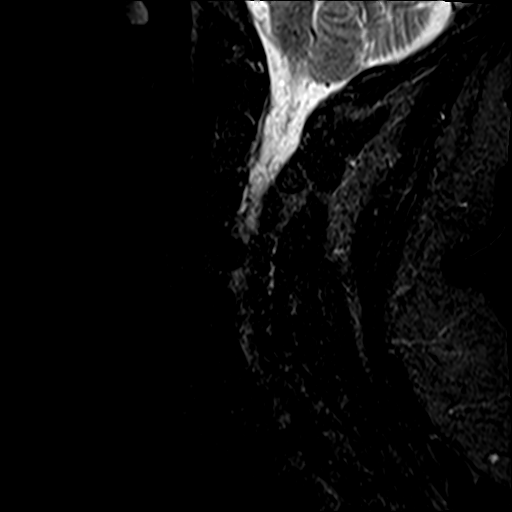
[im 13/13]
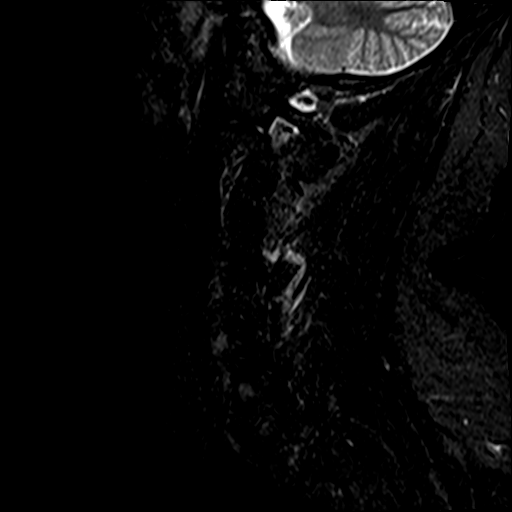

[Series 5: T2 · axial · 3.0mm · 0.62mm/px · z∈[-119,-14]mm · 8 of 29 slices shown (2 of 2)]
[im 1/29]
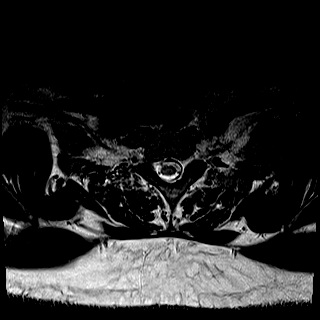
[im 5/29]
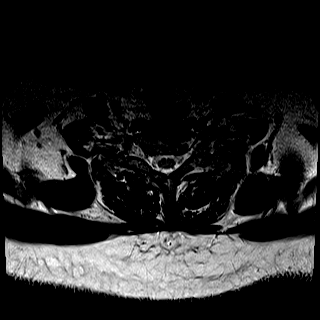
[im 9/29]
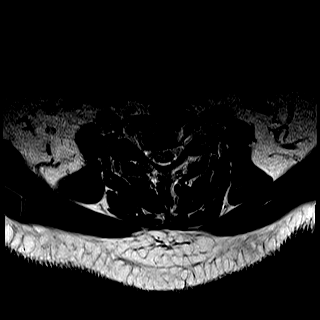
[im 13/29]
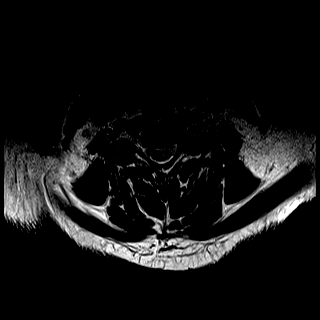
[im 16/29]
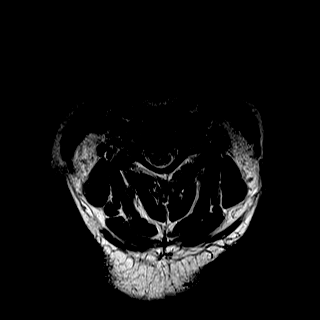
[im 20/29]
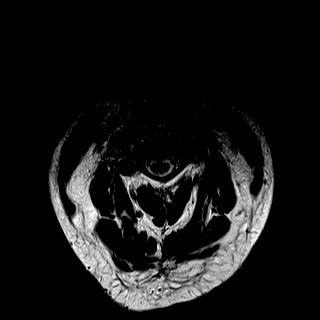
[im 24/29]
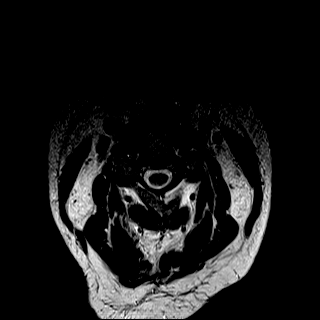
[im 29/29]
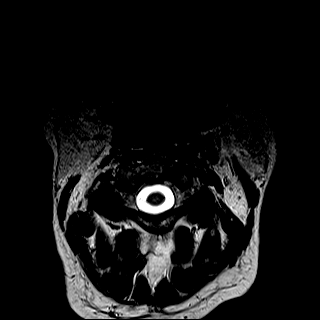

[Series 6: mpgr ax · axial · 3.0mm · 0.39mm/px · z∈[-112,-56]mm · 5 of 29 slices shown]
[im 1/29]
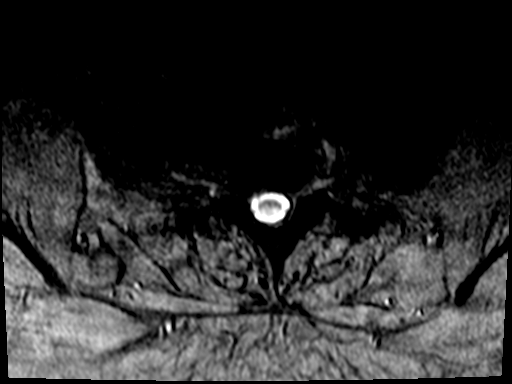
[im 5/29]
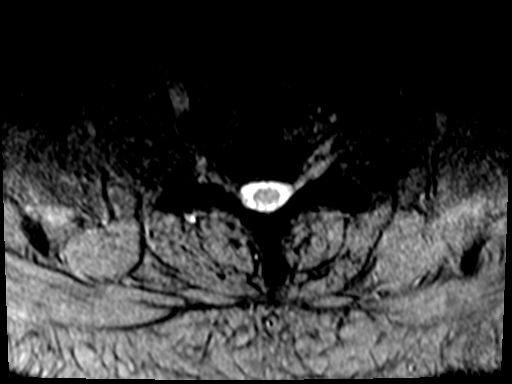
[im 9/29]
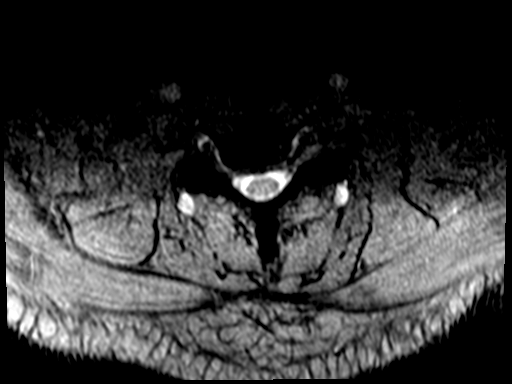
[im 13/29]
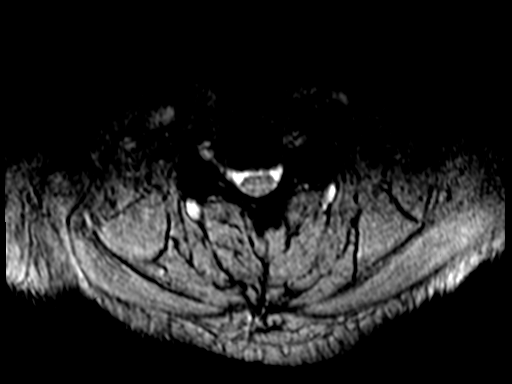
[im 16/29]
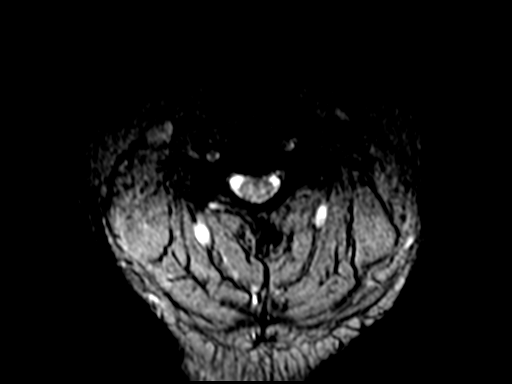

[33 of 48 positions shown; findings below may reference images not displayed]

FINDINGS: Alignment: Normal.

Vertebrae: Ankylosis between the vertebral bodies and posterior
elements at C7-T1, likely congenital. Minimal anterior depression of
the C7 superior endplate appears chronic/ degenerative. No
suspicious osseous lesion or significant marrow edema.

Cord: Normal signal and morphology.

Posterior Fossa, vertebral arteries, paraspinal tissues: Partially
visualized T2 hyperintense lesion in the left cerebellum more fully
evaluated on prior brain MRI. Preserved vertebral artery flow voids
with the left being dominant.

Disc levels:

C2-3: Mild uncovertebral spurring and mild facet arthrosis without
stenosis.

C3-4: Shallow central disc protrusion and mild facet arthrosis
without stenosis.

C4-5: Disc bulging, left uncovertebral spurring, and mild facet
arthrosis result in mild spinal stenosis and moderate left neural
foraminal stenosis, potentially affecting the left C5 nerve root.

C5-6: Mild disc bulging, mild uncovertebral spurring, and mild left
facet arthrosis result in mild left neural foraminal stenosis
without significant spinal stenosis pre

C6-7: Mild disc bulging and mild right uncovertebral spurring result
in mild right neural foraminal stenosis without spinal stenosis.

C7-T1:  Congenital fusion.  No stenosis.
IMPRESSION: 1. Normal appearance of the cervical spinal cord without evidence of
demyelinating disease.
2. Cervical spondylosis, greatest at C4-5 where there is mild spinal
stenosis and moderate left neural foraminal stenosis.

## 2019-09-02 ENCOUNTER — Telehealth: Payer: Self-pay | Admitting: Neurology

## 2019-09-02 DIAGNOSIS — G35 Multiple sclerosis: Secondary | ICD-10-CM

## 2019-09-02 NOTE — Telephone Encounter (Signed)
Pt has called to inform that he will be about a week short re: having enough of Teriflunomide (AUBAGIO) 14 MG TABS to last him to the appointment.  Pt is asking for a weeks worth to be called into  [place] [phone]

## 2019-09-02 NOTE — Telephone Encounter (Signed)
Called pt back. He has no more refills left on Aubagio. He has appt for 09/23/19 at 11:30 with Dr. Felecia Shelling. I was going to offer sooner appt with Dr. Felecia Shelling or AL,NP but none available currently. He is going to call me back tomorrow with name of specialty pharmacy he uses. He was not home at time of call to check.

## 2019-09-03 MED ORDER — AUBAGIO 14 MG PO TABS: ORAL_TABLET | ORAL | 1 refills | Status: DC

## 2019-09-03 NOTE — Telephone Encounter (Signed)
Called, LVM for pt to call back with name of specialty pharmacy

## 2019-09-03 NOTE — Telephone Encounter (Signed)
Pt called back with the name of [facility] [phone]

## 2019-09-03 NOTE — Telephone Encounter (Signed)
Escribed refill as requested. Pt has f/u 09/23/19.

## 2019-09-03 NOTE — Addendum Note (Signed)
Addended by: Hope Pigeon on: 09/03/2019 04:06 PM   Modules accepted: Orders

## 2019-09-09 NOTE — Telephone Encounter (Signed)
Pt has called to inform that he has not received the refill yet.  Pt was advised to contact [place], [place]    [phone] (Phone) this is a FYI, no call back requested

## 2019-09-23 ENCOUNTER — Telehealth: Payer: Self-pay

## 2019-09-23 ENCOUNTER — Encounter: Payer: Self-pay | Admitting: Neurology

## 2019-09-23 ENCOUNTER — Other Ambulatory Visit: Payer: Self-pay

## 2019-09-23 ENCOUNTER — Ambulatory Visit: Payer: BLUE CROSS/BLUE SHIELD | Admitting: Neurology

## 2019-09-23 VITALS — BP 137/77 | HR 81 | Temp 97.5°F | Ht 71.0 in | Wt 293.5 lb

## 2019-09-23 DIAGNOSIS — E559 Vitamin D deficiency, unspecified: Secondary | ICD-10-CM

## 2019-09-23 DIAGNOSIS — R260 Ataxic gait: Secondary | ICD-10-CM | POA: Diagnosis not present

## 2019-09-23 DIAGNOSIS — E669 Obesity, unspecified: Secondary | ICD-10-CM

## 2019-09-23 DIAGNOSIS — Z79899 Other long term (current) drug therapy: Secondary | ICD-10-CM | POA: Diagnosis not present

## 2019-09-23 DIAGNOSIS — G35 Multiple sclerosis: Secondary | ICD-10-CM

## 2019-09-23 DIAGNOSIS — E1169 Type 2 diabetes mellitus with other specified complication: Secondary | ICD-10-CM

## 2019-09-23 NOTE — Progress Notes (Signed)
[facility]  PATIENT: [name] DOB: June 26, 1971  REFERRING DOCTOR OR PCP:    Melinda Crutch (PCP); Gurney Maxin (neurology) SOURCE: Patient, notes from Drs Melrose Nakayama and [facility], laboratory and imaging reports, MRI images personally reviewed.  _________________________________   HISTORICAL  CHIEF COMPLAINT:  Chief Complaint  Patient presents with  . Follow-up    RM12, alone. Patient states that his balance is worsening. Had a fall approx 2 weeks ago.    HISTORY OF PRESENT ILLNESS:   [name] is a 48 y.o.  man with relapsing remitting multiple sclerosis    Update 09/23/2019:  After the last visit in 08/2018, he started Aubagio and tolerates it well.   He feels his balance is a little worse.   He needs to be careful with his walking to avoid stumbling.  He has a couple falls but no injuries.   Balance is more of an issue than strength and he feels legs are symmetric.     He denies arm numbness but notes some in both legs.      Bladder function is fine.   Vision is fine with glasses.      He notes fatigue, this is both physical and cognitive.   He notes memory is worse and he has word finding issues at times.   Reduced focus.   He notes some anxiety > depression.   He sleeps well most night.   He snores but does not have pauses or gasps/snorts.   He falls asleep at times watching TV in the evenings but never before supper.    He has never had a sleep study.  EPWORTH SLEEPINESS SCALE  On a scale of 0 - 3 what is the chance of dozing:  Sitting and Reading:  0 Watching TV:   3 Sitting inactive in a public place: 0 Passenger in car for one hour: 0 Lying down to rest in the afternoon: 0 Sitting and talking to someone: 0 Sitting quietly after lunch:  0 In a car, stopped in traffic:  0  Total (out of 24):   3/24      From initial consult 09/10/2018: On December 2018, he noted chest pain, slurred speech and trouble with his gait.    An MRi of the  brain was consistent with MS.   He followed up with Dr. Melrose Nakayama who ordered an LP.   CSF showed oligoclonal bands (6 unique bands) and he was diagnosed with MS and referred to Dr. Nigel Bridgeman at [facility] who confirmed the diagnosis.    He has not been on any DMTs so far and denies any exacerbations.    He denies any significant short term neurologic problems or episode before December 2018.    He was going to start Rituxan but BCBS would not cover this infusion at [facility] and he is referred for an opinion on DMT's and for infusion if needed.      February 2019 he had lab work to rule out hep B and tuberculosis.  Lab work was negative.Varicella-zoster antibodies are positive.  ANA was negative.  Vitamin D was low 03/05/2018 at 17.4.   He is JCV antibody Low positive (0.43 on 06/04/2018).    Currently, his gait is a little off balanced and he needs to hold on if he bends over to prevent toppling over.   He denies ataxia.   He denies weakness or spasticity in the limbs.   He denies numbness or dysesthesia.   Bladder function  is fine and there is no nocturia.   Vision is fine (wears glasses).      He notes some fatigue that was worse over the summer with heat.    He sleeps well at night.     He has some anxiety more than depression.   Paroxetine has helped.    He notes mild cognitive issues such as coming up with the right word and he feels focus is mildly worse.       I personally reviewed the MRI of the brain dated 09/03/2017.    It shows multiple T2/flair hyperintense foci in the cerebellar hemispheres, left middle cerebellar peduncle, right pons and in the hemispheres in a pattern and configuration consistent with chronic demyelinating plaque associated with multiple sclerosis.  One focus in the right parieto-occipital region appears hyperintense on diffusion-weighted images and may be subacute   REVIEW OF SYSTEMS: Constitutional: No fevers, chills, sweats, or change in appetite.  He notes some fatigue,  especially with heat Eyes: No visual changes, double vision, eye pain Ear, nose and throat: No hearing loss, ear pain, nasal congestion, sore throat Cardiovascular: No chest pain, palpitations Respiratory: No shortness of breath at rest or with exertion.   No wheezes GastrointestinaI: No nausea, vomiting, diarrhea, abdominal pain, fecal incontinence Genitourinary: No dysuria, urinary retention or frequency.  No nocturia. Musculoskeletal: No neck pain, back pain Integumentary: No rash, pruritus.  He had a foot skin ulcer in the past Neurological: as above Psychiatric: No depression at this time.  No anxiety Endocrine: He has diabetes mellitus.  The hemoglobin A1c was 7.2 on insulin 09/03/2018. Hematologic/Lymphatic: No anemia, purpura, petechiae. Allergic/Immunologic: No itchy/runny eyes, nasal congestion, recent allergic reactions, rashes  ALLERGIES: No Known Allergies  HOME MEDICATIONS:  Current Outpatient Medications:  .  Continuous Blood Gluc Sensor (FREESTYLE LIBRE SENSOR SYSTEM) MISC, U UTD Q 10 DAYS., Disp: , Rfl: 11 .  gabapentin (NEURONTIN) 400 MG capsule, Take 200 mg by mouth 2 (two) times daily. 2-100mg  capsules in the morning and at night, Disp: , Rfl:  .  ibuprofen (ADVIL,MOTRIN) 200 MG tablet, Take 200 mg by mouth every 6 (six) hours as needed., Disp: , Rfl:  .  Insulin Detemir (LEVEMIR FLEXTOUCH) 100 UNIT/ML Pen, Inject 40 Units into the skin daily., Disp: , Rfl:  .  insulin NPH-regular Human (NOVOLIN 70/30) (70-30) 100 UNIT/ML injection, Inject 30 Units into the skin 2 (two) times daily with a meal., Disp: 10 mL, Rfl: 11 .  lisinopril-hydrochlorothiazide (PRINZIDE,ZESTORETIC) 10-12.5 MG tablet, Take 1 tablet by mouth daily, Disp: , Rfl: 1 .  Multiple Vitamin (MULTIVITAMIN WITH MINERALS) TABS tablet, Take 1 tablet by mouth daily., Disp: 30 tablet, Rfl: 0 .  PARoxetine (PAXIL) 20 MG tablet, Take 40 mg by mouth daily. , Disp: , Rfl:  .  Teriflunomide (AUBAGIO) 14 MG TABS,  Take 1 tablet by mouth daily, Disp: 30 tablet, Rfl: 1  PAST MEDICAL HISTORY: Past Medical History:  Diagnosis Date  . Anxiety   . Diabetes mellitus without complication (HCC)   . Hypertension     PAST SURGICAL HISTORY: Past Surgical History:  Procedure Laterality Date  . AMPUTATION Left 10/02/2017   Procedure: AMPUTATION RAY-LEFT 5TH;  Surgeon: Linus Galasline, Todd, DPM;  Location: [facility] ORS;  Service: Podiatry;  Laterality: Left;  . CATARACT EXTRACTION, BILATERAL    . TOE AMPUTATION      FAMILY HISTORY: No family history on file.  SOCIAL HISTORY:  Social History   Socioeconomic History  . Marital status: Married  Spouse name: Mikale Silversmith  . Number of children: Not on file  . Years of education: 89  . Highest education level: Not on file  Occupational History  . Occupation: [employer]  Tobacco Use  . Smoking status: Never Smoker  . Smokeless tobacco: Never Used  Substance and Sexual Activity  . Alcohol use: No  . Drug use: No  . Sexual activity: Yes  Other Topics Concern  . Not on file  Social History Narrative   Lives with wife   Caffeine use: some soda   Social Determinants of Health   Financial Resource Strain:   . Difficulty of Paying Living Expenses: Not on file  Food Insecurity:   . Worried About Programme researcher, broadcasting/film/video in the Last Year: Not on file  . Ran Out of Food in the Last Year: Not on file  Transportation Needs:   . Lack of Transportation (Medical): Not on file  . Lack of Transportation (Non-Medical): Not on file  Physical Activity:   . Days of Exercise per Week: Not on file  . Minutes of Exercise per Session: Not on file  Stress:   . Feeling of Stress : Not on file  Social Connections:   . Frequency of Communication with Friends and Family: Not on file  . Frequency of Social Gatherings with Friends and Family: Not on file  . Attends Religious Services: Not on file  . Active Member of Clubs or Organizations: Not on file  . Attends Tax inspector Meetings: Not on file  . Marital Status: Not on file  Intimate Partner Violence:   . Fear of Current or Ex-Partner: Not on file  . Emotionally Abused: Not on file  . Physically Abused: Not on file  . Sexually Abused: Not on file     PHYSICAL EXAM  Vitals:   09/23/19 1116  BP: 137/77  Pulse: 81  Temp: (!) 97.5 F (36.4 C)  Weight: 293 lb 8 oz (133.1 kg)  Height: 5\' 11"  (1.803 m)    Body mass index is 40.93 kg/m.   General: The patient is well-developed and well-nourished and in no acute distress  Skin: Extremities are without significant edema.  Neurologic Exam  Mental status: The patient is alert and oriented x 3 at the time of the examination. The patient has apparent normal recent and remote memory, with an apparently normal attention span and concentration ability.   Speech is normal.  Cranial nerves: Extraocular movements are full. Pupils are equal, round, and reactive to light and accomodation.  Color vision is symmetric.  Visual fields are full.  Facial symmetry is present. There is good facial sensation to soft touch bilaterally.Facial strength is normal.  Trapezius and sternocleidomastoid strength is normal. No dysarthria is noted.   No obvious hearing deficits are noted.  Motor:  Muscle bulk is normal.   Tone is normal. Strength is  5 / 5 in all 4 extremities.   Sensory: Sensory testing is intact to pinprick, soft touch and vibration sensation in the arms but reduced vibration and touch in his toes.   Normal at the ankles  Coordination: Finger-nose-finger and heel-to-shin is performed well. Gait and station: Station is normal.   Gait is mildly wide with reduced stride and tandem gait is wide. Romberg is negative.   Reflexes: Deep tendon reflexes are symmetric and 3 at the arms, 2 at the knees and 1 at the ankles.       DIAGNOSTIC DATA (LABS, IMAGING, TESTING) -  I reviewed patient records, labs, notes, testing and imaging myself where available.   Lab Results  Component Value Date   WBC 7.3 09/10/2018   HGB 14.8 09/10/2018   HCT 45.5 09/10/2018   MCV 93 09/10/2018   PLT 240 09/10/2018      Component Value Date/Time   NA 138 09/10/2018 0959   NA 131 (L) 08/22/2013 0454   K 4.7 09/10/2018 0959   K 4.6 08/22/2013 0454   CL 99 09/10/2018 0959   CL 99 08/22/2013 0454   CO2 22 09/10/2018 0959   CO2 28 08/22/2013 0454   GLUCOSE 198 (H) 09/10/2018 0959   GLUCOSE 221 (H) 10/03/2017 0421   GLUCOSE 131 (H) 08/22/2013 0454   BUN 23 09/10/2018 0959   BUN 31 (H) 08/22/2013 0454   CREATININE 1.07 09/10/2018 0959   CREATININE 1.50 (H) 08/23/2013 0415   CALCIUM 9.6 09/10/2018 0959   CALCIUM 9.3 08/22/2013 0454   PROT 7.7 10/29/2018 1106   PROT 9.5 (H) 08/21/2013 1153   ALBUMIN 4.3 10/29/2018 1106   ALBUMIN 3.2 (L) 08/21/2013 1153   AST 22 10/29/2018 1106   AST 21 08/21/2013 1153   ALT 23 10/29/2018 1106   ALT 29 08/21/2013 1153   ALKPHOS 91 10/29/2018 1106   ALKPHOS 83 08/21/2013 1153   BILITOT 0.4 10/29/2018 1106   BILITOT 0.7 08/21/2013 1153   GFRNONAA 82 09/10/2018 0959   GFRNONAA 56 (L) 08/23/2013 0415   GFRAA 94 09/10/2018 0959   GFRAA >60 08/23/2013 0415   No results found for: CHOL, HDL, LDLCALC, LDLDIRECT, TRIG, CHOLHDL Lab Results  Component Value Date   HGBA1C 9.1 (H) 10/02/2017      ASSESSMENT AND PLAN  Multiple sclerosis (HCC) - Plan: MR BRAIN W WO CONTRAST, MR CERVICAL SPINE W WO CONTRAST, Hepatic Function Panel, CBC with Differential/Platelets, Stratify JCV Antibody Test ([facility])  High risk medication use - Plan: Hepatic Function Panel, CBC with Differential/Platelets, Stratify JCV Antibody Test ([facility])  Ataxic gait  Vitamin D deficiency - Plan: Vitamin D, 25-hydroxy  Diabetes mellitus type 2 in obese (HCC)  1.  For MS, continue Aubagio.  We will check some lab work.  Additionally we need to check an MRI of the brain and cervical spine to determine if there is any subclinical activity.  If present,  consider a switch to a more efficacious disease modifying therapy. 2.   Stay active and exercise as tolerated.  3.  We discussed CDC guidance for COVID-19. 4.  Return in 6 months or sooner if there are new or worsening neurologic symptoms.   A. Epimenio Foot, MD, [facility] 09/23/2019, 6:16 PM Certified in Neurology, Clinical Neurophysiology, Sleep Medicine, Pain Medicine and Neuroimaging  [facility] [address] [place], [place] [address] [phone]

## 2019-09-23 NOTE — Telephone Encounter (Signed)
JCV virus put in [facility] box for processing.

## 2019-09-24 LAB — CBC WITH DIFFERENTIAL/PLATELET
Basophils Absolute: 0.1 10*3/uL (ref 0.0–0.2)
Basos: 1 %
EOS (ABSOLUTE): 0.3 10*3/uL (ref 0.0–0.4)
Eos: 6 %
Hematocrit: 44.4 % (ref 37.5–51.0)
Hemoglobin: 14.8 g/dL (ref 13.0–17.7)
Immature Grans (Abs): 0 10*3/uL (ref 0.0–0.1)
Immature Granulocytes: 0 %
Lymphocytes Absolute: 1.5 10*3/uL (ref 0.7–3.1)
Lymphs: 26 %
MCH: 29.7 pg (ref 26.6–33.0)
MCHC: 33.3 g/dL (ref 31.5–35.7)
MCV: 89 fL (ref 79–97)
Monocytes Absolute: 0.5 10*3/uL (ref 0.1–0.9)
Monocytes: 8 %
Neutrophils Absolute: 3.3 10*3/uL (ref 1.4–7.0)
Neutrophils: 59 %
Platelets: 220 10*3/uL (ref 150–450)
RBC: 4.98 x10E6/uL (ref 4.14–5.80)
RDW: 12.6 % (ref 11.6–15.4)
WBC: 5.7 10*3/uL (ref 3.4–10.8)

## 2019-09-24 LAB — HEPATIC FUNCTION PANEL
ALT: 13 IU/L (ref 0–44)
AST: 14 IU/L (ref 0–40)
Albumin: 4.4 g/dL (ref 4.0–5.0)
Alkaline Phosphatase: 92 IU/L (ref 39–117)
Bilirubin Total: 0.4 mg/dL (ref 0.0–1.2)
Bilirubin, Direct: 0.13 mg/dL (ref 0.00–0.40)
Total Protein: 7.7 g/dL (ref 6.0–8.5)

## 2019-09-24 LAB — VITAMIN D 25 HYDROXY (VIT D DEFICIENCY, FRACTURES): Vit D, 25-Hydroxy: 19.5 ng/mL — ABNORMAL LOW (ref 30.0–100.0)

## 2019-09-28 ENCOUNTER — Telehealth: Payer: Self-pay | Admitting: *Deleted

## 2019-09-28 DIAGNOSIS — R7989 Other specified abnormal findings of blood chemistry: Secondary | ICD-10-CM

## 2019-09-28 MED ORDER — VITAMIN D (ERGOCALCIFEROL) 1.25 MG (50000 UNIT) PO CAPS: ORAL_CAPSULE | ORAL | 1 refills | Status: DC

## 2019-09-28 NOTE — Telephone Encounter (Signed)
-----   Message from Asa Lente, MD sent at 09/24/2019  8:56 AM EST ----- Please let the patient know that the vit D was low  Lets supplement 50000 U weekly x 26 weeks then 5000 U OTC daily  Please send in a script for  Vit D 50,000 U  #13  #1 refill

## 2019-09-28 NOTE — Telephone Encounter (Signed)
Called and spoke with pt. Relayed lab results per MD. He verbalized understanding. Escribed rx for Vit D to pharmacy.

## 2019-10-05 NOTE — Telephone Encounter (Signed)
JCV ab drawn on 09/23/19 indeterminate, index: 0.30. Inhibition assay: negative.

## 2019-10-12 ENCOUNTER — Other Ambulatory Visit: Payer: Self-pay | Admitting: *Deleted

## 2019-10-12 DIAGNOSIS — G35 Multiple sclerosis: Secondary | ICD-10-CM

## 2019-10-12 MED ORDER — AUBAGIO 14 MG PO TABS: ORAL_TABLET | ORAL | 11 refills | Status: DC

## 2020-02-08 ENCOUNTER — Other Ambulatory Visit: Payer: Self-pay

## 2020-02-08 ENCOUNTER — Emergency Department
Admission: EM | Admit: 2020-02-08 | Discharge: 2020-02-08 | Disposition: A | Discharge status: Home / Self Care | Payer: BLUE CROSS/BLUE SHIELD | Attending: Emergency Medicine | Admitting: Emergency Medicine

## 2020-02-08 ENCOUNTER — Emergency Department: Payer: BLUE CROSS/BLUE SHIELD

## 2020-02-08 ENCOUNTER — Encounter: Payer: Self-pay | Admitting: Emergency Medicine

## 2020-02-08 DIAGNOSIS — I10 Essential (primary) hypertension: Secondary | ICD-10-CM | POA: Insufficient documentation

## 2020-02-08 DIAGNOSIS — Z89422 Acquired absence of other left toe(s): Secondary | ICD-10-CM | POA: Diagnosis not present

## 2020-02-08 DIAGNOSIS — R1032 Left lower quadrant pain: Secondary | ICD-10-CM | POA: Diagnosis not present

## 2020-02-08 DIAGNOSIS — Z794 Long term (current) use of insulin: Secondary | ICD-10-CM | POA: Diagnosis not present

## 2020-02-08 DIAGNOSIS — R109 Unspecified abdominal pain: Secondary | ICD-10-CM

## 2020-02-08 DIAGNOSIS — G35 Multiple sclerosis: Secondary | ICD-10-CM | POA: Diagnosis not present

## 2020-02-08 DIAGNOSIS — E119 Type 2 diabetes mellitus without complications: Secondary | ICD-10-CM | POA: Diagnosis not present

## 2020-02-08 DIAGNOSIS — Z79899 Other long term (current) drug therapy: Secondary | ICD-10-CM | POA: Insufficient documentation

## 2020-02-08 HISTORY — DX: Multiple sclerosis: G35

## 2020-02-08 HISTORY — DX: Multiple sclerosis, unspecified: G35.D

## 2020-02-08 LAB — URINALYSIS, COMPLETE (UACMP) WITH MICROSCOPIC
Bacteria, UA: NONE SEEN
Bilirubin Urine: NEGATIVE
Glucose, UA: 150 mg/dL — AB
Ketones, ur: NEGATIVE mg/dL
Leukocytes,Ua: NEGATIVE
Nitrite: NEGATIVE
Protein, ur: NEGATIVE mg/dL
RBC / HPF: >50 RBC/hpf — ABNORMAL HIGH (ref 0–5)
Specific Gravity, Urine: 1.016 (ref 1.005–1.030)
pH: 5 (ref 5.0–8.0)

## 2020-02-08 LAB — COMPREHENSIVE METABOLIC PANEL
ALT: 16 U/L (ref 0–44)
AST: 19 U/L (ref 15–41)
Albumin: 4.3 g/dL (ref 3.5–5.0)
Alkaline Phosphatase: 69 U/L (ref 38–126)
Anion gap: 9 (ref 5–15)
BUN: 32 mg/dL — ABNORMAL HIGH (ref 6–20)
CO2: 24 mmol/L (ref 22–32)
Calcium: 9.4 mg/dL (ref 8.9–10.3)
Chloride: 103 mmol/L (ref 98–111)
Creatinine, Ser: 1.7 mg/dL — ABNORMAL HIGH (ref 0.61–1.24)
GFR calc Af Amer: 54 mL/min — ABNORMAL LOW (ref >60)
GFR calc non Af Amer: 46 mL/min — ABNORMAL LOW (ref >60)
Glucose, Bld: 224 mg/dL — ABNORMAL HIGH (ref 70–99)
Potassium: 5.2 mmol/L — ABNORMAL HIGH (ref 3.5–5.1)
Sodium: 136 mmol/L (ref 135–145)
Total Bilirubin: 0.8 mg/dL (ref 0.3–1.2)
Total Protein: 8.2 g/dL — ABNORMAL HIGH (ref 6.5–8.1)

## 2020-02-08 LAB — CBC
HCT: 41.8 % (ref 39.0–52.0)
Hemoglobin: 13.8 g/dL (ref 13.0–17.0)
MCH: 29.6 pg (ref 26.0–34.0)
MCHC: 33 g/dL (ref 30.0–36.0)
MCV: 89.5 fL (ref 80.0–100.0)
Platelets: 231 10*3/uL (ref 150–400)
RBC: 4.67 MIL/uL (ref 4.22–5.81)
RDW: 13.5 % (ref 11.5–15.5)
WBC: 9.7 10*3/uL (ref 4.0–10.5)
nRBC: 0 % (ref 0.0–0.2)

## 2020-02-08 LAB — LIPASE, BLOOD: Lipase: 30 U/L (ref 11–51)

## 2020-02-08 MED ORDER — SODIUM CHLORIDE 0.9% FLUSH: 3.0000 mL | Freq: Once | INTRAVENOUS | Status: DC

## 2020-02-08 NOTE — ED Triage Notes (Signed)
C/O LLQ abdominal pain, onset of pain this morning.  Denies N/V/D.  Last BM 0300.  AAOx3.  Skin warm and dry. NAD

## 2020-02-08 NOTE — ED Notes (Signed)
Pt resting in bed at this time with call bell in reach. Pt updated on wait for CT and denies further needs at this time.

## 2020-02-08 NOTE — Discharge Instructions (Addendum)
As we discussed your work-up is most consistent with a recently passed kidney stone.  However it also shows some mild kidney insufficiency, with a slightly elevated potassium.  Please drink plenty of nonsugary fluids.  Please follow-up with your doctor for recheck of your labs Thursday or Friday of this week.  Return to the emergency department for any fever, any return of abdominal pain, or any other symptom personally concerning to yourself.

## 2020-02-08 NOTE — ED Provider Notes (Signed)
[facility] Emergency Department Provider Note  Time seen: 1:47 PM  I have reviewed the triage vital signs and the nursing notes.   HISTORY  Chief Complaint Abdominal Pain   HPI [name] is a 49 y.o. male with a past medical history anxiety, diabetes, hypertension, MS, presents to the emergency department for left flank pain.  According to the patient around 8:00 this morning he had onset of severe sharp left flank pain.  Denies any nausea vomiting or diarrhea.  Denies any hematuria or dysuria.  No history of kidney stones.  Patient states the pain is mild currently has improved while waiting in the waiting room.  Denies any chest pain.  Largely negative review of systems.   Past Medical History:  Diagnosis Date  . Anxiety   . Diabetes mellitus without complication (HCC)   . Hypertension   . MS (multiple sclerosis) [facility])     Patient Active Problem List   Diagnosis Date Noted  . Multiple sclerosis (HCC) 09/10/2018  . Ataxic gait 09/10/2018  . High risk medication use 09/10/2018  . Osteomyelitis of ankle or foot, acute (HCC) 10/01/2017  . Varicose veins with pain 07/06/2017  . Diabetes mellitus type 2 in obese (HCC) 03/13/2017  . Peripheral artery disease (HCC) 03/13/2017  . Chronic venous insufficiency 03/13/2017  . Hyperlipidemia 03/13/2017  . Essential hypertension 03/13/2017    Past Surgical History:  Procedure Laterality Date  . 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 CATARACT EXTRACTION, BILATERAL    . TOE AMPUTATION      Prior to Admission medications   Medication Sig Start Date End Date Taking? Authorizing Provider  Continuous Blood Gluc Sensor (FREESTYLE LIBRE SENSOR SYSTEM) MISC U UTD Q 10 DAYS. 06/07/17   [provider]  gabapentin (NEURONTIN) 400 MG capsule Take 200 mg by mouth 2 (two) times daily. 2-100mg   capsules in the morning and at night    [provider]  ibuprofen (ADVIL,MOTRIN) 200 MG tablet Take 200 mg by mouth every 6 (six) hours as needed.    [provider]  Insulin Detemir (LEVEMIR FLEXTOUCH) 100 UNIT/ML Pen Inject 40 Units into the skin daily. 06/30/18   [provider]  insulin NPH-regular Human (NOVOLIN 70/30) (70-30) 100 UNIT/ML injection Inject 30 Units into the skin 2 (two) times daily with a meal. 10/04/17   Katha Hamming, MD  lisinopril-hydrochlorothiazide (PRINZIDE,ZESTORETIC) 10-12.5 MG tablet Take 1 tablet by mouth daily 03/06/17   [provider]  Multiple Vitamin (MULTIVITAMIN WITH MINERALS) TABS tablet Take 1 tablet by mouth daily. 10/05/17   Katha Hamming, MD  PARoxetine (PAXIL) 20 MG tablet Take 40 mg by mouth daily.  03/06/17   [provider]  Teriflunomide (AUBAGIO) 14 MG TABS Take 1 tablet by mouth daily 10/12/19   Sater, Pearletha Furl, MD  Vitamin D, Ergocalciferol, (DRISDOL) 1.25 MG (50000 UT) CAPS capsule Take 1 capsule once weekly for 26 weeks. Then switch to 5000U OTC daily thereafter. 09/28/19   Sater, Pearletha Furl, MD    No Known Allergies  No family history on file.  Social History Social History   Tobacco Use  . Smoking status: Never Smoker  . Smokeless tobacco: Never Used  Substance Use Topics  . Alcohol use: No  . Drug use: No    Review of Systems Constitutional: Negative for fever Cardiovascular: Negative for chest pain. Respiratory: Negative  for shortness of breath. Gastrointestinal: Left flank pain. Genitourinary: Negative for urinary compaints Musculoskeletal: Negative for musculoskeletal complaints Neurological: Negative for headache All other ROS negative  ____________________________________________   PHYSICAL EXAM:  VITAL SIGNS: ED Triage Vitals  Enc Vitals Group     BP 02/08/20 1026 133/74     Pulse Rate 02/08/20 1026 93     Resp 02/08/20 1026 18     Temp 02/08/20 1026 98.2  F (36.8 C)     Temp Source 02/08/20 1026 Oral     SpO2 02/08/20 1026 99 %     Weight 02/08/20 1026 288 lb (130.6 kg)     Height 02/08/20 1026 5\' 11"  (1.803 m)     Head Circumference --      Peak Flow --      Pain Score 02/08/20 1042 9     Pain Loc --      Pain Edu? --      Excl. in [place]? --    Constitutional: Alert and oriented. Well appearing and in no distress. Eyes: Normal exam ENT      Head: Normocephalic and atraumatic.      Mouth/Throat: Mucous membranes are moist. Cardiovascular: Normal rate, regular rhythm Respiratory: Normal respiratory effort without tachypnea nor retractions. Breath sounds are clear  Gastrointestinal: Soft, slight left lower quadrant tenderness palpation without rebound guarding or distention.  No CVA tenderness. Musculoskeletal: Nontender with normal range of motion in all extremities.  Neurologic:  Normal speech and language. No gross focal neurologic deficits Skin:  Skin is warm, dry and intact.  Psychiatric: Mood and affect are normal.   ____________________________________________   RADIOLOGY  CT scan shows mild left hydroureteronephrosis without obstructing stone noted.  ____________________________________________   INITIAL IMPRESSION / ASSESSMENT AND PLAN / ED COURSE  Pertinent labs & imaging results that were available during my care of the patient were reviewed by me and considered in my medical decision making (see chart for details).   Patient presents to the emergency department for left-sided abdominal pain.  Currently the patient appears well, no distress.  States the pain is mild but was severe earlier today, sharp in nature.  Differential would include ureterolithiasis, colitis, diverticulitis, intestinal discomfort, UTI or pyelonephritis.  We will obtain a CT renal scan to further evaluate.  Urinalysis is pending.  Patient's urinalysis shows a large amount of red cells.  CT scan shows stranding with hydronephrosis but no stone.   Possibly indicating recently passed stone which fits the patient's clinical picture of severe pain now with complete resolution.  I have added on a urine culture as a precaution.  I spoke to the patient regarding his slight renal insufficiency he needs to orally hydrate and follow-up with his doctor at the end of the week to recheck his labs.  Patient agreeable to plan of care.  RALLY OUCH was evaluated in Emergency Department on 02/08/2020 for the symptoms described in the history of present illness. He was evaluated in the context of the global COVID-19 pandemic, which necessitated consideration that the patient might be at risk for infection with the SARS-CoV-2 virus that causes COVID-19. Institutional protocols and algorithms that pertain to the evaluation of patients at risk for COVID-19 are in a state of rapid change based on information released by regulatory bodies including the CDC and federal and state organizations. These policies and algorithms were followed during the patient's care in the ED.  ____________________________________________   FINAL CLINICAL IMPRESSION(S) / ED DIAGNOSES  Left flank  pain   Minna Antis, MD 02/08/20 [phone]

## 2020-04-28 ENCOUNTER — Telehealth: Payer: Self-pay | Admitting: Neurology

## 2020-04-28 NOTE — Telephone Encounter (Signed)
I left a voicemail for [name] to call me back.

## 2020-04-28 NOTE — Telephone Encounter (Signed)
[facility]/Jennifer was returning your phone call.

## 2020-04-28 NOTE — Telephone Encounter (Signed)
[name] returned my call. She was just wanting to know about the patients MRI. I stated it was order in Dec. By Dr. Epimenio Foot and sent to [facility] because of his insurance.   I told her that the order is still good and I will redo that authorization.  The new auth: [id number] (exp. 04/28/20 to 10/24/20) for [facility]. Because that is who is in network with his insurance. Faxed the order they will reach out to the patient to schedule. Ph# [phone] & Fax # [phone] for the [facility] location.

## 2020-07-12 ENCOUNTER — Telehealth: Payer: Self-pay | Admitting: *Deleted

## 2020-07-12 NOTE — Telephone Encounter (Signed)
Called and spoke with pt. Advised we received fax from [facility] that they have been trying to reach him to schedule delivery of his Aubagio. He states he tried to call yesterday but they had some emergency. He was going to try and call again today. I provided him the phone number from the fax they sent [place]: [phone]. He will call back if he runs into any issues.

## 2020-07-26 ENCOUNTER — Other Ambulatory Visit: Payer: Self-pay

## 2020-07-26 ENCOUNTER — Ambulatory Visit: Payer: BLUE CROSS/BLUE SHIELD | Admitting: Neurology

## 2020-07-26 ENCOUNTER — Encounter: Payer: Self-pay | Admitting: Neurology

## 2020-07-26 VITALS — BP 142/72 | HR 84 | Ht 71.0 in | Wt 304.5 lb

## 2020-07-26 DIAGNOSIS — R5383 Other fatigue: Secondary | ICD-10-CM | POA: Insufficient documentation

## 2020-07-26 DIAGNOSIS — Z79899 Other long term (current) drug therapy: Secondary | ICD-10-CM | POA: Diagnosis not present

## 2020-07-26 DIAGNOSIS — G35 Multiple sclerosis: Secondary | ICD-10-CM | POA: Diagnosis not present

## 2020-07-26 DIAGNOSIS — E559 Vitamin D deficiency, unspecified: Secondary | ICD-10-CM | POA: Diagnosis not present

## 2020-07-26 DIAGNOSIS — R3915 Urgency of urination: Secondary | ICD-10-CM | POA: Insufficient documentation

## 2020-07-26 DIAGNOSIS — R260 Ataxic gait: Secondary | ICD-10-CM | POA: Diagnosis not present

## 2020-07-26 DIAGNOSIS — E1169 Type 2 diabetes mellitus with other specified complication: Secondary | ICD-10-CM

## 2020-07-26 DIAGNOSIS — E669 Obesity, unspecified: Secondary | ICD-10-CM

## 2020-07-26 NOTE — Progress Notes (Signed)
[facility]  PATIENT: [name] DOB: December 12, 1970  REFERRING DOCTOR OR PCP:    Polo Riley (PCP); Theora Master (neurology) SOURCE: Patient, notes from Drs Malvin Johns and [facility], laboratory and imaging reports, MRI images personally reviewed.  _________________________________   HISTORICAL  CHIEF COMPLAINT:  Chief Complaint  Patient presents with  . Multiple Sclerosis    Room 13. He has continued taking Aubagio. He has concerns of body aches and fatigue today.     HISTORY OF PRESENT ILLNESS:   [name] is a 49 y.o.  man with relapsing remitting multiple sclerosis    Update 07/26/2020: He is on Aubagio and tolerates it well.   No definite exacerbation but since the last visit, he feels his balance is worse and he has had a 8-9 falls if he trips.   A couple falls were on the stairs going down even while holding the bannister.   He can walk a mile without stopping but not as well as he was able to a couple years ago.    He has some numbness in his hands but not feet.   He feels strength is symmetric.  He has urinary urgency but no incontinence is fine.   Vision is fine with glasses.      He notes fatigue most days, this is both physical and cognitive.   He notes memory is worse and he has word finding issues at times.   He has reduced focus.   He notes some anxiety > depression.   He sleeps well most night.   He snores but does not have pauses or gasps/snorts.   He falls asleep at times watching TV in the evenings but rarely before supper.    He has never had a sleep study.  EPWORTH SLEEPINESS SCALE  On a scale of 0 - 3 what is the chance of dozing:  Sitting and Reading:  0 Watching TV:   3 Sitting inactive in a public place: 0 Passenger in car for one hour: 0 Lying down to rest in the afternoon: 3 Sitting and talking to someone: 0 Sitting quietly after lunch:  1 In a car, stopped in traffic:  0  Total (out of 24):   7/24    MS History: In  December 2018, he noted chest pain, slurred speech and trouble with his gait.    An MRI of the brain was consistent with MS.   He followed up with Dr. Malvin Johns who ordered an LP.   CSF showed oligoclonal bands (6 unique bands) and he was diagnosed with MS and referred to Dr. Ronalee Red at [facility] who confirmed the diagnosis.  He started [place] in December 2019.   LABS February 2019 he had lab work to rule out hep B and tuberculosis.  Lab work was negative.Varicella-zoster antibodies are positive.  ANA was negative.  Vitamin D was low 03/05/2018 at 17.4.   He is JCV antibody Low positive (0.43 on 06/04/2018).    IMAGING MRI of the brain dated 09/03/2017.    It shows multiple T2/flair hyperintense foci in the cerebellar hemispheres, left middle cerebellar peduncle, right pons and in the hemispheres in a pattern and configuration consistent with chronic demyelinating plaque associated with multiple sclerosis.  One focus in the right parieto-occipital region appears hyperintense on diffusion-weighted images and may be subacute   REVIEW OF SYSTEMS: Constitutional: No fevers, chills, sweats, or change in appetite.  He notes some fatigue, especially with heat Eyes: No visual changes, double vision,  eye pain Ear, nose and throat: No hearing loss, ear pain, nasal congestion, sore throat Cardiovascular: No chest pain, palpitations Respiratory: No shortness of breath at rest or with exertion.   No wheezes GastrointestinaI: No nausea, vomiting, diarrhea, abdominal pain, fecal incontinence Genitourinary: No dysuria, urinary retention or frequency.  No nocturia. Musculoskeletal: No neck pain, back pain Integumentary: No rash, pruritus.  He had a foot skin ulcer in the past Neurological: as above Psychiatric: No depression at this time.  No anxiety Endocrine: He has diabetes mellitus.  The hemoglobin A1c was 7.2 on insulin 09/03/2018. Hematologic/Lymphatic: No anemia, purpura, petechiae. Allergic/Immunologic: No  itchy/runny eyes, nasal congestion, recent allergic reactions, rashes  ALLERGIES: No Known Allergies  HOME MEDICATIONS:  Current Outpatient Medications:  .  Continuous Blood Gluc Sensor (FREESTYLE LIBRE SENSOR SYSTEM) MISC, U UTD Q 10 DAYS., Disp: , Rfl: 11 .  ibuprofen (ADVIL,MOTRIN) 200 MG tablet, Take 200 mg by mouth every 6 (six) hours as needed., Disp: , Rfl:  .  Insulin Detemir (LEVEMIR FLEXTOUCH) 100 UNIT/ML Pen, Inject 40 Units into the skin daily., Disp: , Rfl:  .  insulin NPH-regular Human (NOVOLIN 70/30) (70-30) 100 UNIT/ML injection, Inject 30 Units into the skin 2 (two) times daily with a meal., Disp: 10 mL, Rfl: 11 .  lisinopril-hydrochlorothiazide (PRINZIDE,ZESTORETIC) 10-12.5 MG tablet, Take 1 tablet by mouth daily, Disp: , Rfl: 1 .  Multiple Vitamin (MULTIVITAMIN WITH MINERALS) TABS tablet, Take 1 tablet by mouth daily., Disp: 30 tablet, Rfl: 0 .  PARoxetine (PAXIL) 20 MG tablet, Take 40 mg by mouth daily. , Disp: , Rfl:  .  Teriflunomide (AUBAGIO) 14 MG TABS, Take 1 tablet by mouth daily, Disp: 30 tablet, Rfl: 11  PAST MEDICAL HISTORY: Past Medical History:  Diagnosis Date  . Anxiety   . Diabetes mellitus without complication (HCC)   . Hypertension   . MS (multiple sclerosis) (HCC)     PAST SURGICAL HISTORY: Past Surgical History:  Procedure Laterality Date  . 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 CATARACT EXTRACTION, BILATERAL    . TOE AMPUTATION      FAMILY HISTORY: History reviewed. No pertinent family history.  SOCIAL HISTORY:  Social History   Socioeconomic History  . Marital status: Married    Spouse name: Alixander Rallis  . Number of children: Not on file  . Years of education: 39  . Highest education level: Not on file  Occupational History  . Occupation: [employer]  Tobacco Use  . Smoking status: Never Smoker  . Smokeless  tobacco: Never Used  Vaping Use  . Vaping Use: Never used  Substance and Sexual Activity  . Alcohol use: No  . Drug use: No  . Sexual activity: Yes  Other Topics Concern  . Not on file  Social History Narrative   Lives with wife   Caffeine use: some soda   Social Determinants of Health   Financial Resource Strain:   . Difficulty of Paying Living Expenses: Not on file  Food Insecurity:   . Worried About Programme researcher, broadcasting/film/video in the Last Year: Not on file  . Ran Out of Food in the Last Year: Not on file  Transportation Needs:   . Lack of Transportation (Medical): Not on file  . Lack of Transportation (Non-Medical): Not on file  Physical Activity:   . Days of Exercise per Week: Not on file  .  Minutes of Exercise per Session: Not on file  Stress:   . Feeling of Stress : Not on file  Social Connections:   . Frequency of Communication with Friends and Family: Not on file  . Frequency of Social Gatherings with Friends and Family: Not on file  . Attends Religious Services: Not on file  . Active Member of Clubs or Organizations: Not on file  . Attends Banker Meetings: Not on file  . Marital Status: Not on file  Intimate Partner Violence:   . Fear of Current or Ex-Partner: Not on file  . Emotionally Abused: Not on file  . Physically Abused: Not on file  . Sexually Abused: Not on file     PHYSICAL EXAM  Vitals:   07/26/20 1436  BP: (!) 142/72  Pulse: 84  Weight: (!) 304 lb 8 oz (138.1 kg)  Height: 5\' 11"  (1.803 m)    Body mass index is 42.47 kg/m.   General: The patient is well-developed and well-nourished and in no acute distress  Skin: Extremities are without rash or edema.  Neurologic Exam  Mental status: The patient is alert and oriented x 3 at the time of the examination. The patient has apparent normal recent and remote memory, with an apparently normal attention span and concentration ability.   Speech is normal.  Cranial nerves: Extraocular  movements are full. Pupils are equal, round, and reactive to light and accomodation.  Color vision is symmetric.   There is good facial sensation to temperature  Bilaterally. Facial strength is normal.  Trapezius and sternocleidomastoid strength is normal. No dysarthria is noted.   No obvious hearing deficits are noted.  Motor:  Muscle bulk is normal.   Tone is normal. Strength is  5 / 5 in all 4 extremities.   Sensory: Sensory testing is intact to pinprick, soft touch and vibration sensation in the arms.  He has reduced vibration in his toes.   Normal at the ankles  Coordination: Finger-nose-finger and heel-to-shin is performed well.   Gait and station: Station is normal.   Gait is mildly wide with reduced stride and tandem gait is wide. Romberg is negative.   Reflexes: Deep tendon reflexes are symmetric and 3 at the arms, 2 at the knees and 1 at the ankles.       DIAGNOSTIC DATA (LABS, IMAGING, TESTING) - I reviewed patient records, labs, notes, testing and imaging myself where available.  Lab Results  Component Value Date   WBC 9.7 02/08/2020   HGB 13.8 02/08/2020   HCT 41.8 02/08/2020   MCV 89.5 02/08/2020   PLT 231 02/08/2020      Component Value Date/Time   NA 136 02/08/2020 1027   NA 138 09/10/2018 0959   NA 131 (L) 08/22/2013 0454   K 5.2 (H) 02/08/2020 1027   K 4.6 08/22/2013 0454   CL 103 02/08/2020 1027   CL 99 08/22/2013 0454   CO2 24 02/08/2020 1027   CO2 28 08/22/2013 0454   GLUCOSE 224 (H) 02/08/2020 1027   GLUCOSE 131 (H) 08/22/2013 0454   BUN 32 (H) 02/08/2020 1027   BUN 23 09/10/2018 0959   BUN 31 (H) 08/22/2013 0454   CREATININE 1.70 (H) 02/08/2020 1027   CREATININE 1.50 (H) 08/23/2013 0415   CALCIUM 9.4 02/08/2020 1027   CALCIUM 9.3 08/22/2013 0454   PROT 8.2 (H) 02/08/2020 1027   PROT 7.7 09/23/2019 1137   PROT 9.5 (H) 08/21/2013 1153   ALBUMIN 4.3 02/08/2020  1027   ALBUMIN 4.4 09/23/2019 1137   ALBUMIN 3.2 (L) 08/21/2013 1153   AST 19  02/08/2020 1027   AST 21 08/21/2013 1153   ALT 16 02/08/2020 1027   ALT 29 08/21/2013 1153   ALKPHOS 69 02/08/2020 1027   ALKPHOS 83 08/21/2013 1153   BILITOT 0.8 02/08/2020 1027   BILITOT 0.4 09/23/2019 1137   BILITOT 0.7 08/21/2013 1153   GFRNONAA 46 (L) 02/08/2020 1027   GFRNONAA 56 (L) 08/23/2013 0415   GFRAA 54 (L) 02/08/2020 1027   GFRAA >60 08/23/2013 0415   No results found for: CHOL, HDL, LDLCALC, LDLDIRECT, TRIG, CHOLHDL Lab Results  Component Value Date   HGBA1C 9.1 (H) 10/02/2017      ASSESSMENT AND PLAN  Multiple sclerosis (HCC) - Plan: CBC with Differential/Platelet, MR CERVICAL SPINE W WO CONTRAST, MR BRAIN W WO CONTRAST, Comprehensive metabolic panel, CANCELED: Hepatic function panel  Ataxic gait  High risk medication use - Plan: CBC with Differential/Platelet, Comprehensive metabolic panel, CANCELED: Hepatic function panel  Vitamin D deficiency - Plan: VITAMIN D 25 Hydroxy (Vit-D Deficiency, Fractures)  Diabetes mellitus type 2 in obese (HCC)  Other fatigue  Urinary urgency   1. Continue Aubagio as his DMT.  We will check some lab work.  Additionally we need to check an MRI of the brain and cervical spine to determine if there is any subclinical activity.  If present, consider a switch to a more efficacious disease modifying therapy. 2.   Stay active and exercise as tolerated  3.  Use bannister with stairs and take extra care on uneven surfaces. 4.  Return in 6 months or sooner if there are new or worsening neurologic symptoms.  Joahan Swatzell A. Epimenio Foot, MD, [facility] 07/26/2020, 3:00 PM Certified in Neurology, Clinical Neurophysiology, Sleep Medicine, Pain Medicine and Neuroimaging  [facility] [address], [address] [place], [place] [address] [phone]

## 2020-07-27 LAB — COMPREHENSIVE METABOLIC PANEL
ALT: 15 IU/L (ref 0–44)
AST: 16 IU/L (ref 0–40)
Albumin/Globulin Ratio: 1.3 (ref 1.2–2.2)
Albumin: 3.9 g/dL — ABNORMAL LOW (ref 4.0–5.0)
Alkaline Phosphatase: 91 IU/L (ref 44–121)
BUN/Creatinine Ratio: 18 (ref 9–20)
BUN: 23 mg/dL (ref 6–24)
Bilirubin Total: 0.3 mg/dL (ref 0.0–1.2)
CO2: 25 mmol/L (ref 20–29)
Calcium: 9 mg/dL (ref 8.7–10.2)
Chloride: 101 mmol/L (ref 96–106)
Creatinine, Ser: 1.28 mg/dL — ABNORMAL HIGH (ref 0.76–1.27)
GFR calc Af Amer: 75 mL/min/{1.73_m2} (ref >59)
GFR calc non Af Amer: 65 mL/min/{1.73_m2} (ref >59)
Globulin, Total: 3 g/dL (ref 1.5–4.5)
Glucose: 210 mg/dL — ABNORMAL HIGH (ref 65–99)
Potassium: 4.6 mmol/L (ref 3.5–5.2)
Sodium: 139 mmol/L (ref 134–144)
Total Protein: 6.9 g/dL (ref 6.0–8.5)

## 2020-07-27 LAB — CBC WITH DIFFERENTIAL/PLATELET
Basophils Absolute: 0.1 10*3/uL (ref 0.0–0.2)
Basos: 1 %
EOS (ABSOLUTE): 0.3 10*3/uL (ref 0.0–0.4)
Eos: 5 %
Hematocrit: 40.2 % (ref 37.5–51.0)
Hemoglobin: 13.3 g/dL (ref 13.0–17.7)
Immature Grans (Abs): 0.1 10*3/uL (ref 0.0–0.1)
Immature Granulocytes: 1 %
Lymphocytes Absolute: 1.6 10*3/uL (ref 0.7–3.1)
Lymphs: 27 %
MCH: 30.4 pg (ref 26.6–33.0)
MCHC: 33.1 g/dL (ref 31.5–35.7)
MCV: 92 fL (ref 79–97)
Monocytes Absolute: 0.6 10*3/uL (ref 0.1–0.9)
Monocytes: 10 %
Neutrophils Absolute: 3.4 10*3/uL (ref 1.4–7.0)
Neutrophils: 56 %
Platelets: 211 10*3/uL (ref 150–450)
RBC: 4.38 x10E6/uL (ref 4.14–5.80)
RDW: 12.7 % (ref 11.6–15.4)
WBC: 6 10*3/uL (ref 3.4–10.8)

## 2020-07-27 LAB — VITAMIN D 25 HYDROXY (VIT D DEFICIENCY, FRACTURES): Vit D, 25-Hydroxy: 19.7 ng/mL — ABNORMAL LOW (ref 30.0–100.0)

## 2020-07-28 ENCOUNTER — Telehealth: Payer: Self-pay | Admitting: Neurology

## 2020-07-28 ENCOUNTER — Telehealth: Payer: Self-pay | Admitting: *Deleted

## 2020-07-28 DIAGNOSIS — R7989 Other specified abnormal findings of blood chemistry: Secondary | ICD-10-CM

## 2020-07-28 MED ORDER — VITAMIN D (ERGOCALCIFEROL) 1.25 MG (50000 UNIT) PO CAPS: ORAL_CAPSULE | ORAL | 1 refills | Status: DC

## 2020-07-28 NOTE — Telephone Encounter (Signed)
LVM for pt to call back about the location for the MRI  [employer] auth: [id number] (exp. 07/28/20 to 01/23/21)

## 2020-07-28 NOTE — Telephone Encounter (Signed)
Called and spoke with pt about lab results per Dr. Epimenio Foot note. Pt verbalized understanding.  E-scribed rx Vit D to [id number] - [place], [place] - [address] AT [facility] OF [address] per pt request.

## 2020-07-28 NOTE — Telephone Encounter (Signed)
-----   Message from Asa Lente, MD sent at 07/27/2020  7:59 PM EDT ----- Please let him know that the lab work shows the following: 1.   Vitamin D is low.  He should take 50,000 units weekly x26 weeks (#13 1 ref) 2.    Glucose was around 200 but he has known diabetes and is on insulin. 3.    Kidney tests were a little bit off but better than they were the last time blood work was done

## 2020-10-04 ENCOUNTER — Other Ambulatory Visit: Payer: Self-pay | Admitting: Neurology

## 2020-10-04 DIAGNOSIS — G35 Multiple sclerosis: Secondary | ICD-10-CM

## 2020-10-12 ENCOUNTER — Telehealth: Payer: Self-pay | Admitting: *Deleted

## 2020-10-12 NOTE — Telephone Encounter (Signed)
Received fax from [facility] that PA already on file, effective 09/18/2018-09/23/2038. Pt filled rx on 10/11/20. No further action required at this time.

## 2020-10-12 NOTE — Telephone Encounter (Signed)
Initiated PA Aubagio 14mg  on CMM. Key . Waiting on OV notes to be attached to PA and then will submit.

## 2020-10-12 NOTE — Telephone Encounter (Signed)
Submitted PA on CMM. Waiting on determination from [facility].

## 2020-11-03 ENCOUNTER — Other Ambulatory Visit: Payer: Self-pay | Admitting: Neurology

## 2020-11-03 DIAGNOSIS — G35 Multiple sclerosis: Secondary | ICD-10-CM

## 2020-12-07 ENCOUNTER — Other Ambulatory Visit: Payer: Self-pay | Admitting: Neurology

## 2020-12-07 DIAGNOSIS — R7989 Other specified abnormal findings of blood chemistry: Secondary | ICD-10-CM

## 2021-01-05 ENCOUNTER — Other Ambulatory Visit: Payer: Self-pay | Admitting: Neurology

## 2021-01-05 ENCOUNTER — Telehealth: Payer: Self-pay | Admitting: *Deleted

## 2021-01-05 DIAGNOSIS — R7989 Other specified abnormal findings of blood chemistry: Secondary | ICD-10-CM

## 2021-01-05 NOTE — Telephone Encounter (Signed)
LVM for pt letting him know we received fax from [facility] that they have been unable to reach him to schedule delivery of Aubagio. Asked that if he has not already done so, to please call them back at [phone].

## 2021-01-05 NOTE — Telephone Encounter (Signed)
Pt returned call and was informed.  

## 2021-01-05 NOTE — Telephone Encounter (Signed)
Noted  

## 2021-01-31 ENCOUNTER — Ambulatory Visit: Payer: BLUE CROSS/BLUE SHIELD | Admitting: Family Medicine

## 2021-04-19 ENCOUNTER — Ambulatory Visit: Payer: BLUE CROSS/BLUE SHIELD | Admitting: Family Medicine

## 2021-04-19 ENCOUNTER — Encounter: Payer: Self-pay | Admitting: Family Medicine

## 2021-04-19 VITALS — BP 116/76 | HR 80 | Ht 71.0 in | Wt 287.0 lb

## 2021-04-19 DIAGNOSIS — Z79899 Other long term (current) drug therapy: Secondary | ICD-10-CM | POA: Diagnosis not present

## 2021-04-19 DIAGNOSIS — G35 Multiple sclerosis: Secondary | ICD-10-CM | POA: Diagnosis not present

## 2021-04-19 DIAGNOSIS — R260 Ataxic gait: Secondary | ICD-10-CM

## 2021-04-19 DIAGNOSIS — R5383 Other fatigue: Secondary | ICD-10-CM | POA: Diagnosis not present

## 2021-04-19 DIAGNOSIS — R3915 Urgency of urination: Secondary | ICD-10-CM

## 2021-04-19 DIAGNOSIS — E559 Vitamin D deficiency, unspecified: Secondary | ICD-10-CM

## 2021-04-19 NOTE — Progress Notes (Addendum)
Chief Complaint  Patient presents with   Follow-up    Pt alone, new rm. Balance concerns are same, heat has been bad on him. Still taking Aubagio.       HISTORY OF PRESENT ILLNESS: 04/19/21 ALL:  [name] is a 50 y.o. male here today for follow up for RRMS. He continues Aubagio and tolerating well. Labs have been unremarkable with exception of elevated glucose (diabetic), elevated creatinine and low vitamin D. MRI brain and cervical spine ordered in 07/2020 but has not been performed. He will call to schedule.   He feels that symptoms are stable. He continues to have some mild imbalance and trips over things. He has had 3-4 falls over the past 6 months. No injuries. All falls are when he is holding something in his hand and trips over something in the floor. He is not using an assistive device.   He feels mood is good. He is sleeping well. No changes in bowel or bladder habits.   He was started on vitamin D rx 07/2020. He completed 26 weeks then started vitamin D OTC 5000iu daily.    HISTORY (copied from Dr Bonnita Hollow previous note)  [name] is a 50 y.o.  man with relapsing remitting multiple sclerosis     Update 07/26/2020: He is on Aubagio and tolerates it well.   No definite exacerbation but since the last visit, he feels his balance is worse and he has had a 8-9 falls if he trips.   A couple falls were on the stairs going down even while holding the bannister.   He can walk a mile without stopping but not as well as he was able to a couple years ago.    He has some numbness in his hands but not feet.   He feels strength is symmetric.  He has urinary urgency but no incontinence is fine.   Vision is fine with glasses.       He notes fatigue most days, this is both physical and cognitive.   He notes memory is worse and he has word finding issues at times.   He has reduced focus.   He notes some anxiety > depression.   He sleeps well most night.   He snores but does not have  pauses or gasps/snorts.   He falls asleep at times watching TV in the evenings but rarely before supper.    He has never had a sleep study.   EPWORTH SLEEPINESS SCALE   On a scale of 0 - 3 what is the chance of dozing:   Sitting and Reading:                          0 Watching TV:                                      3 Sitting inactive in a public place:       0 Passenger in car for one hour:          0 Lying down to rest in the afternoon:  3 Sitting and talking to someone:         0 Sitting quietly after lunch:                  1 In a car, stopped in traffic:  0   Total (out of 24):   7/24    MS History: In December 2018, he noted chest pain, slurred speech and trouble with his gait.    An MRI of the brain was consistent with MS.   He followed up with Dr. Malvin Johns who ordered an LP.   CSF showed oligoclonal bands (6 unique bands) and he was diagnosed with MS and referred to Dr. Ronalee Red at [facility] who confirmed the diagnosis.  He started [place] in December 2019.   LABS February 2019 he had lab work to rule out hep B and tuberculosis.  Lab work was negative.Varicella-zoster antibodies are positive.  ANA was negative.  Vitamin D was low 03/05/2018 at 17.4.   He is JCV antibody Low positive (0.43 on 06/04/2018).     IMAGING MRI of the brain dated 09/03/2017.    It shows multiple T2/flair hyperintense foci in the cerebellar hemispheres, left middle cerebellar peduncle, right pons and in the hemispheres in a pattern and configuration consistent with chronic demyelinating plaque associated with multiple sclerosis.  One focus in the right parieto-occipital region appears hyperintense on diffusion-weighted images and may be subacute   REVIEW OF SYSTEMS: Out of a complete 14 system review of symptoms, the patient complains only of the following symptoms, imbalance, falls, and all other reviewed systems are negative.   ALLERGIES: No Known Allergies   HOME MEDICATIONS: Outpatient  Medications Prior to Visit  Medication Sig Dispense Refill   AUBAGIO 14 MG TABS TAKE 1 TABLET BY MOUTH DAILY 30 tablet 11   busPIRone (BUSPAR) 5 MG tablet TAKE 1 TABLET(5 MG) BY MOUTH EVERY DAY     Continuous Blood Gluc Sensor (FREESTYLE LIBRE SENSOR SYSTEM) MISC U UTD Q 10 DAYS.  11   hydrochlorothiazide (HYDRODIURIL) 12.5 MG tablet Take 12.5 mg by mouth daily.     ibuprofen (ADVIL,MOTRIN) 200 MG tablet Take 200 mg by mouth every 6 (six) hours as needed.     insulin aspart (NOVOLOG FLEXPEN) 100 UNIT/ML FlexPen INJECT 55 UNITS UNDER THE SKIN THREE TIMES DAILY WITH MEALS     insulin detemir (LEVEMIR) 100 UNIT/ML FlexPen Inject 40 Units into the skin daily.     lisinopril (ZESTRIL) 10 MG tablet Take 10 mg by mouth daily.     Multiple Vitamin (MULTIVITAMIN WITH MINERALS) TABS tablet Take 1 tablet by mouth daily. 30 tablet 0   PARoxetine (PAXIL) 20 MG tablet Take 40 mg by mouth daily.      Vitamin D, Ergocalciferol, (DRISDOL) 1.25 MG (50000 UNIT) CAPS capsule Take 1 capsule by mouth once a week for 26 weeks 13 capsule 1   insulin NPH-regular Human (NOVOLIN 70/30) (70-30) 100 UNIT/ML injection Inject 30 Units into the skin 2 (two) times daily with a meal. 10 mL 11   lisinopril-hydrochlorothiazide (PRINZIDE,ZESTORETIC) 10-12.5 MG tablet Take 1 tablet by mouth daily  1   No facility-administered medications prior to visit.     PAST MEDICAL HISTORY: Past Medical History:  Diagnosis Date   Anxiety    Diabetes mellitus without complication (HCC)    Hypertension    MS (multiple sclerosis) (HCC)      PAST SURGICAL HISTORY: Past Surgical History:  Procedure Laterality Date   AMPUTATION Left 10/02/2017   Procedure: AMPUTATION RAY-LEFT 5TH;  Surgeon: Linus Galas, DPM;  Location: [facility] ORS;  Service: Podiatry;  Laterality: Left;   CATARACT EXTRACTION, BILATERAL     TOE AMPUTATION       FAMILY HISTORY: No family history on file.  SOCIAL HISTORY: Social History   Socioeconomic History    Marital status: Married    Spouse name: Javione Gunawan   Number of children: Not on file   Years of education: 12   Highest education level: Not on file  Occupational History   Occupation: [employer]  Tobacco Use   Smoking status: Never   Smokeless tobacco: Never  Vaping Use   Vaping Use: Never used  Substance and Sexual Activity   Alcohol use: No   Drug use: No   Sexual activity: Yes  Other Topics Concern   Not on file  Social History Narrative   Lives with wife   Caffeine use: some soda   Social Determinants of Health   Financial Resource Strain: Not on file  Food Insecurity: Not on file  Transportation Needs: Not on file  Physical Activity: Not on file  Stress: Not on file  Social Connections: Not on file  Intimate Partner Violence: Not on file     PHYSICAL EXAM  Vitals:   04/19/21 1120  BP: 116/76  Pulse: 80  Weight: 287 lb (130.2 kg)  Height: 5\' 11"  (1.803 m)   Body mass index is 40.03 kg/m.   Generalized: Well developed, in no acute distress  Cardiology: normal rate and rhythm, no murmur auscultated  Respiratory: clear to auscultation bilaterally    Neurological examination  Mentation: Alert oriented to time, place, history taking. Follows all commands speech and language fluent Cranial nerve II-XII: Pupils were equal round reactive to light. Extraocular movements were full, visual field were full on confrontational test. Facial sensation and strength were normal. Uvula tongue midline. Head turning and shoulder shrug  were normal and symmetric. Motor: The motor testing reveals 5 over 5 strength of all 4 extremities. Good symmetric motor tone is noted throughout.  Sensory: Sensory testing is intact to soft touch on all 4 extremities. No evidence of extinction is noted.  Coordination: Cerebellar testing reveals good finger-nose-finger and heel-to-shin bilaterally.  Gait and station: Station is normal. Gait is mildly wide with reduced stride.Tandem gait  is wide. Romberg is negative. No drift is seen.  Reflexes: Deep tendon reflexes are symmetric and normal bilaterally.    DIAGNOSTIC DATA (LABS, IMAGING, TESTING) - I reviewed patient records, labs, notes, testing and imaging myself where available.  Lab Results  Component Value Date   WBC 6.0 07/26/2020   HGB 13.3 07/26/2020   HCT 40.2 07/26/2020   MCV 92 07/26/2020   PLT 211 07/26/2020      Component Value Date/Time   NA 139 07/26/2020 1501   NA 131 (L) 08/22/2013 0454   K 4.6 07/26/2020 1501   K 4.6 08/22/2013 0454   CL 101 07/26/2020 1501   CL 99 08/22/2013 0454   CO2 25 07/26/2020 1501   CO2 28 08/22/2013 0454   GLUCOSE 210 (H) 07/26/2020 1501   GLUCOSE 224 (H) 02/08/2020 1027   GLUCOSE 131 (H) 08/22/2013 0454   BUN 23 07/26/2020 1501   BUN 31 (H) 08/22/2013 0454   CREATININE 1.28 (H) 07/26/2020 1501   CREATININE 1.50 (H) 08/23/2013 0415   CALCIUM 9.0 07/26/2020 1501   CALCIUM 9.3 08/22/2013 0454   PROT 6.9 07/26/2020 1501   PROT 9.5 (H) 08/21/2013 1153   ALBUMIN 3.9 (L) 07/26/2020 1501   ALBUMIN 3.2 (L) 08/21/2013 1153   AST 16 07/26/2020 1501   AST 21 08/21/2013 1153   ALT 15 07/26/2020 1501   ALT 29 08/21/2013 1153   ALKPHOS 91 07/26/2020  1501   ALKPHOS 83 08/21/2013 1153   BILITOT 0.3 07/26/2020 1501   BILITOT 0.7 08/21/2013 1153   GFRNONAA 65 07/26/2020 1501   GFRNONAA 56 (L) 08/23/2013 0415   GFRAA 75 07/26/2020 1501   GFRAA >60 08/23/2013 0415   No results found for: CHOL, HDL, LDLCALC, LDLDIRECT, TRIG, CHOLHDL Lab Results  Component Value Date   HGBA1C 9.1 (H) 10/02/2017   No results found for: VITAMINB12 No results found for: TSH  No flowsheet data found.   No flowsheet data found.   ASSESSMENT AND PLAN  50 y.o. year old male  has a past medical history of Anxiety, Diabetes mellitus without complication (HCC), Hypertension, and MS (multiple sclerosis) (HCC). here with    Relapsing remitting multiple sclerosis (HCC) - Plan: CBC with  Differential/Platelets, Hepatic Function Panel  High risk medication use - Plan: CBC with Differential/Platelets, Hepatic Function Panel  Ataxic gait  Other fatigue  Urinary urgency  Vitamin D deficiency - Plan: Vitamin D, 25-hydroxy   [place] is doing well, today. He will continue Aubagio as prescribed. We will update labs. He will call to schedule MRI. May adjust vitamin D pending levels. Healthy lifestyle habits encouraged. Fall precautions discussed. I have advised he use an assistive device to help with stability. He declines referral to PT. He will follow up with Dr Epimenio Foot in 6 months.    Orders Placed This Encounter  Procedures   CBC with Differential/Platelets   Hepatic Function Panel   Vitamin D, 25-hydroxy      No orders of the defined types were placed in this encounter.   Shawnie Dapper, MSN, FNP-C 04/19/2021, 12:27 PM  [facility] [address], [address] [place], [place] [address] [phone]     I have read the note, and I agree with the clinical assessment and plan.  Richard A. Epimenio Foot, MD, PhD, [facility] Certified in Neurology, Clinical Neurophysiology, Sleep Medicine, Pain Medicine and Neuroimaging  [facility] [address], [address] [place], [place] [address] [phone]

## 2021-04-19 NOTE — Patient Instructions (Signed)
Below is our plan:  We will continue Aubagio and vitamin D supplements. I will update labs, today. Please call to schedule MRI.   Please make sure you are staying well hydrated. I recommend 50-60 ounces daily. Well balanced diet and regular exercise encouraged. Consistent sleep schedule with 6-8 hours recommended.   Please continue follow up with care team as directed.   Follow up with Dr Epimenio Foot in 6 months  You may receive a survey regarding today's visit. I encourage you to leave honest feed back as I do use this information to improve patient care. Thank you for seeing me today!

## 2021-04-20 LAB — HEPATIC FUNCTION PANEL
ALT: 12 IU/L (ref 0–44)
AST: 16 IU/L (ref 0–40)
Albumin: 4.4 g/dL (ref 4.0–5.0)
Alkaline Phosphatase: 92 IU/L (ref 44–121)
Bilirubin Total: 0.4 mg/dL (ref 0.0–1.2)
Bilirubin, Direct: 0.11 mg/dL (ref 0.00–0.40)
Total Protein: 7.3 g/dL (ref 6.0–8.5)

## 2021-04-20 LAB — CBC WITH DIFFERENTIAL/PLATELET
Basophils Absolute: 0.1 10*3/uL (ref 0.0–0.2)
Basos: 1 %
EOS (ABSOLUTE): 0.3 10*3/uL (ref 0.0–0.4)
Eos: 5 %
Hematocrit: 42 % (ref 37.5–51.0)
Hemoglobin: 13.3 g/dL (ref 13.0–17.7)
Immature Grans (Abs): 0 10*3/uL (ref 0.0–0.1)
Immature Granulocytes: 1 %
Lymphocytes Absolute: 1.3 10*3/uL (ref 0.7–3.1)
Lymphs: 23 %
MCH: 28.9 pg (ref 26.6–33.0)
MCHC: 31.7 g/dL (ref 31.5–35.7)
MCV: 91 fL (ref 79–97)
Monocytes Absolute: 0.5 10*3/uL (ref 0.1–0.9)
Monocytes: 9 %
Neutrophils Absolute: 3.5 10*3/uL (ref 1.4–7.0)
Neutrophils: 61 %
Platelets: 219 10*3/uL (ref 150–450)
RBC: 4.61 x10E6/uL (ref 4.14–5.80)
RDW: 13.2 % (ref 11.6–15.4)
WBC: 5.7 10*3/uL (ref 3.4–10.8)

## 2021-04-20 LAB — VITAMIN D 25 HYDROXY (VIT D DEFICIENCY, FRACTURES): Vit D, 25-Hydroxy: 47.4 ng/mL (ref 30.0–100.0)

## 2021-08-20 ENCOUNTER — Other Ambulatory Visit: Payer: Self-pay

## 2021-08-20 ENCOUNTER — Emergency Department
Admission: EM | Admit: 2021-08-20 | Discharge: 2021-08-20 | Disposition: A | Discharge status: Home / Self Care | Payer: BLUE CROSS/BLUE SHIELD | Attending: Emergency Medicine | Admitting: Emergency Medicine

## 2021-08-20 ENCOUNTER — Emergency Department: Payer: BLUE CROSS/BLUE SHIELD

## 2021-08-20 DIAGNOSIS — R519 Headache, unspecified: Secondary | ICD-10-CM | POA: Diagnosis present

## 2021-08-20 DIAGNOSIS — Z5321 Procedure and treatment not carried out due to patient leaving prior to being seen by health care provider: Secondary | ICD-10-CM | POA: Insufficient documentation

## 2021-08-20 NOTE — ED Triage Notes (Signed)
Pt presents via POV c/o headache . Reports yesterday fell and hit head on tractor trailer. Small abrasion to top of head noted. C/o blurred vision and trouble ambulating today per pt report.

## 2021-08-22 ENCOUNTER — Telehealth: Payer: Self-pay | Admitting: Family Medicine

## 2021-08-22 NOTE — Telephone Encounter (Signed)
Spoke w/ billing. He spoke w/ Angie and made payment. I tried calling back. Went to VM. Please see if he wants appt on Thursday that is on hold if he calls

## 2021-08-22 NOTE — Telephone Encounter (Signed)
Pt called back and spoke w/ Pattricia Boss and accepted appt. Pt scheduled.

## 2021-08-22 NOTE — Telephone Encounter (Signed)
Pt called, having MS flare up; balance is off have been falling, leg weakness, no appetite. Would like a call from the nurse to discuss a sooner appt.   Scheduled appt for 11/01/21 and added to the waitlist.

## 2021-08-22 NOTE — Telephone Encounter (Signed)
LVM returning pt call. Was going to offer appt w/ Dr. Epimenio Foot on 08/24/21 at 10am. Please offer if he calls back.

## 2021-08-23 ENCOUNTER — Other Ambulatory Visit
Admission: RE | Admit: 2021-08-23 | Discharge: 2021-08-23 | Disposition: A | Discharge status: Home / Self Care | Payer: BLUE CROSS/BLUE SHIELD | Source: Ambulatory Visit | Attending: Podiatry | Admitting: Podiatry

## 2021-08-23 ENCOUNTER — Other Ambulatory Visit: Payer: Self-pay

## 2021-08-23 ENCOUNTER — Emergency Department
Admission: EM | Admit: 2021-08-23 | Discharge: 2021-08-23 | Disposition: A | Discharge status: Home / Self Care | Payer: BLUE CROSS/BLUE SHIELD | Attending: Emergency Medicine | Admitting: Emergency Medicine

## 2021-08-23 ENCOUNTER — Emergency Department: Payer: BLUE CROSS/BLUE SHIELD

## 2021-08-23 DIAGNOSIS — Z20822 Contact with and (suspected) exposure to covid-19: Secondary | ICD-10-CM | POA: Insufficient documentation

## 2021-08-23 DIAGNOSIS — Z5321 Procedure and treatment not carried out due to patient leaving prior to being seen by health care provider: Secondary | ICD-10-CM | POA: Diagnosis not present

## 2021-08-23 DIAGNOSIS — R791 Abnormal coagulation profile: Secondary | ICD-10-CM | POA: Diagnosis not present

## 2021-08-23 DIAGNOSIS — L03116 Cellulitis of left lower limb: Secondary | ICD-10-CM | POA: Insufficient documentation

## 2021-08-23 DIAGNOSIS — L089 Local infection of the skin and subcutaneous tissue, unspecified: Secondary | ICD-10-CM | POA: Diagnosis present

## 2021-08-23 LAB — CBC WITH DIFFERENTIAL/PLATELET
Abs Immature Granulocytes: 0.07 10*3/uL (ref 0.00–0.07)
Basophils Absolute: 0 10*3/uL (ref 0.0–0.1)
Basophils Relative: 0 %
Eosinophils Absolute: 0 10*3/uL (ref 0.0–0.5)
Eosinophils Relative: 0 %
HCT: 39.9 % (ref 39.0–52.0)
Hemoglobin: 13.1 g/dL (ref 13.0–17.0)
Immature Granulocytes: 1 %
Lymphocytes Relative: 3 %
Lymphs Abs: 0.4 10*3/uL — ABNORMAL LOW (ref 0.7–4.0)
MCH: 29 pg (ref 26.0–34.0)
MCHC: 32.8 g/dL (ref 30.0–36.0)
MCV: 88.3 fL (ref 80.0–100.0)
Monocytes Absolute: 0.8 10*3/uL (ref 0.1–1.0)
Monocytes Relative: 6 %
Neutro Abs: 12 10*3/uL — ABNORMAL HIGH (ref 1.7–7.7)
Neutrophils Relative %: 90 %
Platelets: 288 10*3/uL (ref 150–400)
RBC: 4.52 MIL/uL (ref 4.22–5.81)
RDW: 12.7 % (ref 11.5–15.5)
WBC: 13.4 10*3/uL — ABNORMAL HIGH (ref 4.0–10.5)
nRBC: 0 % (ref 0.0–0.2)

## 2021-08-23 LAB — PROTIME-INR
INR: 1.3 — ABNORMAL HIGH (ref 0.8–1.2)
Prothrombin Time: 15.7 seconds — ABNORMAL HIGH (ref 11.4–15.2)

## 2021-08-23 LAB — RESP PANEL BY RT-PCR (FLU A&B, COVID) ARPGX2
Influenza A by PCR: NEGATIVE
Influenza B by PCR: NEGATIVE
SARS Coronavirus 2 by RT PCR: NEGATIVE

## 2021-08-23 LAB — COMPREHENSIVE METABOLIC PANEL
ALT: 12 U/L (ref 0–44)
AST: 19 U/L (ref 15–41)
Albumin: 3.1 g/dL — ABNORMAL LOW (ref 3.5–5.0)
Alkaline Phosphatase: 63 U/L (ref 38–126)
Anion gap: 13 (ref 5–15)
BUN: 35 mg/dL — ABNORMAL HIGH (ref 6–20)
CO2: 23 mmol/L (ref 22–32)
Calcium: 8.7 mg/dL — ABNORMAL LOW (ref 8.9–10.3)
Chloride: 88 mmol/L — ABNORMAL LOW (ref 98–111)
Creatinine, Ser: 1.68 mg/dL — ABNORMAL HIGH (ref 0.61–1.24)
GFR, Estimated: 49 mL/min — ABNORMAL LOW (ref >60)
Glucose, Bld: 420 mg/dL — ABNORMAL HIGH (ref 70–99)
Potassium: 4.1 mmol/L (ref 3.5–5.1)
Sodium: 124 mmol/L — ABNORMAL LOW (ref 135–145)
Total Bilirubin: 1.2 mg/dL (ref 0.3–1.2)
Total Protein: 9 g/dL — ABNORMAL HIGH (ref 6.5–8.1)

## 2021-08-23 LAB — LACTIC ACID, PLASMA: Lactic Acid, Venous: 2.2 mmol/L (ref 0.5–1.9)

## 2021-08-23 NOTE — ED Notes (Signed)
Pt refused xray 

## 2021-08-23 NOTE — ED Triage Notes (Signed)
Pt here with a left foot infection. Pt sent here by primary provider for admission and abx. Pt in NAD in triage.

## 2021-08-23 NOTE — ED Notes (Signed)
Pt sat down and laid in the floor. Pt refuses to get into a chair at this time

## 2021-08-24 ENCOUNTER — Ambulatory Visit: Payer: BLUE CROSS/BLUE SHIELD | Admitting: Neurology

## 2021-08-26 LAB — AEROBIC/ANAEROBIC CULTURE W GRAM STAIN (SURGICAL/DEEP WOUND)

## 2021-08-28 ENCOUNTER — Telehealth: Payer: Self-pay | Admitting: Family Medicine

## 2021-08-28 LAB — CULTURE, BLOOD (ROUTINE X 2)
Culture: NO GROWTH
Culture: NO GROWTH
Special Requests: ADEQUATE
Special Requests: ADEQUATE

## 2021-08-28 NOTE — Telephone Encounter (Signed)
Dr. Rocky Morel from [facility] resident w/vascular surgery team called having some questions about the AUBAGIO 14 MG TABS. Dr. Rocky Morel requesting a call back on his cell [phone].

## 2021-08-28 NOTE — Telephone Encounter (Signed)
Dr Epimenio Foot contacted the MD and discussed his concerns with him.

## 2021-09-08 ENCOUNTER — Other Ambulatory Visit: Payer: Self-pay | Admitting: Neurology

## 2021-09-08 DIAGNOSIS — R7989 Other specified abnormal findings of blood chemistry: Secondary | ICD-10-CM

## 2021-11-01 ENCOUNTER — Other Ambulatory Visit: Payer: Self-pay | Admitting: Neurology

## 2021-11-01 ENCOUNTER — Ambulatory Visit: Payer: BLUE CROSS/BLUE SHIELD | Admitting: Neurology

## 2021-11-01 DIAGNOSIS — G35 Multiple sclerosis: Secondary | ICD-10-CM

## 2021-11-21 ENCOUNTER — Other Ambulatory Visit: Payer: Self-pay | Admitting: Neurology

## 2021-11-21 DIAGNOSIS — G35 Multiple sclerosis: Secondary | ICD-10-CM

## 2021-11-22 ENCOUNTER — Other Ambulatory Visit: Payer: Self-pay

## 2021-11-22 DIAGNOSIS — G35 Multiple sclerosis: Secondary | ICD-10-CM

## 2021-11-22 MED ORDER — AUBAGIO 14 MG PO TABS: ORAL_TABLET | ORAL | 0 refills | Status: DC

## 2021-12-15 ENCOUNTER — Other Ambulatory Visit: Payer: Self-pay | Admitting: Neurology

## 2021-12-15 DIAGNOSIS — G35 Multiple sclerosis: Secondary | ICD-10-CM

## 2021-12-19 ENCOUNTER — Other Ambulatory Visit: Payer: Self-pay | Admitting: *Deleted

## 2021-12-19 DIAGNOSIS — G35 Multiple sclerosis: Secondary | ICD-10-CM

## 2021-12-19 MED ORDER — AUBAGIO 14 MG PO TABS: ORAL_TABLET | ORAL | 0 refills | Status: DC

## 2022-04-11 ENCOUNTER — Other Ambulatory Visit: Payer: Self-pay | Admitting: Neurology

## 2022-04-11 DIAGNOSIS — G35 Multiple sclerosis: Secondary | ICD-10-CM

## 2022-04-26 ENCOUNTER — Other Ambulatory Visit: Payer: Self-pay | Admitting: Neurology

## 2022-04-26 DIAGNOSIS — G35 Multiple sclerosis: Secondary | ICD-10-CM

## 2022-05-14 ENCOUNTER — Other Ambulatory Visit (HOSPITAL_COMMUNITY): Payer: Self-pay | Admitting: Ophthalmology

## 2022-05-14 ENCOUNTER — Other Ambulatory Visit: Payer: Self-pay | Admitting: Ophthalmology

## 2022-05-14 DIAGNOSIS — H3582 Retinal ischemia: Secondary | ICD-10-CM

## 2022-05-21 ENCOUNTER — Other Ambulatory Visit: Payer: Self-pay | Admitting: Neurology

## 2022-05-21 DIAGNOSIS — G35 Multiple sclerosis: Secondary | ICD-10-CM

## 2022-05-22 ENCOUNTER — Ambulatory Visit: Payer: BLUE CROSS/BLUE SHIELD

## 2022-06-11 ENCOUNTER — Telehealth: Payer: Self-pay | Admitting: Family Medicine

## 2022-06-11 DIAGNOSIS — G35 Multiple sclerosis: Secondary | ICD-10-CM

## 2022-06-11 MED ORDER — TERIFLUNOMIDE 14 MG PO TABS: ORAL_TABLET | ORAL | 0 refills | Status: DC

## 2022-06-11 NOTE — Telephone Encounter (Signed)
Called and spoke w/ pt at [phone]. Rescheduled his appt to be sooner: 06/18/22 at 2:30pm with Dr. Felecia Shelling. Sent in 30 days supply but informed pt he must f/u to get any further refills.

## 2022-06-11 NOTE — Telephone Encounter (Signed)
[facility]) pt called [place] call in a refill for Teriflunomide 14 MG TABS .   I Deneise Lever) Informed [place] needed an appt to be able to get his refill and have schedule for 10/24/22

## 2022-06-11 NOTE — Telephone Encounter (Signed)
Called pt at [phone]. LVM for pt to call office. Called [phone]. LVM.   Needing to schedule sooner appt than January since he has not been seen since 03/2021.

## 2022-06-18 ENCOUNTER — Ambulatory Visit: Payer: BLUE CROSS/BLUE SHIELD | Admitting: Neurology

## 2022-06-18 ENCOUNTER — Encounter: Payer: Self-pay | Admitting: Neurology

## 2022-06-18 VITALS — BP 124/81 | HR 116 | Ht 71.0 in | Wt 236.4 lb

## 2022-06-18 DIAGNOSIS — R260 Ataxic gait: Secondary | ICD-10-CM | POA: Diagnosis not present

## 2022-06-18 DIAGNOSIS — Z79899 Other long term (current) drug therapy: Secondary | ICD-10-CM | POA: Diagnosis not present

## 2022-06-18 DIAGNOSIS — M62838 Other muscle spasm: Secondary | ICD-10-CM

## 2022-06-18 DIAGNOSIS — E559 Vitamin D deficiency, unspecified: Secondary | ICD-10-CM | POA: Diagnosis not present

## 2022-06-18 DIAGNOSIS — G35 Multiple sclerosis: Secondary | ICD-10-CM | POA: Diagnosis not present

## 2022-06-18 MED ORDER — BACLOFEN 10 MG PO TABS: ORAL_TABLET | ORAL | 11 refills | Status: DC

## 2022-06-18 NOTE — Progress Notes (Signed)
[facility]  PATIENT: [name] DOB: Oct 03, 1970  REFERRING DOCTOR OR PCP:    Polo Riley (PCP); Theora Master (neurology) SOURCE: Patient, notes from Drs Malvin Johns and [facility], laboratory and imaging reports, MRI images personally reviewed.  _________________________________   HISTORICAL  CHIEF COMPLAINT:  Chief Complaint  Patient presents with   Follow-up    RM 6, alone. Last seen 04/19/21.  MS DMT: Teriflunomide. Feels worse on generic of Aubagio. Balance worse.     HISTORY OF PRESENT ILLNESS:   [name] is a 51 y.o.  man with relapsing remitting multiple sclerosis    Update 06/18/2022: He is on Aubagio and tolerates it well.  He was switched to teriflunomide.   No definite exacerbation.   Gait is ok and no falls. He uses the bannister on stairs for safety.      His legs feel more stiff, esp after sitting a while and forst thing in am.       He can walk a 1/2 mile without stopping but could go further a couple years ago. Marland Kitchen    He denies numbness in his hands but not feet.   He feels strength is symmetric.  He denies problems with his bladder.   He saw ophthalmology and he may need surgery (he can't recall name of problem).     He notes some  fatigue most days, this is both physical and cognitive.   He has mild memory issues.    He has reduced focus.   He notes some anxiety > depression.   He sleeps well most night.   He snores but does not have pauses or gasps/snorts.   He denies much sleepiness during the day.   MS History: In December 2018, he noted chest pain, slurred speech and trouble with his gait.    An MRI of the brain was consistent with MS.   He followed up with Dr. Malvin Johns who ordered an LP.   CSF showed oligoclonal bands (6 unique bands) and he was diagnosed with MS and referred to Dr. Ronalee Red at [facility] who confirmed the diagnosis.  He started [place] in December 2019.   LABS February 2019 he had lab work to rule out hep B and  tuberculosis.  Lab work was negative.Varicella-zoster antibodies are positive.  ANA was negative.  Vitamin D was low 03/05/2018 at 17.4.   He is JCV antibody Low positive (0.43 on 06/04/2018).    IMAGING MRI of the brain dated 09/03/2017.    It shows multiple T2/flair hyperintense foci in the cerebellar hemispheres, left middle cerebellar peduncle, right pons and in the hemispheres in a pattern and configuration consistent with chronic demyelinating plaque associated with multiple sclerosis.  One focus in the right parieto-occipital region appears hyperintense on diffusion-weighted images and may be subacute   REVIEW OF SYSTEMS: Constitutional: No fevers, chills, sweats, or change in appetite.  He notes some fatigue, especially with heat Eyes: No visual changes, double vision, eye pain Ear, nose and throat: No hearing loss, ear pain, nasal congestion, sore throat Cardiovascular: No chest pain, palpitations Respiratory:  No shortness of breath at rest or with exertion.   No wheezes GastrointestinaI: No nausea, vomiting, diarrhea, abdominal pain, fecal incontinence Genitourinary:  No dysuria, urinary retention or frequency.  No nocturia. Musculoskeletal:  No neck pain, back pain Integumentary: No rash, pruritus.  He had a foot skin ulcer in the past Neurological: as above Psychiatric: No depression at this time.  No anxiety Endocrine: He has  diabetes mellitus.  The hemoglobin A1c was 7.2 on insulin 09/03/2018. Hematologic/Lymphatic:  No anemia, purpura, petechiae. Allergic/Immunologic: No itchy/runny eyes, nasal congestion, recent allergic reactions, rashes  ALLERGIES: No Known Allergies  HOME MEDICATIONS:  Current Outpatient Medications:    baclofen (LIORESAL) 10 MG tablet, 1/2 to 1 pill po tid, Disp: 90 each, Rfl: 11   busPIRone (BUSPAR) 5 MG tablet, TAKE 1 TABLET(5 MG) BY MOUTH EVERY DAY, Disp: , Rfl:    Continuous Blood Gluc Sensor (FREESTYLE LIBRE SENSOR SYSTEM) MISC, U UTD Q 10 DAYS.,  Disp: , Rfl: 11   hydrochlorothiazide (HYDRODIURIL) 12.5 MG tablet, Take 12.5 mg by mouth daily., Disp: , Rfl:    insulin aspart (NOVOLOG FLEXPEN) 100 UNIT/ML FlexPen, INJECT 55 UNITS UNDER THE SKIN THREE TIMES DAILY WITH MEALS, Disp: , Rfl:    insulin detemir (LEVEMIR) 100 UNIT/ML FlexPen, Inject 40 Units into the skin daily., Disp: , Rfl:    lisinopril (ZESTRIL) 10 MG tablet, Take 10 mg by mouth daily., Disp: , Rfl:    Multiple Vitamin (MULTIVITAMIN WITH MINERALS) TABS tablet, Take 1 tablet by mouth daily., Disp: 30 tablet, Rfl: 0   PARoxetine (PAXIL) 20 MG tablet, Take 40 mg by mouth daily. , Disp: , Rfl:    Teriflunomide 14 MG TABS, TAKE 1 TABLET BY MOUTH DAILY., Disp: 30 tablet, Rfl: 0   Vitamin D, Ergocalciferol, (DRISDOL) 1.25 MG (50000 UNIT) CAPS capsule, Take 1 capsule by mouth once a week for 26 weeks, Disp: 13 capsule, Rfl: 1  PAST MEDICAL HISTORY: Past Medical History:  Diagnosis Date   Anxiety    Diabetes mellitus without complication (HCC)    Hypertension    MS (multiple sclerosis) (HCC)     PAST SURGICAL HISTORY: Past Surgical History:  Procedure Laterality Date   AMPUTATION Left 10/02/2017   Procedure: AMPUTATION RAY-LEFT 5TH;  Surgeon: Linus Galas, DPM;  Location: [facility] ORS;  Service: Podiatry;  Laterality: Left;   CATARACT EXTRACTION, BILATERAL     TOE AMPUTATION      FAMILY HISTORY: No family history on file.  SOCIAL HISTORY:  Social History   Socioeconomic History   Marital status: Married    Spouse name: Quanell Loughney   Number of children: Not on file   Years of education: 12   Highest education level: Not on file  Occupational History   Occupation: [employer]  Tobacco Use   Smoking status: Never   Smokeless tobacco: Never  Vaping Use   Vaping Use: Never used  Substance and Sexual Activity   Alcohol use: No   Drug use: No   Sexual activity: Yes  Other Topics Concern   Not on file  Social History Narrative   Lives with wife   Caffeine use:  some soda   Social Determinants of Health   Financial Resource Strain: Not on file  Food Insecurity: Not on file  Transportation Needs: Not on file  Physical Activity: Not on file  Stress: Not on file  Social Connections: Not on file  Intimate Partner Violence: Not on file     PHYSICAL EXAM  Vitals:   06/18/22 1413  BP: 124/81  Pulse: (!) 116  Weight: 236 lb 6.4 oz (107.2 kg)  Height: 5\' 11"  (1.803 m)    Body mass index is 32.97 kg/m.   General: The patient is well-developed and well-nourished and in no acute distress  Skin: Extremities show edema and amputaiton of distal left foot and all toes on the left.    Neurologic Exam  Mental status: The patient is alert and oriented x 3 at the time of the examination. The patient has apparent normal recent and remote memory, with an apparently normal attention span and concentration ability.   Speech is normal.  Cranial nerves: Extraocular movements are full. Pupils are equal, round, and reactive to light and accomodation.  Color vision is symmetric.   Marland Kitchen There is good facial sensation to temperature  Bilaterally. Facial strength is normal.  Trapezius and sternocleidomastoid strength is normal. No dysarthria is noted.   No obvious hearing deficits are noted.  Motor:  Muscle bulk is normal.   Tone is normal. Strength is  5 / 5 in all 4 extremities.   Sensory: Sensory testing is intact to temperature, soft touch and vibration sensation in the arms.  He has reduced vibration iat the ankles.     Coordination: Finger-nose-finger and heel-to-shin is performed well.   Gait and station: Station is normal.   Gait is mildly wide with reduced stride and he cannot tandem walk. Romberg is borderline.   Reflexes: Deep tendon reflexes are symmetric and 3 at the arms, 2 at the knees and 1 at the ankles.       DIAGNOSTIC DATA (LABS, IMAGING, TESTING) - I reviewed patient records, labs, notes, testing and imaging myself where  available.  Lab Results  Component Value Date   WBC 13.4 (H) 08/23/2021   HGB 13.1 08/23/2021   HCT 39.9 08/23/2021   MCV 88.3 08/23/2021   PLT 288 08/23/2021      Component Value Date/Time   NA 124 (L) 08/23/2021 1252   NA 139 07/26/2020 1501   NA 131 (L) 08/22/2013 0454   K 4.1 08/23/2021 1252   K 4.6 08/22/2013 0454   CL 88 (L) 08/23/2021 1252   CL 99 08/22/2013 0454   CO2 23 08/23/2021 1252   CO2 28 08/22/2013 0454   GLUCOSE 420 (H) 08/23/2021 1252   GLUCOSE 131 (H) 08/22/2013 0454   BUN 35 (H) 08/23/2021 1252   BUN 23 07/26/2020 1501   BUN 31 (H) 08/22/2013 0454   CREATININE 1.68 (H) 08/23/2021 1252   CREATININE 1.50 (H) 08/23/2013 0415   CALCIUM 8.7 (L) 08/23/2021 1252   CALCIUM 9.3 08/22/2013 0454   PROT 9.0 (H) 08/23/2021 1252   PROT 7.3 04/19/2021 1147   PROT 9.5 (H) 08/21/2013 1153   ALBUMIN 3.1 (L) 08/23/2021 1252   ALBUMIN 4.4 04/19/2021 1147   ALBUMIN 3.2 (L) 08/21/2013 1153   AST 19 08/23/2021 1252   AST 21 08/21/2013 1153   ALT 12 08/23/2021 1252   ALT 29 08/21/2013 1153   ALKPHOS 63 08/23/2021 1252   ALKPHOS 83 08/21/2013 1153   BILITOT 1.2 08/23/2021 1252   BILITOT 0.4 04/19/2021 1147   BILITOT 0.7 08/21/2013 1153   GFRNONAA 49 (L) 08/23/2021 1252   GFRNONAA 56 (L) 08/23/2013 0415   GFRAA 75 07/26/2020 1501   GFRAA >60 08/23/2013 0415   No results found for: "CHOL", "HDL", "LDLCALC", "LDLDIRECT", "TRIG", "CHOLHDL" Lab Results  Component Value Date   HGBA1C 9.1 (H) 10/02/2017      ASSESSMENT AND PLAN  Multiple sclerosis (HCC) - Plan: MR BRAIN W WO CONTRAST, MR CERVICAL SPINE WO CONTRAST  Ataxic gait - Plan: MR BRAIN W WO CONTRAST, MR CERVICAL SPINE WO CONTRAST  High risk medication use  Vitamin D deficiency  Muscle spasm   1. Continue teriflunomide as his DMT.  Check an MRI of the brain and cervical spine to determine  if there is any subclinical activity.  If present, consider a switch to a more efficacious disease modifying  therapy. 2.   Stay active and exercise as tolerated  3.  Use bannister with stairs and take extra care on uneven surfaces. 4.  His gait issues are multifactorial.  Besides the MS.  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   5.  Return in 6 months or sooner if there are new or worsening neurologic symptoms.  Jawon Dipiero A. Felecia Shelling, MD, [facility] 10/12/4172, 0:81 PM Certified in Neurology, Clinical Neurophysiology, Sleep Medicine, Pain Medicine and Neuroimaging  [facility] [address] [place], [place] [address] [phone]

## 2022-06-27 ENCOUNTER — Encounter: Payer: Self-pay | Admitting: Emergency Medicine

## 2022-06-27 ENCOUNTER — Other Ambulatory Visit: Payer: Self-pay

## 2022-06-27 DIAGNOSIS — N390 Urinary tract infection, site not specified: Secondary | ICD-10-CM | POA: Insufficient documentation

## 2022-06-27 DIAGNOSIS — E119 Type 2 diabetes mellitus without complications: Secondary | ICD-10-CM | POA: Diagnosis not present

## 2022-06-27 DIAGNOSIS — I10 Essential (primary) hypertension: Secondary | ICD-10-CM | POA: Diagnosis not present

## 2022-06-27 DIAGNOSIS — R339 Retention of urine, unspecified: Secondary | ICD-10-CM | POA: Diagnosis present

## 2022-06-27 NOTE — ED Triage Notes (Signed)
Pt to ED from home c/o urinary retention x2 weeks.  States believes he has enlarged prostate, has had to urinate every 20 minutes but getting very little out when he goes.  Pressure and pain in bladder.

## 2022-06-28 ENCOUNTER — Emergency Department
Admission: EM | Admit: 2022-06-28 | Discharge: 2022-06-28 | Disposition: A | Discharge status: Home / Self Care | Payer: BLUE CROSS/BLUE SHIELD | Attending: Emergency Medicine | Admitting: Emergency Medicine

## 2022-06-28 DIAGNOSIS — N390 Urinary tract infection, site not specified: Secondary | ICD-10-CM

## 2022-06-28 LAB — URINALYSIS, ROUTINE W REFLEX MICROSCOPIC
Bacteria, UA: NONE SEEN
Bilirubin Urine: NEGATIVE
Glucose, UA: >=500 mg/dL — AB
Hgb urine dipstick: NEGATIVE
Ketones, ur: NEGATIVE mg/dL
Nitrite: POSITIVE — AB
Protein, ur: 100 mg/dL — AB
Specific Gravity, Urine: 1.031 — ABNORMAL HIGH (ref 1.005–1.030)
WBC, UA: >50 WBC/hpf — ABNORMAL HIGH (ref 0–5)
pH: 5 (ref 5.0–8.0)

## 2022-06-28 MED ORDER — CEPHALEXIN 500 MG PO CAPS
500.0000 mg | ORAL_CAPSULE | Freq: Once | ORAL | Status: AC
  Administered 2022-06-28: 500 mg via ORAL
  Filled 2022-06-28: qty 1

## 2022-06-28 MED ORDER — CEPHALEXIN 500 MG PO CAPS: 500.0000 mg | ORAL_CAPSULE | Freq: Four times a day (QID) | ORAL | 0 refills | Status: AC

## 2022-06-28 NOTE — Discharge Instructions (Addendum)
Your urine sample indicates that you likely have a urinary tract infection.  This is likely contributing to your symptoms.  Please stop taking the Benadryl.  Please take the antibiotic 4 times a day for the next 7 days.  Please keep your appointment with urology.  If you are unable to urinate develop fevers or worsening abdominal pain please return to the emergency department.

## 2022-06-28 NOTE — ED Provider Notes (Signed)
[facility] Provider Note    Event Date/Time   First MD Initiated Contact with Patient 06/28/22 0001     (approximate)   History   Urinary Retention (/)   HPI  [name] is a 51 y.o. male past medical history of diabetes hypertension anxiety and multiple sclerosis who presents with difficulty urinating.  Patient notes that over the last 2 weeks he has had urinary frequency and difficulty urinating going only small volumes at a time.  Feels like he does not empty his bladder when he goes.  He will have significant pressure and discomfort in the suprapubic region and then when he goes a little bit it is relieved momentarily.  He is concerned about an enlarged prostate has no history of BPH or any issues urinating in the past.  Denies any new numbness or weakness associated with his MS.  He denies fevers or chills back pain nausea or vomiting.  Has been taking Benadryl for rash on his arm last took this last night.     Past Medical History:  Diagnosis Date   Anxiety    Diabetes mellitus without complication (HCC)    Hypertension    MS (multiple sclerosis) (HCC)     Patient Active Problem List   Diagnosis Date Noted   Muscle spasm 06/18/2022   Vitamin D deficiency 07/26/2020   Other fatigue 07/26/2020   Urinary urgency 07/26/2020   Multiple sclerosis (HCC) 09/10/2018   Ataxic gait 09/10/2018   High risk medication use 09/10/2018   Osteomyelitis of ankle or foot, acute (HCC) 10/01/2017   Varicose veins with pain 07/06/2017   Diabetes mellitus type 2 in obese (HCC) 03/13/2017   Peripheral artery disease (HCC) 03/13/2017   Chronic venous insufficiency 03/13/2017   Hyperlipidemia 03/13/2017   Essential hypertension 03/13/2017     Physical Exam  Triage Vital Signs: ED Triage Vitals [06/27/22 2357]  Enc Vitals Group     BP (!) 139/96     Pulse Rate (!) 110     Resp 16     Temp 97.7 F (36.5 C)     Temp Source Oral     SpO2 99 %     Weight  235 lb (106.6 kg)     Height 5\' 11"  (1.803 m)     Head Circumference      Peak Flow      Pain Score 4     Pain Loc      Pain Edu?      Excl. in [place]?     Most recent vital signs: Vitals:   06/27/22 2357 06/28/22 0124  BP: (!) 139/96 (!) 136/95  Pulse: (!) 110 95  Resp: 16 18  Temp: 97.7 F (36.5 C) 97.8 F (36.6 C)  SpO2: 99% 99%     General: Awake, no distress.  CV:  Good peripheral perfusion.  Resp:  Normal effort.  Abd:  No distention.  Abdomen is soft nontender no palpable bladder Neuro:             Awake, Alert, Oriented x 3  Other:  Normal external genitalia   ED Results / Procedures / Treatments  Labs (all labs ordered are listed, but only abnormal results are displayed) Labs Reviewed  URINALYSIS, ROUTINE W REFLEX MICROSCOPIC - Abnormal; Notable for the following components:      Result Value   Color, Urine YELLOW (*)    APPearance CLOUDY (*)    Specific Gravity, Urine 1.031 (*)  Glucose, UA >=500 (*)    Protein, ur 100 (*)    Nitrite POSITIVE (*)    Leukocytes,Ua MODERATE (*)    WBC, UA >50 (*)    All other components within normal limits  URINE CULTURE     EKG     RADIOLOGY    PROCEDURES:  Critical Care performed: No  Procedures    MEDICATIONS ORDERED IN ED: Medications  cephALEXin (KEFLEX) capsule 500 mg (500 mg Oral Given 06/28/22 0127)     IMPRESSION / MDM / ASSESSMENT AND PLAN / ED COURSE  I reviewed the triage vital signs and the nursing notes.                              Patient's presentation is most consistent with acute complicated illness / injury requiring diagnostic workup.  Differential diagnosis includes, but is not limited to, urinary retention secondary to BPH, neurogenic bladder secondary to diabetes or MS, UTI  Patient is a 51 year old male with history of MS who presents with difficulty urinating for 2 weeks.  He has urinary frequency and only voiding small volumes at a time feels like he has to strain to  get urine out and does not feel like he is completely emptying his bladder.  Does have associated dysuria.  No fevers chills no significant abdominal pain aside from the pelvic discomfort when he needs to urinate.  He denies any new numbness or weakness associated with his MS and denies any history of urinary incontinence.  On exam overall he looks well after genitalia exam is normal and I do not feel a palpable bladder.  He was able provide a urine sample.  Will obtain postvoid residual.  If significantly elevated may need Foley catheter.    He does have a urology appointment on October 23.  I am suspicious of whether the Benadryl is also contributing to his retention I advised that he stop taking this.  Patient's postvoid residual is 120 which is overall reassuring that he is not having significant urinary retention.  I have thus lower suspicion that this is related to his MS.  Plan to start on Flomax.  Urinalysis is consistent with UTI.  I have sent urine culture.  We will treat with 7 days of Keflex.  Encouraged urology follow-up.  We discussed return precautions.  FINAL CLINICAL IMPRESSION(S) / ED DIAGNOSES   Final diagnoses:  Urinary tract infection without hematuria, site unspecified     Rx / DC Orders   ED Discharge Orders          Ordered    cephALEXin (KEFLEX) 500 MG capsule  4 times daily        06/28/22 0137             Note:  This document was prepared using Dragon voice recognition software and may include unintentional dictation errors.   Rada Hay, MD 06/28/22 [id number]

## 2022-06-28 NOTE — ED Provider Notes (Incomplete)
[facility] Provider Note    Event Date/Time   First MD Initiated Contact with Patient 06/28/22 0001     (approximate)   History   Urinary Retention (/)   HPI  [name] is a 51 y.o. male past medical history of diabetes hypertension anxiety and multiple sclerosis who presents with urinary retention.     Past Medical History:  Diagnosis Date  . Anxiety   . Diabetes mellitus without complication ([place])   . Hypertension   . MS (multiple sclerosis) [facility])     Patient Active Problem List   Diagnosis Date Noted  . Muscle spasm 06/18/2022  . Vitamin D deficiency 07/26/2020  . Other fatigue 07/26/2020  . Urinary urgency 07/26/2020  . Multiple sclerosis ([place]) 09/10/2018  . Ataxic gait 09/10/2018  . High risk medication use 09/10/2018  . Osteomyelitis of ankle or foot, acute ([place]) 10/01/2017  . Varicose veins with pain 07/06/2017  . Diabetes mellitus type 2 in obese ([place]) 03/13/2017  . Peripheral artery disease ([place]) 03/13/2017  . Chronic venous insufficiency 03/13/2017  . Hyperlipidemia 03/13/2017  . Essential hypertension 03/13/2017     Physical Exam  Triage Vital Signs: ED Triage Vitals [06/27/22 2357]  Enc Vitals Group     BP (!) 139/96     Pulse Rate (!) 110     Resp 16     Temp 97.7 F (36.5 C)     Temp Source Oral     SpO2 99 %     Weight 235 lb (106.6 kg)     Height 5\' 11"  (1.803 m)     Head Circumference      Peak Flow      Pain Score 4     Pain Loc      Pain Edu?      Excl. in [place]?     Most recent vital signs: Vitals:   06/27/22 2357  BP: (!) 139/96  Pulse: (!) 110  Resp: 16  Temp: 97.7 F (36.5 C)  SpO2: 99%     General: Awake, no distress. *** CV:  Good peripheral perfusion. *** Resp:  Normal effort. *** Abd:  No distention. *** Neuro:             Awake, Alert, Oriented x 3 *** Other:  ***   ED Results / Procedures / Treatments  Labs (all labs ordered are listed, but only abnormal results are  displayed) Labs Reviewed  URINALYSIS, ROUTINE W REFLEX MICROSCOPIC     EKG  ***   RADIOLOGY ***   PROCEDURES:  Critical Care performed: {CriticalCareYesNo:19197::"Yes, see critical care procedure note(s)","No"}  Procedures {If the patient is on the monitor, remove the brackets and asterisks. Otherwise delete the sentence below:1} The patient is on the cardiac monitor to evaluate for evidence of arrhythmia and/or significant heart rate changes.   MEDICATIONS ORDERED IN ED: Medications - No data to display   IMPRESSION / MDM / [place] / ED COURSE  I reviewed the triage vital signs and the nursing notes.                              Patient's presentation is most consistent with {EM COPA:27473}  Differential diagnosis includes, but is not limited to, ***       FINAL CLINICAL IMPRESSION(S) / ED DIAGNOSES   Final diagnoses:  None     Rx / DC Orders   ED  Discharge Orders     None        Note:  This document was prepared using Dragon voice recognition software and may include unintentional dictation errors.

## 2022-06-28 NOTE — ED Notes (Signed)
DC instructions given verbally and in writing, understanding voiced, signature obtained.  Pt taken to lobby in wheelchair to await friend to pick him up.

## 2022-06-28 NOTE — ED Notes (Signed)
Bladder scanned and noted 125ml residual 15 mins after voiding.

## 2022-06-29 ENCOUNTER — Other Ambulatory Visit: Payer: Self-pay

## 2022-06-29 ENCOUNTER — Telehealth: Payer: Self-pay | Admitting: Neurology

## 2022-06-29 DIAGNOSIS — Z794 Long term (current) use of insulin: Secondary | ICD-10-CM | POA: Insufficient documentation

## 2022-06-29 DIAGNOSIS — R000 Tachycardia, unspecified: Secondary | ICD-10-CM | POA: Insufficient documentation

## 2022-06-29 DIAGNOSIS — E871 Hypo-osmolality and hyponatremia: Secondary | ICD-10-CM | POA: Diagnosis not present

## 2022-06-29 DIAGNOSIS — E1165 Type 2 diabetes mellitus with hyperglycemia: Secondary | ICD-10-CM | POA: Diagnosis not present

## 2022-06-29 DIAGNOSIS — I10 Essential (primary) hypertension: Secondary | ICD-10-CM | POA: Diagnosis not present

## 2022-06-29 DIAGNOSIS — R350 Frequency of micturition: Secondary | ICD-10-CM | POA: Diagnosis present

## 2022-06-29 DIAGNOSIS — Z79899 Other long term (current) drug therapy: Secondary | ICD-10-CM | POA: Diagnosis not present

## 2022-06-29 DIAGNOSIS — N390 Urinary tract infection, site not specified: Secondary | ICD-10-CM | POA: Diagnosis not present

## 2022-06-29 DIAGNOSIS — R109 Unspecified abdominal pain: Secondary | ICD-10-CM | POA: Insufficient documentation

## 2022-06-29 LAB — CBC
HCT: 39 % (ref 39.0–52.0)
Hemoglobin: 13.2 g/dL (ref 13.0–17.0)
MCH: 29.5 pg (ref 26.0–34.0)
MCHC: 33.8 g/dL (ref 30.0–36.0)
MCV: 87.2 fL (ref 80.0–100.0)
Platelets: 372 10*3/uL (ref 150–400)
RBC: 4.47 MIL/uL (ref 4.22–5.81)
RDW: 12.8 % (ref 11.5–15.5)
WBC: 10 10*3/uL (ref 4.0–10.5)
nRBC: 0 % (ref 0.0–0.2)

## 2022-06-29 LAB — LACTIC ACID, PLASMA: Lactic Acid, Venous: 1.1 mmol/L (ref 0.5–1.9)

## 2022-06-29 LAB — LIPASE, BLOOD: Lipase: 34 U/L (ref 11–51)

## 2022-06-29 LAB — COMPREHENSIVE METABOLIC PANEL
ALT: 11 U/L (ref 0–44)
AST: 16 U/L (ref 15–41)
Albumin: 3.5 g/dL (ref 3.5–5.0)
Alkaline Phosphatase: 77 U/L (ref 38–126)
Anion gap: 11 (ref 5–15)
BUN: 22 mg/dL — ABNORMAL HIGH (ref 6–20)
CO2: 26 mmol/L (ref 22–32)
Calcium: 9.1 mg/dL (ref 8.9–10.3)
Chloride: 94 mmol/L — ABNORMAL LOW (ref 98–111)
Creatinine, Ser: 1.2 mg/dL (ref 0.61–1.24)
GFR, Estimated: >60 mL/min (ref >60)
Glucose, Bld: 412 mg/dL — ABNORMAL HIGH (ref 70–99)
Potassium: 3.9 mmol/L (ref 3.5–5.1)
Sodium: 131 mmol/L — ABNORMAL LOW (ref 135–145)
Total Bilirubin: 1 mg/dL (ref 0.3–1.2)
Total Protein: 8.9 g/dL — ABNORMAL HIGH (ref 6.5–8.1)

## 2022-06-29 NOTE — Telephone Encounter (Signed)
[employer] auth pending case #[id number]  Anticipated Determination Date: 07/03/2022  Submitted for [employer].

## 2022-06-29 NOTE — ED Triage Notes (Signed)
Pt reports he was diagnosed with a UTI on Wednesday and has been compliant with his abx but states his dysuria has not gone away. He also reports left side abd pain onset today.

## 2022-06-30 ENCOUNTER — Emergency Department
Admission: EM | Admit: 2022-06-30 | Discharge: 2022-06-30 | Disposition: A | Discharge status: Home / Self Care | Payer: BLUE CROSS/BLUE SHIELD | Attending: Emergency Medicine | Admitting: Emergency Medicine

## 2022-06-30 ENCOUNTER — Emergency Department: Payer: BLUE CROSS/BLUE SHIELD

## 2022-06-30 ENCOUNTER — Telehealth: Payer: Self-pay | Admitting: Emergency Medicine

## 2022-06-30 DIAGNOSIS — R339 Retention of urine, unspecified: Secondary | ICD-10-CM

## 2022-06-30 DIAGNOSIS — N390 Urinary tract infection, site not specified: Secondary | ICD-10-CM

## 2022-06-30 DIAGNOSIS — R739 Hyperglycemia, unspecified: Secondary | ICD-10-CM

## 2022-06-30 DIAGNOSIS — R300 Dysuria: Secondary | ICD-10-CM

## 2022-06-30 LAB — URINALYSIS, ROUTINE W REFLEX MICROSCOPIC
Bilirubin Urine: NEGATIVE
Glucose, UA: >=500 mg/dL — AB
Hgb urine dipstick: NEGATIVE
Ketones, ur: NEGATIVE mg/dL
Nitrite: NEGATIVE
Protein, ur: 30 mg/dL — AB
Specific Gravity, Urine: 1.028 (ref 1.005–1.030)
WBC, UA: >50 WBC/hpf — ABNORMAL HIGH (ref 0–5)
pH: 5 (ref 5.0–8.0)

## 2022-06-30 LAB — URINE CULTURE: Culture: >=100000 — AB

## 2022-06-30 LAB — CBG MONITORING, ED
Glucose-Capillary: 430 mg/dL — ABNORMAL HIGH (ref 70–99)
Glucose-Capillary: 352 mg/dL — ABNORMAL HIGH (ref 70–99)

## 2022-06-30 MED ORDER — LIDOCAINE HCL URETHRAL/MUCOSAL 2 % EX GEL
1.0000 | Freq: Once | CUTANEOUS | Status: AC
  Administered 2022-06-30: 1 via URETHRAL
  Filled 2022-06-30: qty 10

## 2022-06-30 MED ORDER — SODIUM CHLORIDE 0.9 % IV SOLN
1.0000 g | Freq: Once | INTRAVENOUS | Status: AC
  Administered 2022-06-30: 1 g via INTRAVENOUS
  Filled 2022-06-30: qty 10

## 2022-06-30 MED ORDER — PHENAZOPYRIDINE HCL 200 MG PO TABS: 200.0000 mg | ORAL_TABLET | Freq: Three times a day (TID) | ORAL | 0 refills | Status: DC | PRN

## 2022-06-30 MED ORDER — DOXYCYCLINE HYCLATE 50 MG PO CAPS: 100.0000 mg | ORAL_CAPSULE | Freq: Two times a day (BID) | ORAL | 0 refills | Status: DC

## 2022-06-30 MED ORDER — DOXYCYCLINE HYCLATE 50 MG PO CAPS: 100.0000 mg | ORAL_CAPSULE | Freq: Two times a day (BID) | ORAL | 0 refills | Status: AC

## 2022-06-30 MED ORDER — SODIUM CHLORIDE 0.9 % IV BOLUS
1000.0000 mL | Freq: Once | INTRAVENOUS | Status: AC
  Administered 2022-06-30: 1000 mL via INTRAVENOUS

## 2022-06-30 MED ORDER — KETOROLAC TROMETHAMINE 30 MG/ML IJ SOLN
15.0000 mg | Freq: Once | INTRAMUSCULAR | Status: AC
  Administered 2022-06-30: 15 mg via INTRAVENOUS
  Filled 2022-06-30: qty 1

## 2022-06-30 MED ORDER — DIAZEPAM 5 MG/ML IJ SOLN
2.0000 mg | Freq: Once | INTRAMUSCULAR | Status: AC
  Administered 2022-06-30: 2 mg via INTRAVENOUS
  Filled 2022-06-30: qty 2

## 2022-06-30 MED ORDER — PHENAZOPYRIDINE HCL 200 MG PO TABS: 200.0000 mg | ORAL_TABLET | Freq: Three times a day (TID) | ORAL | 0 refills | Status: AC | PRN

## 2022-06-30 MED ORDER — PHENAZOPYRIDINE HCL 200 MG PO TABS
200.0000 mg | ORAL_TABLET | Freq: Once | ORAL | Status: AC
  Administered 2022-06-30: 200 mg via ORAL
  Filled 2022-06-30: qty 1

## 2022-06-30 MED ORDER — INSULIN ASPART 100 UNIT/ML IJ SOLN
10.0000 [IU] | Freq: Once | INTRAMUSCULAR | Status: AC
  Administered 2022-06-30: 10 [IU] via SUBCUTANEOUS
  Filled 2022-06-30: qty 1

## 2022-06-30 MED ORDER — HYDROMORPHONE HCL 1 MG/ML IJ SOLN
0.5000 mg | Freq: Once | INTRAMUSCULAR | Status: AC
  Administered 2022-06-30: 0.5 mg via INTRAVENOUS
  Filled 2022-06-30: qty 0.5

## 2022-06-30 NOTE — ED Notes (Signed)
RN educated pt on use of leg bag and night bag and foley care.

## 2022-06-30 NOTE — ED Provider Notes (Signed)
[facility] Provider Note    Event Date/Time   First MD Initiated Contact with Patient 06/30/22 0104     (approximate)   History   Urinary Frequency   HPI  [name] is a 51 y.o. male who returns to the ED from home with a chief complaint of dysuria.  Patient was seen in the ED 06/27/2022 and diagnosed with UTI; placed on Keflex which he states he is compliant with.  Returns for complaints of persistent dysuria.  Also notes right flank pain today.  History of kidney stones remotely not requiring lithotripsy or stenting.  Endorses chills without fever.  Denies chest pain, shortness of breath, nausea, vomiting or diarrhea.  Denies testicular pain or swelling.  Denies urethral discharge or STD concerns.     Past Medical History   Past Medical History:  Diagnosis Date   Anxiety    Diabetes mellitus without complication ([place])    Hypertension    MS (multiple sclerosis) ([place])      Active Problem List   Patient Active Problem List   Diagnosis Date Noted   Muscle spasm 06/18/2022   Vitamin D deficiency 07/26/2020   Other fatigue 07/26/2020   Urinary urgency 07/26/2020   Multiple sclerosis ([place]) 09/10/2018   Ataxic gait 09/10/2018   High risk medication use 09/10/2018   Osteomyelitis of ankle or foot, acute ([place]) 10/01/2017   Varicose veins with pain 07/06/2017   Diabetes mellitus type 2 in obese ([place]) 03/13/2017   Peripheral artery disease ([place]) 03/13/2017   Chronic venous insufficiency 03/13/2017   Hyperlipidemia 03/13/2017   Essential hypertension 03/13/2017     Past Surgical History   Past Surgical History:  Procedure Laterality Date   AMPUTATION Left 10/02/2017   Procedure: AMPUTATION RAY-LEFT 5TH;  Surgeon: Sharlotte Alamo, DPM;  Location: [facility] ORS;  Service: Podiatry;  Laterality: Left;   CATARACT EXTRACTION, BILATERAL     TOE AMPUTATION       Home Medications   Prior to Admission medications   Medication Sig Start Date End Date  Taking? Authorizing Provider  doxycycline (VIBRAMYCIN) 50 MG capsule Take 2 capsules (100 mg total) by mouth 2 (two) times daily. 06/30/22  Yes Paulette Blanch, MD  phenazopyridine (PYRIDIUM) 200 MG tablet Take 1 tablet (200 mg total) by mouth 3 (three) times daily as needed for pain. 06/30/22  Yes Paulette Blanch, MD  baclofen (LIORESAL) 10 MG tablet 1/2 to 1 pill po tid 06/18/22   Sater, Nanine Means, MD  busPIRone (BUSPAR) 5 MG tablet TAKE 1 TABLET(5 MG) BY MOUTH EVERY DAY 04/10/21   [provider]  cephALEXin (KEFLEX) 500 MG capsule Take 1 capsule (500 mg total) by mouth 4 (four) times daily for 7 days. 06/28/22 07/05/22  Rada Hay, MD  Continuous Blood Gluc Sensor (FREESTYLE LIBRE SENSOR SYSTEM) MISC U UTD Q 10 DAYS. 06/07/17   [provider]  hydrochlorothiazide (HYDRODIURIL) 12.5 MG tablet Take 12.5 mg by mouth daily. 03/30/21   [provider]  insulin aspart (NOVOLOG FLEXPEN) 100 UNIT/ML FlexPen INJECT 55 UNITS UNDER THE SKIN THREE TIMES DAILY WITH MEALS 03/09/21   [provider]  insulin detemir (LEVEMIR) 100 UNIT/ML FlexPen Inject 40 Units into the skin daily. 06/30/18   [provider]  lisinopril (ZESTRIL) 10 MG tablet Take 10 mg by mouth daily. 03/30/21   [provider]  Multiple Vitamin (MULTIVITAMIN WITH MINERALS) TABS tablet Take 1 tablet by mouth daily. 10/05/17   Epifanio Lesches,  MD  PARoxetine (PAXIL) 20 MG tablet Take 40 mg by mouth daily.  03/06/17   [provider]  Teriflunomide 14 MG TABS TAKE 1 TABLET BY MOUTH DAILY. 06/11/22   Sater, Pearletha Furl, MD  Vitamin D, Ergocalciferol, (DRISDOL) 1.25 MG (50000 UNIT) CAPS capsule Take 1 capsule by mouth once a week for 26 weeks 07/28/20   Sater, Pearletha Furl, MD     Allergies  Shellfish allergy   Family History  No family history on file.   Physical Exam  Triage Vital Signs: ED Triage Vitals  Enc Vitals Group     BP 06/29/22 2144 127/87     Pulse Rate 06/29/22 2144 (!)  112     Resp 06/29/22 2144 18     Temp 06/29/22 2144 97.6 F (36.4 C)     Temp Source 06/29/22 2144 Oral     SpO2 06/29/22 2144 99 %     Weight 06/29/22 2142 235 lb (106.6 kg)     Height 06/29/22 2142 5\' 11"  (1.803 m)     Head Circumference --      Peak Flow --      Pain Score 06/29/22 2142 5     Pain Loc --      Pain Edu? --      Excl. in GC? --     Updated Vital Signs: BP 136/75   Pulse 85   Temp 97.6 F (36.4 C) (Oral)   Resp 14   Ht 5\' 11"  (1.803 m)   Wt 106.6 kg   SpO2 96%   BMI 32.78 kg/m    General: Awake, no distress.  CV:  RRR.  Good peripheral perfusion.  Resp:  Normal effort.  CTA B. Abd:  Nontender to light or deep palpation.  No CVAT.  No distention.  Other:  No truncal vesicles.   ED Results / Procedures / Treatments  Labs (all labs ordered are listed, but only abnormal results are displayed) Labs Reviewed  COMPREHENSIVE METABOLIC PANEL - Abnormal; Notable for the following components:      Result Value   Sodium 131 (*)    Chloride 94 (*)    Glucose, Bld 412 (*)    BUN 22 (*)    Total Protein 8.9 (*)    All other components within normal limits  URINALYSIS, ROUTINE W REFLEX MICROSCOPIC - Abnormal; Notable for the following components:   Color, Urine YELLOW (*)    APPearance CLOUDY (*)    Glucose, UA >=500 (*)    Protein, ur 30 (*)    Leukocytes,Ua LARGE (*)    WBC, UA >50 (*)    Bacteria, UA RARE (*)    All other components within normal limits  CBG MONITORING, ED - Abnormal; Notable for the following components:   Glucose-Capillary 430 (*)    All other components within normal limits  CBG MONITORING, ED - Abnormal; Notable for the following components:   Glucose-Capillary 352 (*)    All other components within normal limits  LIPASE, BLOOD  CBC  LACTIC ACID, PLASMA  LACTIC ACID, PLASMA     EKG  None   RADIOLOGY I have independently visualized and interpreted patient's CT scan as well as noted the radiology interpretation:  CT  renal stone study: The megaly with possible prostatitis  Official radiology report(s): CT Renal Stone Study  Result Date: 06/30/2022 CLINICAL DATA:  Dysuria, left abdominal pain, recently diagnosed with UTI EXAM: CT ABDOMEN AND PELVIS WITHOUT CONTRAST TECHNIQUE: Multidetector CT imaging of  the abdomen and pelvis was performed following the standard protocol without IV contrast. RADIATION DOSE REDUCTION: This exam was performed according to the departmental dose-optimization program which includes automated exposure control, adjustment of the mA and/or kV according to patient size and/or use of iterative reconstruction technique. COMPARISON:  02/08/2020 FINDINGS: Motion degraded images. Lower chest: Lung bases are clear. Hepatobiliary: Liver is within normal limits. Gallbladder is unremarkable. No intrahepatic or extrahepatic ductal dilatation. Pancreas: Within normal limits. Spleen: Within normal limits. Adrenals/Urinary Tract: Adrenal glands are within normal limits. Kidneys are within normal limits. No renal, ureteral, or bladder calculi. No hydronephrosis. Bladder is within normal limits. Stomach/Bowel: Stomach is within normal limits. No evidence of bowel obstruction. Normal appendix (series 2/image 62). No colonic wall thickening or inflammatory changes. Vascular/Lymphatic: No evidence of abdominal aortic aneurysm. No suspicious abdominopelvic lymphadenopathy. Reproductive: Prostatomegaly, suggesting BPH. Very mild stranding along the prostate (series 2/image 87) at least raises the possibility of prostatitis. No associated fluid collection on unenhanced CT. Other: No abdominopelvic ascites. Musculoskeletal: Degenerative changes of the visualized thoracolumbar spine. IMPRESSION: Motion degraded images. Prostatomegaly, suggesting BPH. Associated mild stranding along the prostate, raising the possibility of prostatitis. No associated fluid collection on unenhanced CT. Otherwise negative CT abdomen/pelvis.  Electronically Signed   By: Charline Bills M.D.   On: 06/30/2022 02:23     PROCEDURES:  Critical Care performed: No  .1-3 Lead EKG Interpretation  Performed by: Irean Hong, MD Authorized by: Irean Hong, MD     Interpretation: normal     ECG rate:  90   ECG rate assessment: normal     Rhythm: sinus rhythm     Ectopy: none     Conduction: normal   Comments:     Patient placed on cardiac monitor to evaluate for arrhythmias    MEDICATIONS ORDERED IN ED: Medications  sodium chloride 0.9 % bolus 1,000 mL (0 mLs Intravenous Stopped 06/30/22 0404)  insulin aspart (novoLOG) injection 10 Units (10 Units Subcutaneous Given 06/30/22 0215)  phenazopyridine (PYRIDIUM) tablet 200 mg (200 mg Oral Given 06/30/22 0221)  cefTRIAXone (ROCEPHIN) 1 g in sodium chloride 0.9 % 100 mL IVPB (0 g Intravenous Stopped 06/30/22 0405)  diazepam (VALIUM) injection 2 mg (2 mg Intravenous Given 06/30/22 0215)  ketorolac (TORADOL) 30 MG/ML injection 15 mg (15 mg Intravenous Given 06/30/22 0409)  HYDROmorphone (DILAUDID) injection 0.5 mg (0.5 mg Intravenous Given 06/30/22 0409)  lidocaine (XYLOCAINE) 2 % jelly 1 Application (1 Application Urethral Given 06/30/22 0430)     IMPRESSION / MDM / ASSESSMENT AND PLAN / ED COURSE  I reviewed the triage vital signs and the nursing notes.                             51 year old male who returns to the ED for persistent dysuria; on Keflex x2 days for UTI. Differential diagnosis includes, but is not limited to, acute appendicitis, renal colic, testicular torsion, urinary tract infection/pyelonephritis, prostatitis,  epididymitis, diverticulitis, small bowel obstruction or ileus, colitis, abdominal aortic aneurysm, gastroenteritis, hernia, etc. I have personally reviewed patient's records and note his ED visit from 06/27/2022.  Patient's presentation is most consistent with acute presentation with potential threat to life or bodily function.  The patient is on the cardiac  monitor to evaluate for evidence of arrhythmia and/or significant heart rate changes.  Laboratory results demonstrate normal WBC of 10, mild hyponatremia sodium 131, hyperglycemia without an vision of anion gap.  Large  leukocytes and greater than 50 WBC in urine.  Lactic acid is negative.  Urine culture speciation is pending from 10/4 but is growing greater than 100,000 CFU Staph aureus.  Will initiate IV fluid resuscitation and insulin for hyperglycemia, 1 g IV Rocephin for UTI, Pyridium for dysuria and Valium for probable bladder spasms.  Will reassess.  Clinical Course as of 06/30/22 0504  Sat Jun 30, 2022  0403 FSBS 352.  Updated patient on CT scan demonstrating probable prostatitis.  Will change his antibiotic to doxycycline.  Patient currently standing, complains of urinary urgency and pain, tachycardic to 120.  Will obtain bladder scan, administer IV Toradol and Dilaudid and reassess. [JS]  0415 Bladder scan >374mL [JS]  0500 Significant relief after Foley catheter placement.  Patient has urology appointment on October 23, he will call on Monday morning for sooner appointment.  Will discharge home with Foley catheter in place and change antibiotic to doxycycline.  Strict return precautions given.  Patient verbalizes understanding and agrees with plan of care. [JS]    Clinical Course User Index [JS] Irean Hong, MD     FINAL CLINICAL IMPRESSION(S) / ED DIAGNOSES   Final diagnoses:  Dysuria  Hyperglycemia  Lower urinary tract infectious disease  Urinary retention     Rx / DC Orders   ED Discharge Orders          Ordered    doxycycline (VIBRAMYCIN) 50 MG capsule  2 times daily        06/30/22 0502    phenazopyridine (PYRIDIUM) 200 MG tablet  3 times daily PRN        06/30/22 0502             Note:  This document was prepared using Dragon voice recognition software and may include unintentional dictation errors.   Irean Hong, MD 06/30/22 [phone]

## 2022-06-30 NOTE — Telephone Encounter (Signed)
Pt requesting prescriptions sent to pharmacy

## 2022-06-30 NOTE — Discharge Instructions (Addendum)
Change antibiotic to Doxycycline 100mg  twice daily x7 days.  Care for your Foley catheter as instructed.  Return to the ER for worsening symptoms, persistent vomiting, fever or other concerns.

## 2022-06-30 NOTE — Telephone Encounter (Signed)
Sent to pharm ....

## 2022-06-30 NOTE — ED Notes (Signed)
Bladder scan performed at bedside.  327 mL volume noted in bladder

## 2022-07-06 ENCOUNTER — Telehealth: Payer: Self-pay | Admitting: Neurology

## 2022-07-06 DIAGNOSIS — G35 Multiple sclerosis: Secondary | ICD-10-CM

## 2022-07-06 MED ORDER — TERIFLUNOMIDE 14 MG PO TABS: ORAL_TABLET | ORAL | 11 refills | Status: AC

## 2022-07-06 NOTE — Telephone Encounter (Signed)
E-scribed refill 

## 2022-07-06 NOTE — Telephone Encounter (Signed)
[name]) request refill for Teriflunomide 14 MG TABS

## 2022-07-07 ENCOUNTER — Emergency Department
Admission: EM | Admit: 2022-07-07 | Discharge: 2022-07-07 | Disposition: A | Discharge status: Home / Self Care | Payer: BLUE CROSS/BLUE SHIELD | Attending: Emergency Medicine | Admitting: Emergency Medicine

## 2022-07-07 ENCOUNTER — Other Ambulatory Visit: Payer: Self-pay

## 2022-07-07 DIAGNOSIS — R339 Retention of urine, unspecified: Secondary | ICD-10-CM | POA: Insufficient documentation

## 2022-07-07 DIAGNOSIS — E1165 Type 2 diabetes mellitus with hyperglycemia: Secondary | ICD-10-CM | POA: Diagnosis not present

## 2022-07-07 DIAGNOSIS — R739 Hyperglycemia, unspecified: Secondary | ICD-10-CM

## 2022-07-07 DIAGNOSIS — T83098A Other mechanical complication of other indwelling urethral catheter, initial encounter: Secondary | ICD-10-CM | POA: Insufficient documentation

## 2022-07-07 DIAGNOSIS — T839XXA Unspecified complication of genitourinary prosthetic device, implant and graft, initial encounter: Secondary | ICD-10-CM

## 2022-07-07 LAB — CBC
HCT: 36.6 % — ABNORMAL LOW (ref 39.0–52.0)
Hemoglobin: 12.2 g/dL — ABNORMAL LOW (ref 13.0–17.0)
MCH: 29.6 pg (ref 26.0–34.0)
MCHC: 33.3 g/dL (ref 30.0–36.0)
MCV: 88.8 fL (ref 80.0–100.0)
Platelets: 262 10*3/uL (ref 150–400)
RBC: 4.12 MIL/uL — ABNORMAL LOW (ref 4.22–5.81)
RDW: 12.6 % (ref 11.5–15.5)
WBC: 7.4 10*3/uL (ref 4.0–10.5)
nRBC: 0 % (ref 0.0–0.2)

## 2022-07-07 LAB — BASIC METABOLIC PANEL
Anion gap: 9 (ref 5–15)
BUN: 18 mg/dL (ref 6–20)
CO2: 24 mmol/L (ref 22–32)
Calcium: 8.6 mg/dL — ABNORMAL LOW (ref 8.9–10.3)
Chloride: 99 mmol/L (ref 98–111)
Creatinine, Ser: 1.17 mg/dL (ref 0.61–1.24)
GFR, Estimated: >60 mL/min (ref >60)
Glucose, Bld: 419 mg/dL — ABNORMAL HIGH (ref 70–99)
Potassium: 3.8 mmol/L (ref 3.5–5.1)
Sodium: 132 mmol/L — ABNORMAL LOW (ref 135–145)

## 2022-07-07 LAB — URINALYSIS, ROUTINE W REFLEX MICROSCOPIC
Bilirubin Urine: NEGATIVE
Glucose, UA: >=500 mg/dL — AB
Ketones, ur: NEGATIVE mg/dL
Nitrite: NEGATIVE
Protein, ur: 30 mg/dL — AB
RBC / HPF: >50 RBC/hpf — ABNORMAL HIGH (ref 0–5)
Specific Gravity, Urine: 1.031 — ABNORMAL HIGH (ref 1.005–1.030)
WBC, UA: >50 WBC/hpf — ABNORMAL HIGH (ref 0–5)
pH: 5 (ref 5.0–8.0)

## 2022-07-07 LAB — CBG MONITORING, ED
Glucose-Capillary: 395 mg/dL — ABNORMAL HIGH (ref 70–99)
Glucose-Capillary: 317 mg/dL — ABNORMAL HIGH (ref 70–99)

## 2022-07-07 MED ORDER — SODIUM CHLORIDE 0.9 % IV BOLUS
1000.0000 mL | Freq: Once | INTRAVENOUS | Status: AC
  Administered 2022-07-07: 1000 mL via INTRAVENOUS

## 2022-07-07 MED ORDER — INSULIN ASPART 100 UNIT/ML IJ SOLN
10.0000 [IU] | Freq: Once | INTRAMUSCULAR | Status: AC
  Administered 2022-07-07: 10 [IU] via SUBCUTANEOUS
  Filled 2022-07-07: qty 1

## 2022-07-07 NOTE — Discharge Instructions (Signed)
You were seen in the emergency department because he wanted your Foley catheter removed.  It was recommended that you keep the Foley catheter in place until you follow-up with urology.  You are adamant that it be removed.  If you develop urinary retention and are unable to urinate it is important that you return immediately to the emergency department to have another Foley catheter placed.  Continue to take your antibiotics as prescribed.  Take your insulin and make sure your glucose is well regulated given your infection.

## 2022-07-07 NOTE — ED Provider Triage Note (Signed)
  Emergency Medicine Provider Triage Evaluation Note  [name] , a 51 y.o.male,  was evaluated in triage.  Pt complains of Foley catheter problem.  Patient states that he has had a Foley catheter in for the past week and a half.  He contacted his urologist, Dr. Eliberto Ivory, who stated that he would remove it in 3 days.  However, patient states that he cannot wait that long because it is painful and would like to have it removed now.  Denies any other symptoms at this time.   Review of Systems  Positive: N/A. Negative: Denies fever, chest pain, vomiting  Physical Exam   Vitals:   07/07/22 1116 07/07/22 1119  BP: 135/85   Pulse: (!) 135 (!) 106  Resp: 18   Temp: 97.7 F (36.5 C)   SpO2: 99%    Gen:   Awake, no distress   Resp:  Normal effort  MSK:   Moves extremities without difficulty  Other:    Medical Decision Making  Given the patient's initial medical screening exam, the following diagnostic evaluation has been ordered. The patient will be placed in the appropriate treatment space, once one is available, to complete the evaluation and treatment. I have discussed the plan of care with the patient and I have advised the patient that an ED physician or mid-level practitioner will reevaluate their condition after the test results have been received, as the results may give them additional insight into the type of treatment they may need.    Diagnostics: Urinalysis  Treatments: none immediately   Teodoro Spray, [place] 07/07/22 1126

## 2022-07-07 NOTE — ED Provider Notes (Signed)
[facility] Provider Note    Event Date/Time   First MD Initiated Contact with Patient 07/07/22 1354     (approximate)   History   Foley Problem   HPI  [name] is a 51 y.o. male who presents to the emergency department with desire to have his Foley catheter removed.  Patient has a history of diabetes.  Patient had a recent ED visit where he was diagnosed with acute prostatitis and started on antibiotics.  He had urinary retention at that time and had a Foley catheter placed.  States that he has been on his antibiotics for a week and a half.  He wants his Foley catheter out because it is very uncomfortable.  States that it has been uncomfortable since he had it placed.  Denies any fever or chills.  Denies any nausea or vomiting.  States that he called his urologist and has an appointment next week to have it removed but he does not want to wait that long.  States that if we do not remove his Foley catheter that he would pull it out himself.      Physical Exam   Triage Vital Signs: ED Triage Vitals  Enc Vitals Group     BP 07/07/22 1116 135/85     Pulse Rate 07/07/22 1116 (!) 135     Resp 07/07/22 1116 18     Temp 07/07/22 1116 97.7 F (36.5 C)     Temp Source 07/07/22 1116 Oral     SpO2 07/07/22 1116 99 %     Weight 07/07/22 1117 220 lb (99.8 kg)     Height 07/07/22 1117 5\' 11"  (1.803 m)     Head Circumference --      Peak Flow --      Pain Score 07/07/22 1117 10     Pain Loc --      Pain Edu? --      Excl. in GC? --     Most recent vital signs: Vitals:   07/07/22 1119 07/07/22 1541  BP:  116/75  Pulse: (!) 106 86  Resp:  18  Temp:  97.8 F (36.6 C)  SpO2:  100%    Physical Exam Constitutional:      Appearance: He is well-developed.  HENT:     Head: Atraumatic.  Eyes:     Conjunctiva/sclera: Conjunctivae normal.  Cardiovascular:     Rate and Rhythm: Regular rhythm.     Heart sounds: Normal heart sounds.  Pulmonary:      Effort: Pulmonary effort is normal. No respiratory distress.     Breath sounds: Normal breath sounds.  Abdominal:     Palpations: Abdomen is soft.     Tenderness: There is no abdominal tenderness.  Genitourinary:    Comments: Foley catheter in place Musculoskeletal:        General: Normal range of motion.     Cervical back: Normal range of motion.  Skin:    General: Skin is warm.     Findings: Rash (Erythematous rash over the left arm.  No vesicles.  No overlying erythema or warmth.  No crepitus.  No induration.) present.  Neurological:     Mental Status: He is alert and oriented to person, place, and time.          IMPRESSION / MDM / ASSESSMENT AND PLAN / ED COURSE  I reviewed the triage vital signs and the nursing notes.  51 year old male with past medical history significant  for recent prostatitis complicated by urinary retention with Foley catheter in place who presents to the emergency department to have his Foley catheter removed.  Had a long discussion with the patient about removing his Foley catheter and the concern for recurrent urinary retention and that he should follow-up with his urologist at his scheduled appointment next week to have his Foley catheter removed.  Patient was adamant that he wanted his Foley catheter removed because it was uncomfortable.  States that he would pull it out if not.  He understood that if he was unable to urinate after the Foley catheter was removed he would need to be reinserted.  Differential diagnosis including urinary retention, ongoing urinary tract infection, DKA, hyperglycemia.  Fingerstick blood glucose elevated.  On chart review patient had hyperglycemia during his last ED visit.  Patient states that he has been noncompliant with his insulin.  On chart review of his last urine culture was sensitive to tetracycline and he has been on doxycycline.  Patient with hyperglycemia but no signs of DKA on lab work.  Patient received IV  fluids.  Also received insulin.  Repeat glucose had improved to the 300s.  Foley catheter was removed.  On reevaluation patient states that he was able to urinate on his own without any difficulties.  Recommended follow-up with urology.  Given return precautions for any worsening symptoms.  Discussed if he got home and had any urinary retention he would need to return to the emergency department to have Foley catheter replaced.  He expressed understanding.  Recommended improving his glucose control and to continue his antibiotics.      ED Results / Procedures / Treatments   Labs (all labs ordered are listed, but only abnormal results are displayed) Labs interpreted as -   UA with blood, low suspicion for urinary tract infection.  Urine culture obtained. Labs Reviewed  URINALYSIS, ROUTINE W REFLEX MICROSCOPIC - Abnormal; Notable for the following components:      Result Value   Color, Urine YELLOW (*)    APPearance HAZY (*)    Specific Gravity, Urine 1.031 (*)    Glucose, UA >=500 (*)    Hgb urine dipstick MODERATE (*)    Protein, ur 30 (*)    Leukocytes,Ua MODERATE (*)    RBC / HPF >50 (*)    WBC, UA >50 (*)    Bacteria, UA RARE (*)    All other components within normal limits  BASIC METABOLIC PANEL - Abnormal; Notable for the following components:   Sodium 132 (*)    Glucose, Bld 419 (*)    Calcium 8.6 (*)    All other components within normal limits  CBC - Abnormal; Notable for the following components:   RBC 4.12 (*)    Hemoglobin 12.2 (*)    HCT 36.6 (*)    All other components within normal limits  CBG MONITORING, ED - Abnormal; Notable for the following components:   Glucose-Capillary 395 (*)    All other components within normal limits  CBG MONITORING, ED - Abnormal; Notable for the following components:   Glucose-Capillary 317 (*)    All other components within normal limits  URINE CULTURE  CBG MONITORING, ED     EKG     RADIOLOGY My interpretation of  imaging:     PROCEDURES:  Critical Care performed: No  Procedures  Patient's presentation is most consistent with acute presentation with potential threat to life or bodily function.   MEDICATIONS ORDERED IN ED:  Medications  sodium chloride 0.9 % bolus 1,000 mL (0 mLs Intravenous Stopped 07/07/22 1800)  insulin aspart (novoLOG) injection 10 Units (10 Units Subcutaneous Given 07/07/22 1637)    FINAL CLINICAL IMPRESSION(S) / ED DIAGNOSES   Final diagnoses:  Hyperglycemia  Complication of Foley catheter, initial encounter ([place])     Rx / DC Orders   ED Discharge Orders     None        Note:  This document was prepared using Dragon voice recognition software and may include unintentional dictation errors.   Nathaniel Man, MD 07/07/22 [phone]

## 2022-07-07 NOTE — ED Triage Notes (Addendum)
Pt states he is having pain from his foley- pt states he has had the foley in x1 week- pt states that he wants it removed- pt states it was put in for a UTI and swollen prostate- pt states it is draining well

## 2022-07-09 ENCOUNTER — Other Ambulatory Visit: Payer: Self-pay

## 2022-07-09 ENCOUNTER — Emergency Department
Admission: EM | Admit: 2022-07-09 | Discharge: 2022-07-09 | Disposition: A | Discharge status: Home / Self Care | Payer: BLUE CROSS/BLUE SHIELD | Attending: Emergency Medicine | Admitting: Emergency Medicine

## 2022-07-09 DIAGNOSIS — Z794 Long term (current) use of insulin: Secondary | ICD-10-CM | POA: Diagnosis not present

## 2022-07-09 DIAGNOSIS — I10 Essential (primary) hypertension: Secondary | ICD-10-CM | POA: Insufficient documentation

## 2022-07-09 DIAGNOSIS — N390 Urinary tract infection, site not specified: Secondary | ICD-10-CM | POA: Insufficient documentation

## 2022-07-09 DIAGNOSIS — R339 Retention of urine, unspecified: Secondary | ICD-10-CM | POA: Insufficient documentation

## 2022-07-09 DIAGNOSIS — E1165 Type 2 diabetes mellitus with hyperglycemia: Secondary | ICD-10-CM | POA: Diagnosis not present

## 2022-07-09 LAB — URINALYSIS, ROUTINE W REFLEX MICROSCOPIC
RBC / HPF: >50 RBC/hpf — ABNORMAL HIGH (ref 0–5)
Specific Gravity, Urine: 1.027 (ref 1.005–1.030)
WBC, UA: >50 WBC/hpf — ABNORMAL HIGH (ref 0–5)

## 2022-07-09 LAB — URINE CULTURE

## 2022-07-09 MED ORDER — NITROFURANTOIN MONOHYD MACRO 100 MG PO CAPS: 100.0000 mg | ORAL_CAPSULE | Freq: Two times a day (BID) | ORAL | 0 refills | Status: AC

## 2022-07-09 MED ORDER — NITROFURANTOIN MONOHYD MACRO 100 MG PO CAPS
100.0000 mg | ORAL_CAPSULE | Freq: Once | ORAL | Status: AC
  Administered 2022-07-09: 100 mg via ORAL
  Filled 2022-07-09: qty 1

## 2022-07-09 NOTE — ED Notes (Signed)
Bladder scan performed 766mL in bladder.

## 2022-07-09 NOTE — Discharge Instructions (Addendum)
I recommend close follow-up with your urologist for further recommendations.  I recommending stopping Keflex as this is not treating your current UTI and start taking Macrobid.  Please keep your Foley catheter in place.  Please follow-up with urology.

## 2022-07-09 NOTE — ED Provider Notes (Signed)
[facility] Provider Note    Event Date/Time   First MD Initiated Contact with Patient 07/09/22 [phone]     (approximate)   History   Urinary Retention   HPI  [name] is a 51 y.o. male history of hypertension, diabetes, MS, anxiety, recurrent urinary retention who presents to the emergency department urinary retention that started tonight.  Patient states that he is "on 5 antibiotics" and that "they need to get this figured out".  He states he is seeing Dr. Sheppard Penton with urology but does not know when his next appointment is.  He does not know what antibiotic he is taking.  No fevers or vomiting.  Foley catheter placed in the waiting room and he is draining appropriately now.   History provided by patient.    Past Medical History:  Diagnosis Date   Anxiety    Diabetes mellitus without complication (HCC)    Hypertension    MS (multiple sclerosis) (HCC)     Past Surgical History:  Procedure Laterality Date   AMPUTATION Left 10/02/2017   Procedure: AMPUTATION RAY-LEFT 5TH;  Surgeon: Linus Galas, DPM;  Location: [facility] ORS;  Service: Podiatry;  Laterality: Left;   CATARACT EXTRACTION, BILATERAL     TOE AMPUTATION      MEDICATIONS:  Prior to Admission medications   Medication Sig Start Date End Date Taking? Authorizing Provider  baclofen (LIORESAL) 10 MG tablet 1/2 to 1 pill po tid 06/18/22   Sater, Pearletha Furl, MD  busPIRone (BUSPAR) 5 MG tablet TAKE 1 TABLET(5 MG) BY MOUTH EVERY DAY 04/10/21   [provider]  Continuous Blood Gluc Sensor (FREESTYLE LIBRE SENSOR SYSTEM) MISC U UTD Q 10 DAYS. 06/07/17   [provider]  hydrochlorothiazide (HYDRODIURIL) 12.5 MG tablet Take 12.5 mg by mouth daily. 03/30/21   [provider]  insulin aspart (NOVOLOG FLEXPEN) 100 UNIT/ML FlexPen INJECT 55 UNITS UNDER THE SKIN THREE TIMES DAILY WITH MEALS 03/09/21   [provider]  insulin detemir (LEVEMIR) 100 UNIT/ML FlexPen Inject 40 Units  into the skin daily. 06/30/18   [provider]  lisinopril (ZESTRIL) 10 MG tablet Take 10 mg by mouth daily. 03/30/21   [provider]  Multiple Vitamin (MULTIVITAMIN WITH MINERALS) TABS tablet Take 1 tablet by mouth daily. 10/05/17   Katha Hamming, MD  PARoxetine (PAXIL) 20 MG tablet Take 40 mg by mouth daily.  03/06/17   [provider]  Teriflunomide 14 MG TABS TAKE 1 TABLET BY MOUTH DAILY. 07/06/22   Sater, Pearletha Furl, MD  Vitamin D, Ergocalciferol, (DRISDOL) 1.25 MG (50000 UNIT) CAPS capsule Take 1 capsule by mouth once a week for 26 weeks 07/28/20   Asa Lente, MD    Physical Exam   Triage Vital Signs: ED Triage Vitals  Enc Vitals Group     BP 07/09/22 0305 (!) 128/91     Pulse Rate 07/09/22 0304 99     Resp 07/09/22 0304 16     Temp 07/09/22 0304 97.8 F (36.6 C)     Temp Source 07/09/22 0304 Oral     SpO2 07/09/22 0304 100 %     Weight 07/09/22 0302 218 lb 4.1 oz (99 kg)     Height 07/09/22 0302 5\' 11"  (1.803 m)     Head Circumference --      Peak Flow --      Pain Score 07/09/22 0301 6     Pain Loc --  Pain Edu? --      Excl. in GC? --     Most recent vital signs: Vitals:   07/09/22 0305 07/09/22 0648  BP: (!) 128/91 131/64  Pulse:  78  Resp:  19  Temp:  98.2 F (36.8 C)  SpO2:  99%    CONSTITUTIONAL: Alert and oriented and responds appropriately to questions. Well-appearing; well-nourished HEAD: Normocephalic, atraumatic EYES: Conjunctivae clear, pupils appear equal, sclera nonicteric ENT: normal nose; moist mucous membranes NECK: Supple, normal ROM CARD: RRR; S1 and S2 appreciated; no murmurs, no clicks, no rubs, no gallops RESP: Normal chest excursion without splinting or tachypnea; breath sounds clear and equal bilaterally; no wheezes, no rhonchi, no rales, no hypoxia or respiratory distress, speaking full sentences ABD/GI: Normal bowel sounds; non-distended; soft, non-tender, no rebound, no guarding, no peritoneal  signs BACK: The back appears normal EXT: Normal ROM in all joints; no deformity noted, no edema; no cyanosis SKIN: Normal color for age and race; warm; no rash on exposed skin NEURO: Moves all extremities equally, normal speech PSYCH: The patient's mood and manner are appropriate.   ED Results / Procedures / Treatments   LABS: (all labs ordered are listed, but only abnormal results are displayed) Labs Reviewed  URINALYSIS, ROUTINE W REFLEX MICROSCOPIC - Abnormal; Notable for the following components:      Result Value   Color, Urine BLUE (*)    APPearance CLOUDY (*)    Glucose, UA   (*)    Value: TEST NOT REPORTED DUE TO COLOR INTERFERENCE OF URINE PIGMENT   Hgb urine dipstick   (*)    Value: TEST NOT REPORTED DUE TO COLOR INTERFERENCE OF URINE PIGMENT   Bilirubin Urine   (*)    Value: TEST NOT REPORTED DUE TO COLOR INTERFERENCE OF URINE PIGMENT   Ketones, ur   (*)    Value: TEST NOT REPORTED DUE TO COLOR INTERFERENCE OF URINE PIGMENT   Protein, ur   (*)    Value: TEST NOT REPORTED DUE TO COLOR INTERFERENCE OF URINE PIGMENT   Nitrite   (*)    Value: TEST NOT REPORTED DUE TO COLOR INTERFERENCE OF URINE PIGMENT   Leukocytes,Ua   (*)    Value: TEST NOT REPORTED DUE TO COLOR INTERFERENCE OF URINE PIGMENT   RBC / HPF >50 (*)    WBC, UA >50 (*)    Bacteria, UA MANY (*)    All other components within normal limits  URINE CULTURE     EKG:   RADIOLOGY: My personal review and interpretation of imaging:    I have personally reviewed all radiology reports.   No results found.   PROCEDURES:  Critical Care performed: No     Procedures    IMPRESSION / MDM / ASSESSMENT AND PLAN / ED COURSE  I reviewed the triage vital signs and the nursing notes.    Patient here with complaints of urinary retention.  Has been on antibiotics per his report recently for UTI that he does not feel is helping.  The patient is on the cardiac monitor to evaluate for evidence of arrhythmia  and/or significant heart rate changes.   DIFFERENTIAL DIAGNOSIS (includes but not limited to):   Urinary retention, BPH, UTI   Patient's presentation is most consistent with acute presentation with potential threat to life or bodily function.   PLAN: Patient here with urinary retention.  Foley catheter placed in triage.  Patient draining appropriately but urine does appear grossly infected.  Culture is  pending.  It looks like his last culture grew MRSA that was sensitive to Macrobid.  He has no fevers, vomiting or flank pain.  This is his fourth visit to the emergency department for the same.  Was seen on 10/5, 10/7 and 10/14.  On 10/5 patient was given Keflex.  On 10/7 he was diagnosed with prostatitis and was given doxycycline on discharge and was given a gram of Rocephin in the ED.   Labs at 1546 on 07/07/2022 showed no leukocytosis.  Creatinine was 1.17.  Hyperglycemic which appears chronic for patient but no DKA.  I do not feel he needs repeat blood work today given this was less than 48 hours ago.  MEDICATIONS GIVEN IN ED: Medications  nitrofurantoin (macrocrystal-monohydrate) (MACROBID) capsule 100 mg (100 mg Oral Given 07/09/22 0647)     ED COURSE: Have advised patient to continue his doxycycline but stopped his Keflex if he is still on this medication although that was prescribed 11 days ago.  It is unclear how much doxycycline he has left and he tells me he thinks he is on 5 antibiotics.  I have recommended that he clarify this with the medications he has at home but will provide him with a prescription for Macrobid which should cover his MRSA if he is already completed his course of doxycycline.  A repeat culture is pending today.  I have given him urology follow-up for further evaluation.  He is afebrile, nontoxic and well-appearing here with benign abdominal exam.  I feel he is safe for discharge with outpatient management.   At this time, I do not feel there is any  life-threatening condition present. I reviewed all nursing notes, vitals, pertinent previous records.  All lab and urine results, EKGs, imaging ordered have been independently reviewed and interpreted by myself.  I reviewed all available radiology reports from any imaging ordered this visit.  Based on my assessment, I feel the patient is safe to be discharged home without further emergent workup and can continue workup as an outpatient as needed. Discussed all findings, treatment plan as well as usual and customary return precautions.  They verbalize understanding and are comfortable with this plan.  Outpatient follow-up has been provided as needed.  All questions have been answered.    CONSULTS: No admission required at this time.  This is patient's fourth visit for urinary retention and uncomplicated UTI.  Given outpatient follow-up.  No signs of sepsis currently.   OUTSIDE RECORDS REVIEWED: Reviewed patient's last admission in January 2019.       FINAL CLINICAL IMPRESSION(S) / ED DIAGNOSES   Final diagnoses:  Urinary retention  Acute UTI     Rx / DC Orders   ED Discharge Orders          Ordered    nitrofurantoin, macrocrystal-monohydrate, (MACROBID) 100 MG capsule  2 times daily        07/09/22 1448             Note:  This document was prepared using Dragon voice recognition software and may include unintentional dictation errors.   Hasel Janish, Delice Bison, DO 07/09/22 [phone]

## 2022-07-09 NOTE — Telephone Encounter (Signed)
His insurance would not approved any [place] locations for him to get the MRIs done. The closest facility they would let me put on the authorization was  [facility]  [address] [place] [address] [phone] I faxed the signed MRI orders today. Dillon Bjork: [phone] exp. 07/05/22-08/03/22

## 2022-07-09 NOTE — ED Triage Notes (Signed)
Patient reports he had foley removed on Saturday and that he now can't urinate states thinks last time was Sunday night.

## 2022-07-09 NOTE — ED Notes (Signed)
Pt Dc to home. Dc instructions reviewed with all questions answered. Pt verbalizes understanding. Pt out of dept via wheelchair.

## 2022-07-11 LAB — URINE CULTURE: Culture: NO GROWTH

## 2022-10-22 ENCOUNTER — Emergency Department
Admission: EM | Admit: 2022-10-22 | Discharge: 2022-10-23 | Disposition: A | Discharge status: Other Acute Inpatient Hospital | Payer: BLUE CROSS/BLUE SHIELD | Attending: Emergency Medicine | Admitting: Emergency Medicine

## 2022-10-22 DIAGNOSIS — E1111 Type 2 diabetes mellitus with ketoacidosis with coma: Secondary | ICD-10-CM | POA: Diagnosis not present

## 2022-10-22 DIAGNOSIS — G40901 Epilepsy, unspecified, not intractable, with status epilepticus: Secondary | ICD-10-CM | POA: Diagnosis not present

## 2022-10-22 DIAGNOSIS — E11621 Type 2 diabetes mellitus with foot ulcer: Secondary | ICD-10-CM | POA: Diagnosis not present

## 2022-10-22 DIAGNOSIS — A419 Sepsis, unspecified organism: Secondary | ICD-10-CM | POA: Insufficient documentation

## 2022-10-22 DIAGNOSIS — I10 Essential (primary) hypertension: Secondary | ICD-10-CM | POA: Diagnosis not present

## 2022-10-22 DIAGNOSIS — E08621 Diabetes mellitus due to underlying condition with foot ulcer: Secondary | ICD-10-CM

## 2022-10-22 DIAGNOSIS — Z79899 Other long term (current) drug therapy: Secondary | ICD-10-CM | POA: Diagnosis not present

## 2022-10-22 DIAGNOSIS — R509 Fever, unspecified: Secondary | ICD-10-CM

## 2022-10-22 DIAGNOSIS — L97521 Non-pressure chronic ulcer of other part of left foot limited to breakdown of skin: Secondary | ICD-10-CM | POA: Insufficient documentation

## 2022-10-22 DIAGNOSIS — Z794 Long term (current) use of insulin: Secondary | ICD-10-CM | POA: Insufficient documentation

## 2022-10-22 DIAGNOSIS — R569 Unspecified convulsions: Secondary | ICD-10-CM | POA: Diagnosis present

## 2022-10-22 DIAGNOSIS — N179 Acute kidney failure, unspecified: Secondary | ICD-10-CM | POA: Insufficient documentation

## 2022-10-22 DIAGNOSIS — Z1152 Encounter for screening for COVID-19: Secondary | ICD-10-CM | POA: Diagnosis not present

## 2022-10-23 ENCOUNTER — Inpatient Hospital Stay (HOSPITAL_COMMUNITY): Payer: BLUE CROSS/BLUE SHIELD

## 2022-10-23 ENCOUNTER — Inpatient Hospital Stay (HOSPITAL_COMMUNITY)
Admission: RE | Admit: 2022-10-23 | Discharge: 2022-10-31 | DRG: 100 | Disposition: A | Discharge status: Home Health | Payer: BLUE CROSS/BLUE SHIELD | Source: Ambulatory Visit | Attending: Internal Medicine | Admitting: Internal Medicine

## 2022-10-23 ENCOUNTER — Encounter: Payer: Self-pay | Admitting: Emergency Medicine

## 2022-10-23 ENCOUNTER — Encounter (HOSPITAL_COMMUNITY): Payer: Self-pay

## 2022-10-23 ENCOUNTER — Emergency Department: Payer: BLUE CROSS/BLUE SHIELD

## 2022-10-23 DIAGNOSIS — L97529 Non-pressure chronic ulcer of other part of left foot with unspecified severity: Secondary | ICD-10-CM | POA: Diagnosis present

## 2022-10-23 DIAGNOSIS — Z89432 Acquired absence of left foot: Secondary | ICD-10-CM

## 2022-10-23 DIAGNOSIS — J9601 Acute respiratory failure with hypoxia: Secondary | ICD-10-CM | POA: Diagnosis present

## 2022-10-23 DIAGNOSIS — Z794 Long term (current) use of insulin: Secondary | ICD-10-CM

## 2022-10-23 DIAGNOSIS — Z91013 Allergy to seafood: Secondary | ICD-10-CM | POA: Diagnosis not present

## 2022-10-23 DIAGNOSIS — Z79899 Other long term (current) drug therapy: Secondary | ICD-10-CM

## 2022-10-23 DIAGNOSIS — G40901 Epilepsy, unspecified, not intractable, with status epilepticus: Principal | ICD-10-CM

## 2022-10-23 DIAGNOSIS — E1111 Type 2 diabetes mellitus with ketoacidosis with coma: Secondary | ICD-10-CM

## 2022-10-23 DIAGNOSIS — E872 Acidosis, unspecified: Secondary | ICD-10-CM | POA: Diagnosis present

## 2022-10-23 DIAGNOSIS — R569 Unspecified convulsions: Secondary | ICD-10-CM | POA: Diagnosis not present

## 2022-10-23 DIAGNOSIS — Z1152 Encounter for screening for COVID-19: Secondary | ICD-10-CM | POA: Diagnosis not present

## 2022-10-23 DIAGNOSIS — I10 Essential (primary) hypertension: Secondary | ICD-10-CM | POA: Diagnosis present

## 2022-10-23 DIAGNOSIS — N179 Acute kidney failure, unspecified: Secondary | ICD-10-CM | POA: Diagnosis present

## 2022-10-23 DIAGNOSIS — L89151 Pressure ulcer of sacral region, stage 1: Secondary | ICD-10-CM | POA: Diagnosis present

## 2022-10-23 DIAGNOSIS — J690 Pneumonitis due to inhalation of food and vomit: Secondary | ICD-10-CM | POA: Diagnosis present

## 2022-10-23 DIAGNOSIS — E1165 Type 2 diabetes mellitus with hyperglycemia: Secondary | ICD-10-CM | POA: Diagnosis present

## 2022-10-23 DIAGNOSIS — Z7982 Long term (current) use of aspirin: Secondary | ICD-10-CM

## 2022-10-23 DIAGNOSIS — I69398 Other sequelae of cerebral infarction: Secondary | ICD-10-CM

## 2022-10-23 DIAGNOSIS — M4802 Spinal stenosis, cervical region: Secondary | ICD-10-CM | POA: Diagnosis present

## 2022-10-23 DIAGNOSIS — Z23 Encounter for immunization: Secondary | ICD-10-CM

## 2022-10-23 DIAGNOSIS — E876 Hypokalemia: Secondary | ICD-10-CM | POA: Diagnosis present

## 2022-10-23 DIAGNOSIS — L8990 Pressure ulcer of unspecified site, unspecified stage: Secondary | ICD-10-CM | POA: Insufficient documentation

## 2022-10-23 DIAGNOSIS — E11319 Type 2 diabetes mellitus with unspecified diabetic retinopathy without macular edema: Secondary | ICD-10-CM | POA: Diagnosis present

## 2022-10-23 DIAGNOSIS — E11621 Type 2 diabetes mellitus with foot ulcer: Secondary | ICD-10-CM | POA: Diagnosis present

## 2022-10-23 DIAGNOSIS — E1151 Type 2 diabetes mellitus with diabetic peripheral angiopathy without gangrene: Secondary | ICD-10-CM | POA: Diagnosis present

## 2022-10-23 DIAGNOSIS — E669 Obesity, unspecified: Secondary | ICD-10-CM | POA: Diagnosis present

## 2022-10-23 DIAGNOSIS — G9340 Encephalopathy, unspecified: Secondary | ICD-10-CM

## 2022-10-23 DIAGNOSIS — E1169 Type 2 diabetes mellitus with other specified complication: Principal | ICD-10-CM

## 2022-10-23 DIAGNOSIS — J189 Pneumonia, unspecified organism: Secondary | ICD-10-CM | POA: Diagnosis not present

## 2022-10-23 DIAGNOSIS — G35 Multiple sclerosis: Secondary | ICD-10-CM | POA: Diagnosis present

## 2022-10-23 DIAGNOSIS — G40909 Epilepsy, unspecified, not intractable, without status epilepticus: Secondary | ICD-10-CM | POA: Diagnosis not present

## 2022-10-23 DIAGNOSIS — Z6835 Body mass index (BMI) 35.0-35.9, adult: Secondary | ICD-10-CM

## 2022-10-23 LAB — POCT I-STAT 7, (LYTES, BLD GAS, ICA,H+H)
Acid-base deficit: 4 mmol/L — ABNORMAL HIGH (ref 0.0–2.0)
Bicarbonate: 20.5 mmol/L (ref 20.0–28.0)
Calcium, Ion: 1.15 mmol/L (ref 1.15–1.40)
HCT: 40 % (ref 39.0–52.0)
Hemoglobin: 13.6 g/dL (ref 13.0–17.0)
O2 Saturation: 100 %
Patient temperature: 98
Potassium: 3.6 mmol/L (ref 3.5–5.1)
Sodium: 136 mmol/L (ref 135–145)
TCO2: 22 mmol/L (ref 22–32)
pCO2 arterial: 34.5 mmHg (ref 32–48)
pH, Arterial: 7.382 (ref 7.35–7.45)
pO2, Arterial: 187 mmHg — ABNORMAL HIGH (ref 83–108)

## 2022-10-23 LAB — COMPREHENSIVE METABOLIC PANEL
ALT: 15 U/L (ref 0–44)
AST: 36 U/L (ref 15–41)
Albumin: 3.8 g/dL (ref 3.5–5.0)
Alkaline Phosphatase: 121 U/L (ref 38–126)
Anion gap: 18 — ABNORMAL HIGH (ref 5–15)
BUN: 16 mg/dL (ref 6–20)
CO2: 19 mmol/L — ABNORMAL LOW (ref 22–32)
Calcium: 9.2 mg/dL (ref 8.9–10.3)
Chloride: 92 mmol/L — ABNORMAL LOW (ref 98–111)
Creatinine, Ser: 1.52 mg/dL — ABNORMAL HIGH (ref 0.61–1.24)
GFR, Estimated: 55 mL/min — ABNORMAL LOW (ref >60)
Glucose, Bld: 580 mg/dL (ref 70–99)
Potassium: 3.9 mmol/L (ref 3.5–5.1)
Sodium: 129 mmol/L — ABNORMAL LOW (ref 135–145)
Total Bilirubin: 0.7 mg/dL (ref 0.3–1.2)
Total Protein: 8.5 g/dL — ABNORMAL HIGH (ref 6.5–8.1)

## 2022-10-23 LAB — CSF CELL COUNT WITH DIFFERENTIAL
Eosinophils, CSF: 0 % (ref 0–1)
Eosinophils, CSF: 0 % (ref 0–1)
Lymphs, CSF: 19 % — ABNORMAL LOW (ref 40–80)
Lymphs, CSF: 96 % — ABNORMAL HIGH (ref 40–80)
Monocyte-Macrophage-Spinal Fluid: 0 % — ABNORMAL LOW (ref 15–45)
Monocyte-Macrophage-Spinal Fluid: 1 % — ABNORMAL LOW (ref 15–45)
RBC Count, CSF: 12650 /mm3 — ABNORMAL HIGH
RBC Count, CSF: 395 /mm3 — ABNORMAL HIGH
Segmented Neutrophils-CSF: 3 % (ref 0–6)
Segmented Neutrophils-CSF: 81 % — ABNORMAL HIGH (ref 0–6)
Tube #: 1
Tube #: 4
WBC, CSF: 10 /mm3 — ABNORMAL HIGH (ref 0–5)
WBC, CSF: 10 /mm3 — ABNORMAL HIGH (ref 0–5)

## 2022-10-23 LAB — BASIC METABOLIC PANEL
Anion gap: 8 (ref 5–15)
Anion gap: 9 (ref 5–15)
Anion gap: 11 (ref 5–15)
Anion gap: 12 (ref 5–15)
Anion gap: 10 (ref 5–15)
BUN: 17 mg/dL (ref 6–20)
BUN: 15 mg/dL (ref 6–20)
BUN: 15 mg/dL (ref 6–20)
BUN: 14 mg/dL (ref 6–20)
BUN: 12 mg/dL (ref 6–20)
CO2: 24 mmol/L (ref 22–32)
CO2: 21 mmol/L — ABNORMAL LOW (ref 22–32)
CO2: 23 mmol/L (ref 22–32)
CO2: 21 mmol/L — ABNORMAL LOW (ref 22–32)
CO2: 21 mmol/L — ABNORMAL LOW (ref 22–32)
Calcium: 8.6 mg/dL — ABNORMAL LOW (ref 8.9–10.3)
Calcium: 8.5 mg/dL — ABNORMAL LOW (ref 8.9–10.3)
Calcium: 9.3 mg/dL (ref 8.9–10.3)
Calcium: 9.1 mg/dL (ref 8.9–10.3)
Calcium: 9.1 mg/dL (ref 8.9–10.3)
Chloride: 99 mmol/L (ref 98–111)
Chloride: 98 mmol/L (ref 98–111)
Chloride: 100 mmol/L (ref 98–111)
Chloride: 103 mmol/L (ref 98–111)
Chloride: 105 mmol/L (ref 98–111)
Creatinine, Ser: 1.15 mg/dL (ref 0.61–1.24)
Creatinine, Ser: 1.18 mg/dL (ref 0.61–1.24)
Creatinine, Ser: 1.04 mg/dL (ref 0.61–1.24)
Creatinine, Ser: 0.98 mg/dL (ref 0.61–1.24)
Creatinine, Ser: 1.07 mg/dL (ref 0.61–1.24)
GFR, Estimated: >60 mL/min (ref >60)
GFR, Estimated: >60 mL/min (ref >60)
GFR, Estimated: >60 mL/min (ref >60)
GFR, Estimated: >60 mL/min (ref >60)
GFR, Estimated: >60 mL/min (ref >60)
Glucose, Bld: 423 mg/dL — ABNORMAL HIGH (ref 70–99)
Glucose, Bld: 397 mg/dL — ABNORMAL HIGH (ref 70–99)
Glucose, Bld: 199 mg/dL — ABNORMAL HIGH (ref 70–99)
Glucose, Bld: 209 mg/dL — ABNORMAL HIGH (ref 70–99)
Glucose, Bld: 199 mg/dL — ABNORMAL HIGH (ref 70–99)
Potassium: 3.9 mmol/L (ref 3.5–5.1)
Potassium: 5.7 mmol/L — ABNORMAL HIGH (ref 3.5–5.1)
Potassium: 3.4 mmol/L — ABNORMAL LOW (ref 3.5–5.1)
Potassium: 3.7 mmol/L (ref 3.5–5.1)
Potassium: 3.5 mmol/L (ref 3.5–5.1)
Sodium: 131 mmol/L — ABNORMAL LOW (ref 135–145)
Sodium: 128 mmol/L — ABNORMAL LOW (ref 135–145)
Sodium: 134 mmol/L — ABNORMAL LOW (ref 135–145)
Sodium: 136 mmol/L (ref 135–145)
Sodium: 136 mmol/L (ref 135–145)

## 2022-10-23 LAB — CBC WITH DIFFERENTIAL/PLATELET
Abs Immature Granulocytes: 0.06 10*3/uL (ref 0.00–0.07)
Abs Immature Granulocytes: 0.14 10*3/uL — ABNORMAL HIGH (ref 0.00–0.07)
Basophils Absolute: 0 10*3/uL (ref 0.0–0.1)
Basophils Absolute: 0.1 10*3/uL (ref 0.0–0.1)
Basophils Relative: 0 %
Basophils Relative: 1 %
Eosinophils Absolute: 0 10*3/uL (ref 0.0–0.5)
Eosinophils Absolute: 0.2 10*3/uL (ref 0.0–0.5)
Eosinophils Relative: 0 %
Eosinophils Relative: 2 %
HCT: 41.1 % (ref 39.0–52.0)
HCT: 50.4 % (ref 39.0–52.0)
Hemoglobin: 14.3 g/dL (ref 13.0–17.0)
Hemoglobin: 16.7 g/dL (ref 13.0–17.0)
Immature Granulocytes: 1 %
Immature Granulocytes: 2 %
Lymphocytes Relative: 37 %
Lymphocytes Relative: 5 %
Lymphs Abs: 0.5 10*3/uL — ABNORMAL LOW (ref 0.7–4.0)
Lymphs Abs: 3 10*3/uL (ref 0.7–4.0)
MCH: 29.6 pg (ref 26.0–34.0)
MCH: 30.2 pg (ref 26.0–34.0)
MCHC: 33.1 g/dL (ref 30.0–36.0)
MCHC: 34.8 g/dL (ref 30.0–36.0)
MCV: 86.7 fL (ref 80.0–100.0)
MCV: 89.4 fL (ref 80.0–100.0)
Monocytes Absolute: 0.3 10*3/uL (ref 0.1–1.0)
Monocytes Absolute: 0.5 10*3/uL (ref 0.1–1.0)
Monocytes Relative: 3 %
Monocytes Relative: 6 %
Neutro Abs: 4.3 10*3/uL (ref 1.7–7.7)
Neutro Abs: 9.1 10*3/uL — ABNORMAL HIGH (ref 1.7–7.7)
Neutrophils Relative %: 52 %
Neutrophils Relative %: 91 %
Platelets: 178 10*3/uL (ref 150–400)
Platelets: 323 10*3/uL (ref 150–400)
RBC: 4.74 MIL/uL (ref 4.22–5.81)
RBC: 5.64 MIL/uL (ref 4.22–5.81)
RDW: 12.4 % (ref 11.5–15.5)
RDW: 12.5 % (ref 11.5–15.5)
WBC: 8.1 10*3/uL (ref 4.0–10.5)
WBC: 9.9 10*3/uL (ref 4.0–10.5)
nRBC: 0 % (ref 0.0–0.2)
nRBC: 0 % (ref 0.0–0.2)

## 2022-10-23 LAB — BLOOD GAS, ARTERIAL
Acid-base deficit: 5.8 mmol/L — ABNORMAL HIGH (ref 0.0–2.0)
Bicarbonate: 20.1 mmol/L (ref 20.0–28.0)
FIO2: 60 %
MECHVT: 500 mL
Mechanical Rate: 20
O2 Saturation: 97.9 %
PEEP: 5 cmH2O
Patient temperature: 37
pCO2 arterial: 40 mmHg (ref 32–48)
pH, Arterial: 7.31 — ABNORMAL LOW (ref 7.35–7.45)
pO2, Arterial: 86 mmHg (ref 83–108)

## 2022-10-23 LAB — LACTIC ACID, PLASMA
Lactic Acid, Venous: 3 mmol/L (ref 0.5–1.9)
Lactic Acid, Venous: >9 mmol/L (ref 0.5–1.9)
Lactic Acid, Venous: >9 mmol/L (ref 0.5–1.9)

## 2022-10-23 LAB — GLUCOSE, CAPILLARY
Glucose-Capillary: 252 mg/dL — ABNORMAL HIGH (ref 70–99)
Glucose-Capillary: 192 mg/dL — ABNORMAL HIGH (ref 70–99)
Glucose-Capillary: 217 mg/dL — ABNORMAL HIGH (ref 70–99)
Glucose-Capillary: 279 mg/dL — ABNORMAL HIGH (ref 70–99)
Glucose-Capillary: 225 mg/dL — ABNORMAL HIGH (ref 70–99)
Glucose-Capillary: 256 mg/dL — ABNORMAL HIGH (ref 70–99)
Glucose-Capillary: 265 mg/dL — ABNORMAL HIGH (ref 70–99)
Glucose-Capillary: 294 mg/dL — ABNORMAL HIGH (ref 70–99)
Glucose-Capillary: 249 mg/dL — ABNORMAL HIGH (ref 70–99)
Glucose-Capillary: 198 mg/dL — ABNORMAL HIGH (ref 70–99)
Glucose-Capillary: 211 mg/dL — ABNORMAL HIGH (ref 70–99)
Glucose-Capillary: 216 mg/dL — ABNORMAL HIGH (ref 70–99)
Glucose-Capillary: 189 mg/dL — ABNORMAL HIGH (ref 70–99)
Glucose-Capillary: 197 mg/dL — ABNORMAL HIGH (ref 70–99)
Glucose-Capillary: 261 mg/dL — ABNORMAL HIGH (ref 70–99)
Glucose-Capillary: 185 mg/dL — ABNORMAL HIGH (ref 70–99)

## 2022-10-23 LAB — MENINGITIS/ENCEPHALITIS PANEL (CSF)

## 2022-10-23 LAB — URINALYSIS, ROUTINE W REFLEX MICROSCOPIC
Bacteria, UA: NONE SEEN
Bilirubin Urine: NEGATIVE
Glucose, UA: >=500 mg/dL — AB
Ketones, ur: NEGATIVE mg/dL
Leukocytes,Ua: NEGATIVE
Nitrite: NEGATIVE
Protein, ur: 100 mg/dL — AB
Specific Gravity, Urine: 1.028 (ref 1.005–1.030)
pH: 5 (ref 5.0–8.0)

## 2022-10-23 LAB — URINE DRUG SCREEN, QUALITATIVE (ARMC ONLY)
Amphetamines, Ur Screen: NOT DETECTED
Barbiturates, Ur Screen: NOT DETECTED
Benzodiazepine, Ur Scrn: NOT DETECTED
Cannabinoid 50 Ng, Ur ~~LOC~~: NOT DETECTED
Cocaine Metabolite,Ur ~~LOC~~: NOT DETECTED
MDMA (Ecstasy)Ur Screen: NOT DETECTED
Methadone Scn, Ur: NOT DETECTED
Opiate, Ur Screen: NOT DETECTED
Phencyclidine (PCP) Ur S: NOT DETECTED
Tricyclic, Ur Screen: NOT DETECTED

## 2022-10-23 LAB — CBC
HCT: 41 % (ref 39.0–52.0)
Hemoglobin: 14.3 g/dL (ref 13.0–17.0)
MCH: 30.6 pg (ref 26.0–34.0)
MCHC: 34.9 g/dL (ref 30.0–36.0)
MCV: 87.6 fL (ref 80.0–100.0)
Platelets: 238 10*3/uL (ref 150–400)
RBC: 4.68 MIL/uL (ref 4.22–5.81)
RDW: 12.7 % (ref 11.5–15.5)
WBC: 10.7 10*3/uL — ABNORMAL HIGH (ref 4.0–10.5)
nRBC: 0 % (ref 0.0–0.2)

## 2022-10-23 LAB — TROPONIN I (HIGH SENSITIVITY)
Troponin I (High Sensitivity): 13 ng/L (ref <18)
Troponin I (High Sensitivity): 21 ng/L — ABNORMAL HIGH (ref <18)

## 2022-10-23 LAB — ACETAMINOPHEN LEVEL: Acetaminophen (Tylenol), Serum: <10 ug/mL — ABNORMAL LOW (ref 10–30)

## 2022-10-23 LAB — MRSA NEXT GEN BY PCR, NASAL: MRSA by PCR Next Gen: NOT DETECTED

## 2022-10-23 LAB — ETHANOL: Alcohol, Ethyl (B): <10 mg/dL (ref <10)

## 2022-10-23 LAB — BETA-HYDROXYBUTYRIC ACID
Beta-Hydroxybutyric Acid: 0.12 mmol/L (ref 0.05–0.27)
Beta-Hydroxybutyric Acid: 0.12 mmol/L (ref 0.05–0.27)
Beta-Hydroxybutyric Acid: 0.12 mmol/L (ref 0.05–0.27)

## 2022-10-23 LAB — HIV ANTIBODY (ROUTINE TESTING W REFLEX): HIV Screen 4th Generation wRfx: NONREACTIVE

## 2022-10-23 LAB — PROTEIN AND GLUCOSE, CSF
Glucose, CSF: 214 mg/dL — ABNORMAL HIGH (ref 40–70)
Total  Protein, CSF: 92 mg/dL — ABNORMAL HIGH (ref 15–45)

## 2022-10-23 LAB — PROTIME-INR
INR: 1.2 (ref 0.8–1.2)
Prothrombin Time: 14.6 seconds (ref 11.4–15.2)

## 2022-10-23 LAB — RESP PANEL BY RT-PCR (RSV, FLU A&B, COVID)  RVPGX2
Influenza A by PCR: NEGATIVE
Influenza B by PCR: NEGATIVE
Resp Syncytial Virus by PCR: NEGATIVE
SARS Coronavirus 2 by RT PCR: NEGATIVE

## 2022-10-23 LAB — MAGNESIUM: Magnesium: 1.6 mg/dL — ABNORMAL LOW (ref 1.7–2.4)

## 2022-10-23 LAB — CBG MONITORING, ED
Glucose-Capillary: 467 mg/dL — ABNORMAL HIGH (ref 70–99)
Glucose-Capillary: 416 mg/dL — ABNORMAL HIGH (ref 70–99)

## 2022-10-23 LAB — CK: Total CK: 72 U/L (ref 49–397)

## 2022-10-23 LAB — HEMOGLOBIN A1C
Hgb A1c MFr Bld: 11.5 % — ABNORMAL HIGH (ref 4.8–5.6)
Mean Plasma Glucose: 283.35 mg/dL

## 2022-10-23 LAB — SALICYLATE LEVEL: Salicylate Lvl: <7 mg/dL — ABNORMAL LOW (ref 7.0–30.0)

## 2022-10-23 LAB — PROCALCITONIN: Procalcitonin: <0.1 ng/mL

## 2022-10-23 MED ORDER — LEVETIRACETAM IN NACL 500 MG/100ML IV SOLN
500.0000 mg | Freq: Two times a day (BID) | INTRAVENOUS | Status: DC
  Administered 2022-10-24 – 2022-10-27 (×9): 500 mg via INTRAVENOUS
  Filled 2022-10-23 (×9): qty 100

## 2022-10-23 MED ORDER — DEXAMETHASONE SODIUM PHOSPHATE 10 MG/ML IJ SOLN
10.0000 mg | Freq: Once | INTRAMUSCULAR | Status: AC
  Administered 2022-10-23: 10 mg via INTRAVENOUS
  Filled 2022-10-23: qty 1

## 2022-10-23 MED ORDER — SODIUM CHLORIDE 0.9 % IV SOLN: 2.0000 g | Freq: Once | INTRAVENOUS | Status: DC

## 2022-10-23 MED ORDER — POLYETHYLENE GLYCOL 3350 17 G PO PACK: 17.0000 g | PACK | Freq: Every day | ORAL | Status: DC | PRN

## 2022-10-23 MED ORDER — SODIUM CHLORIDE 0.9 % IV SOLN: INTRAVENOUS | Status: DC | PRN

## 2022-10-23 MED ORDER — POTASSIUM CHLORIDE 20 MEQ PO PACK: 40.0000 meq | PACK | Freq: Once | ORAL | Status: DC

## 2022-10-23 MED ORDER — GADOBUTROL 1 MMOL/ML IV SOLN
10.0000 mL | Freq: Once | INTRAVENOUS | Status: AC | PRN
  Administered 2022-10-23: 10 mL via INTRAVENOUS

## 2022-10-23 MED ORDER — LACTATED RINGERS IV SOLN: INTRAVENOUS | Status: DC

## 2022-10-23 MED ORDER — DEXTROSE 5 % IV SOLN
1000.0000 mg | Freq: Three times a day (TID) | INTRAVENOUS | Status: DC
  Administered 2022-10-23: 1000 mg via INTRAVENOUS
  Filled 2022-10-23 (×3): qty 20

## 2022-10-23 MED ORDER — PROSOURCE TF20 ENFIT COMPATIBL EN LIQD
60.0000 mL | Freq: Two times a day (BID) | ENTERAL | Status: DC
  Administered 2022-10-24 – 2022-10-25 (×3): 60 mL
  Filled 2022-10-23 (×3): qty 60

## 2022-10-23 MED ORDER — SODIUM CHLORIDE 0.9 % IV SOLN: 2.0000 g | Freq: Two times a day (BID) | INTRAVENOUS | Status: DC

## 2022-10-23 MED ORDER — MIDAZOLAM HCL 2 MG/2ML IJ SOLN
INTRAMUSCULAR | Status: AC
  Administered 2022-10-23: 2 mg via INTRAVENOUS
  Filled 2022-10-23: qty 2

## 2022-10-23 MED ORDER — VANCOMYCIN HCL IN DEXTROSE 1-5 GM/200ML-% IV SOLN: 1000.0000 mg | Freq: Once | INTRAVENOUS | Status: DC

## 2022-10-23 MED ORDER — SODIUM CHLORIDE 0.9 % IV SOLN: 1.0000 g | Freq: Once | INTRAVENOUS | Status: DC

## 2022-10-23 MED ORDER — MAGNESIUM SULFATE 2 GM/50ML IV SOLN
2.0000 g | Freq: Once | INTRAVENOUS | Status: AC
  Administered 2022-10-23: 2 g via INTRAVENOUS
  Filled 2022-10-23: qty 50

## 2022-10-23 MED ORDER — INSULIN REGULAR(HUMAN) IN NACL 100-0.9 UT/100ML-% IV SOLN
INTRAVENOUS | Status: DC
  Administered 2022-10-23: 8.5 [IU]/h via INTRAVENOUS
  Filled 2022-10-23: qty 100

## 2022-10-23 MED ORDER — ORAL CARE MOUTH RINSE
15.0000 mL | OROMUCOSAL | Status: DC
  Administered 2022-10-23 – 2022-10-25 (×24): 15 mL via OROMUCOSAL

## 2022-10-23 MED ORDER — DOCUSATE SODIUM 100 MG PO CAPS: 100.0000 mg | ORAL_CAPSULE | Freq: Two times a day (BID) | ORAL | Status: DC | PRN

## 2022-10-23 MED ORDER — PROPOFOL 1000 MG/100ML IV EMUL
5.0000 ug/kg/min | INTRAVENOUS | Status: DC
  Administered 2022-10-23: 5 ug/kg/min via INTRAVENOUS
  Filled 2022-10-23 (×2): qty 100

## 2022-10-23 MED ORDER — SODIUM CHLORIDE 0.9 % IV SOLN
2.0000 g | Freq: Two times a day (BID) | INTRAVENOUS | Status: DC
  Administered 2022-10-23: 2 g via INTRAVENOUS

## 2022-10-23 MED ORDER — VANCOMYCIN HCL 1500 MG/300ML IV SOLN: 1500.0000 mg | Freq: Two times a day (BID) | INTRAVENOUS | Status: DC

## 2022-10-23 MED ORDER — HYDROMORPHONE HCL 1 MG/ML IJ SOLN
1.0000 mg | INTRAMUSCULAR | Status: DC | PRN
  Administered 2022-10-24 – 2022-10-25 (×4): 1 mg via INTRAVENOUS
  Filled 2022-10-23 (×4): qty 1

## 2022-10-23 MED ORDER — VANCOMYCIN HCL IN DEXTROSE 1-5 GM/200ML-% IV SOLN
1000.0000 mg | Freq: Three times a day (TID) | INTRAVENOUS | Status: DC
  Administered 2022-10-23 – 2022-10-24 (×3): 1000 mg via INTRAVENOUS
  Filled 2022-10-23 (×3): qty 200

## 2022-10-23 MED ORDER — NOREPINEPHRINE 4 MG/250ML-% IV SOLN: 0.0000 ug/min | INTRAVENOUS | Status: DC

## 2022-10-23 MED ORDER — LACTATED RINGERS IV BOLUS: 20.0000 mL/kg | Freq: Once | INTRAVENOUS | Status: DC

## 2022-10-23 MED ORDER — DEXMEDETOMIDINE HCL IN NACL 400 MCG/100ML IV SOLN
0.0000 ug/kg/h | INTRAVENOUS | Status: DC
  Administered 2022-10-24: 0.4 ug/kg/h via INTRAVENOUS
  Administered 2022-10-24: 0.5 ug/kg/h via INTRAVENOUS
  Administered 2022-10-24: 0.6 ug/kg/h via INTRAVENOUS
  Administered 2022-10-25: 0.4 ug/kg/h via INTRAVENOUS
  Filled 2022-10-23 (×4): qty 100

## 2022-10-23 MED ORDER — VANCOMYCIN VARIABLE DOSE PER UNSTABLE RENAL FUNCTION (PHARMACIST DOSING): Status: DC

## 2022-10-23 MED ORDER — POTASSIUM CHLORIDE 10 MEQ/100ML IV SOLN
10.0000 meq | INTRAVENOUS | Status: AC
  Administered 2022-10-23 (×2): 10 meq via INTRAVENOUS
  Filled 2022-10-23 (×2): qty 100

## 2022-10-23 MED ORDER — MIDAZOLAM HCL 2 MG/2ML IJ SOLN: 2.0000 mg | Freq: Once | INTRAMUSCULAR | Status: AC

## 2022-10-23 MED ORDER — LIDOCAINE HCL (PF) 1 % IJ SOLN
5.0000 mL | Freq: Once | INTRAMUSCULAR | Status: AC
  Administered 2022-10-23: 1.5 mL

## 2022-10-23 MED ORDER — SUCCINYLCHOLINE CHLORIDE 200 MG/10ML IV SOSY
200.0000 mg | PREFILLED_SYRINGE | Freq: Once | INTRAVENOUS | Status: AC
  Administered 2022-10-23: 200 mg via INTRAVENOUS

## 2022-10-23 MED ORDER — MIDAZOLAM BOLUS VIA INFUSION: 0.0000 mg | INTRAVENOUS | Status: DC | PRN

## 2022-10-23 MED ORDER — DEXTROSE 5 % IV SOLN: 10.0000 mg/kg | Freq: Three times a day (TID) | INTRAVENOUS | Status: DC

## 2022-10-23 MED ORDER — SODIUM CHLORIDE 0.9 % IV SOLN
2.0000 g | Freq: Once | INTRAVENOUS | Status: DC
  Filled 2022-10-23: qty 20

## 2022-10-23 MED ORDER — DIPHENHYDRAMINE HCL 50 MG/ML IJ SOLN
INTRAMUSCULAR | Status: AC
  Filled 2022-10-23: qty 1

## 2022-10-23 MED ORDER — VANCOMYCIN HCL 2000 MG/400ML IV SOLN
2000.0000 mg | Freq: Once | INTRAVENOUS | Status: AC
  Administered 2022-10-23: 2000 mg via INTRAVENOUS
  Filled 2022-10-23: qty 400

## 2022-10-23 MED ORDER — ASPIRIN 81 MG PO CHEW
81.0000 mg | CHEWABLE_TABLET | Freq: Every day | ORAL | Status: DC
  Administered 2022-10-24 – 2022-10-26 (×3): 81 mg
  Filled 2022-10-23 (×4): qty 1

## 2022-10-23 MED ORDER — LACTATED RINGERS IV BOLUS (SEPSIS)
1000.0000 mL | Freq: Once | INTRAVENOUS | Status: AC
  Administered 2022-10-23: 1000 mL via INTRAVENOUS

## 2022-10-23 MED ORDER — SODIUM CHLORIDE 0.9 % IV SOLN
2.0000 g | INTRAVENOUS | Status: DC
  Filled 2022-10-23 (×2): qty 2000

## 2022-10-23 MED ORDER — DEXTROSE 50 % IV SOLN: 0.0000 mL | INTRAVENOUS | Status: DC | PRN

## 2022-10-23 MED ORDER — ORAL CARE MOUTH RINSE: 15.0000 mL | OROMUCOSAL | Status: DC | PRN

## 2022-10-23 MED ORDER — HYDROMORPHONE HCL 1 MG/ML IJ SOLN
2.0000 mg | Freq: Once | INTRAMUSCULAR | Status: AC
  Administered 2022-10-23: 2 mg via INTRAVENOUS
  Filled 2022-10-23: qty 2

## 2022-10-23 MED ORDER — LIDOCAINE HCL (CARDIAC) PF 100 MG/5ML IV SOSY
100.0000 mg | PREFILLED_SYRINGE | Freq: Once | INTRAVENOUS | Status: AC
  Administered 2022-10-23: 100 mg via INTRAVENOUS

## 2022-10-23 MED ORDER — CHLORHEXIDINE GLUCONATE CLOTH 2 % EX PADS
6.0000 | MEDICATED_PAD | Freq: Every day | CUTANEOUS | Status: DC
  Administered 2022-10-24 – 2022-10-28 (×6): 6 via TOPICAL

## 2022-10-23 MED ORDER — METRONIDAZOLE 500 MG/100ML IV SOLN
500.0000 mg | Freq: Once | INTRAVENOUS | Status: AC
  Administered 2022-10-23: 500 mg via INTRAVENOUS
  Filled 2022-10-23: qty 100

## 2022-10-23 MED ORDER — DEXTROSE 5 % IV SOLN
10.0000 mg/kg | Freq: Once | INTRAVENOUS | Status: DC
  Filled 2022-10-23: qty 19.8

## 2022-10-23 MED ORDER — MIDAZOLAM-SODIUM CHLORIDE 100-0.9 MG/100ML-% IV SOLN
0.5000 mg/h | INTRAVENOUS | Status: DC
  Administered 2022-10-23: 0.5 mg/h via INTRAVENOUS
  Filled 2022-10-23: qty 100

## 2022-10-23 MED ORDER — SODIUM CHLORIDE 0.9 % IV SOLN
2.0000 g | Freq: Two times a day (BID) | INTRAVENOUS | Status: DC
  Administered 2022-10-23 – 2022-10-24 (×2): 2 g via INTRAVENOUS
  Filled 2022-10-23 (×2): qty 20

## 2022-10-23 MED ORDER — LEVETIRACETAM IN NACL 1000 MG/100ML IV SOLN
1000.0000 mg | Freq: Once | INTRAVENOUS | Status: AC
  Administered 2022-10-23: 1000 mg via INTRAVENOUS
  Filled 2022-10-23: qty 100

## 2022-10-23 MED ORDER — SODIUM CHLORIDE 0.9 % IV SOLN
2.0000 g | INTRAVENOUS | Status: DC
  Administered 2022-10-23 – 2022-10-24 (×6): 2 g via INTRAVENOUS
  Filled 2022-10-23 (×8): qty 2000

## 2022-10-23 MED ORDER — SODIUM CHLORIDE 0.9 % IV SOLN
2.0000 g | Freq: Once | INTRAVENOUS | Status: DC
  Administered 2022-10-23: 2 g via INTRAVENOUS
  Filled 2022-10-23: qty 12.5

## 2022-10-23 MED ORDER — NOREPINEPHRINE 4 MG/250ML-% IV SOLN: 2.0000 ug/min | INTRAVENOUS | Status: DC

## 2022-10-23 MED ORDER — MIDAZOLAM-SODIUM CHLORIDE 100-0.9 MG/100ML-% IV SOLN
0.0000 mg/h | INTRAVENOUS | Status: DC
  Administered 2022-10-23: 4 mg/h via INTRAVENOUS
  Filled 2022-10-23: qty 100

## 2022-10-23 MED ORDER — HEPARIN SODIUM (PORCINE) 5000 UNIT/ML IJ SOLN
5000.0000 [IU] | Freq: Three times a day (TID) | INTRAMUSCULAR | Status: DC
  Administered 2022-10-23 – 2022-10-31 (×24): 5000 [IU] via SUBCUTANEOUS
  Filled 2022-10-23 (×24): qty 1

## 2022-10-23 MED ORDER — DOCUSATE SODIUM 50 MG/5ML PO LIQD
100.0000 mg | Freq: Two times a day (BID) | ORAL | Status: DC
  Administered 2022-10-24 (×2): 100 mg
  Filled 2022-10-23 (×2): qty 10

## 2022-10-23 MED ORDER — INSULIN REGULAR(HUMAN) IN NACL 100-0.9 UT/100ML-% IV SOLN
INTRAVENOUS | Status: DC
  Administered 2022-10-23: 4 [IU]/h via INTRAVENOUS
  Filled 2022-10-23 (×2): qty 100

## 2022-10-23 MED ORDER — VITAL 1.5 CAL PO LIQD
1000.0000 mL | ORAL | Status: DC
  Administered 2022-10-24: 1000 mL
  Filled 2022-10-23: qty 1000

## 2022-10-23 MED ORDER — DEXTROSE 50 % IV SOLN
0.0000 mL | INTRAVENOUS | Status: DC | PRN
  Filled 2022-10-23: qty 50

## 2022-10-23 MED ORDER — DEXTROSE IN LACTATED RINGERS 5 % IV SOLN: INTRAVENOUS | Status: DC

## 2022-10-23 MED ORDER — NOREPINEPHRINE 4 MG/250ML-% IV SOLN
INTRAVENOUS | Status: AC
  Filled 2022-10-23: qty 250

## 2022-10-23 MED ORDER — ETOMIDATE 2 MG/ML IV SOLN
20.0000 mg | Freq: Once | INTRAVENOUS | Status: AC
  Administered 2022-10-23: 20 mg via INTRAVENOUS

## 2022-10-23 MED ORDER — SODIUM CHLORIDE 0.9 % IV SOLN
250.0000 mL | INTRAVENOUS | Status: DC
  Administered 2022-10-23 (×2): 250 mL via INTRAVENOUS

## 2022-10-23 MED ORDER — DIPHENHYDRAMINE HCL 50 MG/ML IJ SOLN
50.0000 mg | Freq: Once | INTRAMUSCULAR | Status: AC
  Administered 2022-10-23: 50 mg via INTRAVENOUS

## 2022-10-23 MED ORDER — POLYETHYLENE GLYCOL 3350 17 G PO PACK: 17.0000 g | PACK | Freq: Every day | ORAL | Status: DC

## 2022-10-23 MED ORDER — FAMOTIDINE 20 MG PO TABS
20.0000 mg | ORAL_TABLET | Freq: Two times a day (BID) | ORAL | Status: DC
  Administered 2022-10-24 – 2022-10-25 (×3): 20 mg
  Filled 2022-10-23 (×3): qty 1

## 2022-10-23 MED ORDER — PROPOFOL 1000 MG/100ML IV EMUL
0.0000 ug/kg/min | INTRAVENOUS | Status: DC
  Administered 2022-10-23: 30 ug/kg/min via INTRAVENOUS
  Administered 2022-10-23 – 2022-10-24 (×2): 20 ug/kg/min via INTRAVENOUS
  Filled 2022-10-23 (×3): qty 100

## 2022-10-23 MED ORDER — DEXTROSE 5 % IV SOLN
850.0000 mg | Freq: Three times a day (TID) | INTRAVENOUS | Status: DC
  Administered 2022-10-23: 850 mg via INTRAVENOUS
  Filled 2022-10-23 (×2): qty 17

## 2022-10-23 MED ORDER — ACETAMINOPHEN 325 MG RE SUPP
975.0000 mg | Freq: Once | RECTAL | Status: AC
  Administered 2022-10-23: 975 mg via RECTAL
  Filled 2022-10-23: qty 3

## 2022-10-23 NOTE — ED Notes (Signed)
[facility] called with bed 4N BED 18 call report to [phone]

## 2022-10-23 NOTE — Consult Note (Signed)
[place] Nurse Consult Note: Reason for Consult: left foot wound Wound type: full thickness, neuropathic in the presence of PAD. 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 Followed by [facility] podiatry  Pressure Injury POA: NA Measurement: see nursing flow sheets Wound TOI:ZTIW, non granular  Drainage (amount, consistency, odor) not able to assess in images, reviewed last podiatry notes (no significant drainage).  Periwound: epibole with hyperkeratosis  Debrided at last podiatry visit with plans to use TCC for offloading. No mention of this at the time of admission viaEMS this time Dressing procedure/placement/frequency: Silver hydrofiber for recalcitrant wound, top with foam. Change every other day.   Follow up with podiatrist as scheduled.  Re consult if needed, will not follow at this time. Thanks  Nasira Janusz [employer], RN,CWOCN, CNS, [place] [phone])

## 2022-10-23 NOTE — Progress Notes (Signed)
Initial Nutrition Assessment  DOCUMENTATION CODES:   Not applicable  INTERVENTION:   Initiate tube feeding via OG tube once tube advanced: Vital 1.5 at 30 ml/h and increase by 10 ml every 6 hours to goal rate of 60 ml/hr (1440 ml per day) Prosource TF20 60 ml BID  Provides 2320 kcal, 137 gm protein, 1094 ml free water daily  Monitor electrolytes per protocol   NUTRITION DIAGNOSIS:   Increased nutrient needs related to wound healing as evidenced by estimated needs.  GOAL:   Patient will meet greater than or equal to 90% of their needs  MONITOR:   TF tolerance, Vent status  REASON FOR ASSESSMENT:   Consult Enteral/tube feeding initiation and management  ASSESSMENT:   Pt with PMH of DM with partial L foot amputation, HTN, MS admitted with seizures.   Pt discussed during ICU rounds and with RN.  Pt unable to answer any questions. Daughter at bedside and reports dad has lost weight but is unsure how much, maybe related to his uncontrolled DM, foot wound. She feels he has been eating well at home PTA. Pt lives alone and eats out mostly.  Pt remains intubated, plan for LP today. Per MD can start TF after.   Medications reviewed and include: colace, pepcid, miralax D5LR @ 125 ml/hr Insulin drip Levophed @ 2 mcg  Propofol @ 11 ml/hr provides: 290 kcal  Labs reviewed: Na 128, K 5.7, Magnesium 1.6 A1C: 11.5 CBG's: 225-294  47 F OG tube: per xray side port above GE juction, recommends tube advancement; reached to to RN to confirm advancement   NUTRITION - FOCUSED PHYSICAL EXAM:  Flowsheet Row Most Recent Value  Orbital Region No depletion  Upper Arm Region Moderate depletion  Thoracic and Lumbar Region No depletion  Buccal Region No depletion  Temple Region No depletion  Clavicle Bone Region No depletion  Clavicle and Acromion Bone Region No depletion  Scapular Bone Region Unable to assess  Dorsal Hand No depletion  Patellar Region No depletion  Anterior Thigh  Region No depletion  Posterior Calf Region No depletion  Edema (RD Assessment) None  Hair Reviewed  Eyes Unable to assess  Mouth Unable to assess  Skin Reviewed  Nails Reviewed       Diet Order:   Diet Order             Diet NPO time specified  Diet effective now                   EDUCATION NEEDS:   Not appropriate for education at this time  Skin:  Skin Assessment: Skin Integrity Issues: Skin Integrity Issues:: Stage I, Diabetic Ulcer Stage I: B buttocks Diabetic Ulcer: L foot  Last BM:  unknown  Height:   Ht Readings from Last 1 Encounters:  10/23/22 5\' 11"  (1.803 m)    Weight:   Wt Readings from Last 1 Encounters:  10/23/22 99 kg    BMI:  There is no height or weight on file to calculate BMI.  Estimated Nutritional Needs:   Kcal:  3818-2993  Protein:  115-130 grams  Fluid:  > 2 L/day  Lockie Pares., RD, LDN, CNSC See AMiON for contact information

## 2022-10-23 NOTE — Progress Notes (Signed)
Pharmacy Electrolyte Replacement  Recent Labs:  Recent Labs    10/23/22 0355 10/23/22 0605 10/23/22 1624  K 3.9   < > 3.4*  MG 1.6*  --   --   CREATININE 1.15   < > 1.04   < > = values in this interval not displayed.    Low Critical Values (K </= 2.5, Phos </= 1, Mg </= 1) Present: None   Plan: Potassium chloride 73mEq per tube   Sherlon Handing, PharmD, BCPS Please see amion for complete clinical pharmacist phone list 10/23/2022 5:51 PM

## 2022-10-23 NOTE — Progress Notes (Addendum)
[facility]Link Physician-Brief Progress Note Patient Name: [name] DOB: Aug 08, 1971 MRN: [id number]   Date of Service  10/23/2022  HPI/Events of Note  OG tube hubbed but malpositioned and recommended advancement - reviewed image, seems like it's coiled on itself and in the distal esophagus or stomach.  Hypertensive and agitated, more tachycardic, off levo now. Persisting at >030 systolic  eICU Interventions  Reviewed tubes and pics, readjusted and repeat kub ordered  Added one time dilaudid. Fentanyl allergy noted. May need to add standing prn depending on response.   2320: Agitation and BP significantly improved, discussed with neuro at bedside. Based off EEG, they might adjust AEDs given whole body tremoring. Otherwise, they'd like to transition propofol to precedex and use dilaudid for pain (seems to respond well so far). Use versed pushes only for breakthrough agitation.  Intervention Category Minor Interventions: Agitation / anxiety - evaluation and management;Other:  [name] 10/23/2022, 10:46 PM

## 2022-10-23 NOTE — ED Triage Notes (Incomplete)
EMS states "called out to sister home for seizure.  

## 2022-10-23 NOTE — Consult Note (Signed)
Neurology Consultation Reason for Consult: Seizure Requesting Physician: Margaretha Seeds  CC: Seizure, fever  History is obtained from: Chart review and bedside team  HPI: [name] is a 52 y.o. male with a past medical history significant for relapsing/remitting multiple sclerosis (follows with Dr. Felecia Shelling on teriflunomide), diabetes with left foot wound,   EMS notes he had ongoing seizure activity for approximately 15 minutes prior to their arrival, for which they administered 5 mg of Versed with resolution of this activity once nearly at [facility].  They noted he was shaking on the right side only with right pupil larger than the left.  On ED arrival he was somnolent and intubated for airway protection.  Subsequently he was coming around but agitated, trying to bite staff for which sedation was increased with propofol and Versed.  He was also found to be febrile to Tmax of 101.4 and given 1 g of Keppra.  Labs were also consistent with possible DKA given hyperglycemia and anion gap   Course was complicated by diffuse rash treated with benadryl (noted after fentanyl given), brief hypotension during transport attributed to sedation and improving with sedation held   ROS: All other review of systems was negative except as noted in the HPI.   Past Medical History:  Diagnosis Date   Anxiety    Diabetes mellitus without complication ([place])    Hypertension    MS (multiple sclerosis) ([place])    Past Surgical History:  Procedure Laterality Date   AMPUTATION Left 10/02/2017   Procedure: AMPUTATION RAY-LEFT 5TH;  Surgeon: Sharlotte Alamo, DPM;  Location: [facility] ORS;  Service: Podiatry;  Laterality: Left;   CATARACT EXTRACTION, BILATERAL     TOE AMPUTATION     Current Outpatient Medications  Medication Instructions   ammonium lactate (LAC-HYDRIN) 12 % lotion 1 Application, Topical, 2 times daily   baclofen (LIORESAL) 10 MG tablet 1/2 to 1 pill po tid   brimonidine (ALPHAGAN) 0.2 % ophthalmic solution 1  drop, Both Eyes, 3 times daily   busPIRone (BUSPAR) 5 MG tablet TAKE 1 TABLET(5 MG) BY MOUTH EVERY DAY   clindamycin (CLEOCIN) 300 mg, Oral, 4 times daily   Continuous Blood Gluc Sensor (FREESTYLE LIBRE SENSOR SYSTEM) MISC U UTD Q 10 DAYS.   finasteride (PROSCAR) 5 mg, Oral, Daily   hydrochlorothiazide (HYDRODIURIL) 12.5 mg, Oral, Daily   insulin aspart (NOVOLOG FLEXPEN) 100 UNIT/ML FlexPen INJECT 55 UNITS UNDER THE SKIN THREE TIMES DAILY WITH MEALS   insulin detemir (LEVEMIR) 40 Units, Subcutaneous, Daily   lisinopril (ZESTRIL) 10 mg, Oral, Daily   Multiple Vitamin (MULTIVITAMIN WITH MINERALS) TABS tablet 1 tablet, Oral, Daily   PARoxetine (PAXIL) 40 mg, Oral, Daily   tamsulosin (FLOMAX) 0.4 mg, Oral, Daily   Teriflunomide 14 MG TABS TAKE 1 TABLET BY MOUTH DAILY.   Vitamin D, Ergocalciferol, (DRISDOL) 1.25 MG (50000 UNIT) CAPS capsule Take 1 capsule by mouth once a week for 26 weeks     No family history on file.  Social History:  reports that he has never smoked. He has never used smokeless tobacco. He reports that he does not drink alcohol and does not use drugs.   Exam: Current vital signs: BP 124/84   Pulse 85   Resp 19   SpO2 99%  Vital signs in last 24 hours: Temp:  [93.7 F (34.3 C)-101.4 F (38.6 C)] 98.3 F (36.8 C) (01/30 0350) Pulse Rate:  [85-152] 85 (01/30 0600) Resp:  [14-38] 19 (01/30 0600) BP: (90-147)/(57-120) 124/84 (01/30  0600) SpO2:  [94 %-100 %] 99 % (01/30 0600) FiO2 (%):  [30 %-60 %] 30 % (01/30 0488) Weight:  [99 kg] 99 kg (01/30 0037)   Physical Exam  Constitutional: Appears well-developed and well-nourished.  Psych: Minimally interactive Eyes: Scleral edema is absent  HENT: ET tube in place MSK: no joint deformities.  Cardiovascular: Normal rate and regular rhythm.  Respiratory: Breathing comfortably on the ventilator, at the ventilator set rate GI: Soft.  No distension.  Skin: Malodorous wound of the left foot   Neuro: Mental  Status: Does not open eyes spontaneously, to voice or noxious stimulation Does not follow any commands Cranial Nerves: II: No blink to threat. Pupils are equal, round, and reactive to light (initially nonreactive, then sluggishly reactive from 3 mm to 2 mm) III,IV, VI/VIII: EOMI absent to VOR V/VII: Facial sensation is absent to corneal brush VIII: No response to voice X/XI: Absent cough/gag XII: Unable to assess tongue protrusion secondary to patient's mental status  Motor/Sensory: Moving right upper extremity more briskly than the left side.  Triple flexion in the right lower extremity, no movement left lower extremity Plantars: Toes are mute bilaterally.  Cerebellar: Unable to assess secondary to patient's mental status    I have reviewed labs in epic and the results pertinent to this consultation are:  Basic Metabolic Panel: Recent Labs  Lab 10/23/22 0010 10/23/22 0355 10/23/22 0605  NA 129* 131* 136  K 3.9 3.9 3.6  CL 92* 99  --   CO2 19* 24  --   GLUCOSE 580* 423*  --   BUN 16 17  --   CREATININE 1.52* 1.15  --   CALCIUM 9.2 8.6*  --   MG  --  1.6*  --     CBC: Recent Labs  Lab 10/23/22 0017 10/23/22 0311 10/23/22 0605  WBC 8.1 9.9  --   NEUTROABS 4.3 9.1*  --   HGB 16.7 14.3 13.6  HCT 50.4 41.1 40.0  MCV 89.4 86.7  --   PLT 323 178  --     Coagulation Studies: Recent Labs    10/23/22 0010  LABPROT 14.6  INR 1.2      I have reviewed the images obtained:  Head CT personally reviewed, agree with radiology no acute intracranial process   CXR clear   UA, UDS neg  Ethanol neg Salicylate neg  Impression: 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  Notably his examination with left greater than right-sided weakness does not match the postictal Todd's phenomena that would be expected after semiology of right-sided shaking (right greater than left-sided weakness would be expected in this case).   Pending lumbar puncture to rule out meningitis and EEG to assess for ongoing or intermittent seizure activity  Recommendations: - Keppra 1 g twice daily, max dose for his current creatinine clearance Estimated Creatinine Clearance: 68.2 mL/min (A) (by C-G formula based on SCr of 1.52 mg/dL (H)).              CrCl 80 to 130 mL/minute/1.73 m2: 500 mg to 1.5 g every 12 hours.             CrCl 50 to <80 mL/minute/1.73 m2: 500 mg to 1 g every 12 hours.             CrCl 30 to <50 mL/minute/1.73 m2: 250 to 750 mg every 12 hours.             CrCl 15  to <30 mL/minute/1.73 m2: 250 to 500 mg every 12 hours.             CrCl <15 mL/minute/1.73 m2: 250 to 500 mg every 24 hours (expert opinion). - Meningitis abx coverage vanc ceftriaxone, ampicillin, acyclovir (noting no clear source of fever other than possibly his leg wound); avoid cefepime as this lowers the seizure threshold - Magnesium level resulted low, appreciate repletion per CCM - Appreciate LP by IR given unsuccessful attempt in ED (please include meningitis/encephalitis PCR panel, ordered separately from meningitis/encephalitis order set)  - MRI brain w/ and w/o when able; MRI C-spine w/ and w/o - LTM EEG - Appreciate management of DKA, foot wound, ventilator and other comorbidities by CCM team - Neurology will follow along  Discussed with CCM at bedside   Lesleigh Noe MD-PhD [facility] [phone] Available 7 AM to 7 PM, outside these hours please contact Neurologist on call listed on AMION    Total critical care time: 60 minutes   Critical care time was exclusive of separately billable procedures and treating other patients.   Critical care was necessary to treat or prevent imminent or life-threatening deterioration.  Discussed with ED provider via phone, CCM team at bedside,   Critical care was time spent personally by me on the following activities: development of treatment plan with patient and/or surrogate as well  as nursing, discussions with consultants/primary team, evaluation of patient's response to treatment, examination of patient, obtaining history from patient or surrogate, ordering and performing treatments and interventions, ordering and review of laboratory studies, ordering and review of radiographic studies, and re-evaluation of patient's condition as needed, as documented above.

## 2022-10-23 NOTE — Progress Notes (Addendum)
Neurology Progress Note  Patient ID: [name] is a 52 y.o. male with a past medical history significant for relapsing/remitting multiple sclerosis (follows with Dr. Felecia Shelling on teriflunomide), uncontrolled diabetes with left foot wound, PAD, diabetic retinopathy, neuropathy, HTN  Presented with fever and new onset seizure activity on 1/30 just after midnight. EMS notes he had ongoing seizure activity for approximately 15 minutes prior to their arrival, for which they administered 5 mg of Versed with resolution of this activity once nearly at [facility].  They noted he was shaking on the right side only with right pupil larger than the left.  On ED arrival he was somnolent and intubated for airway protection.  Subsequently he was coming around but agitated, trying to bite staff for which sedation was increased with propofol and Versed.  He was also found to be febrile to Tmax of 101.4 and given 1 g of Keppra.  Labs were also consistent with possible DKA given hyperglycemia and anion gap    Course was complicated by diffuse rash treated with benadryl (noted after fentanyl given), brief hypotension during transport attributed to sedation and improving with sedation held   On additional review of chart:  "[name] Sakai, MD (Ophthalmology)- 06/28/2022 8:40 AM EDT Formatting of this note might be different from the original. [name] is seen in consultation at the request of Dr. 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 Patient is a difficult historian and did not remember our detailed discussion from the [place] [facility] visit from 05/14/2022. Patient has been losing vision since Jan 2023 gradually.  1. Proliferative diabetic retinopathy both eyes S/p PRP over 5 years ago No diabetic macular edema seen today Patient has significant EZ [eliptoid zone] loss in both eyes. Has had a significant decline in vision over 3 years.  FA was done at the 04/2022 visit, which shows severe ischemic both  eyes with slow arm to eye time. Enlarged FAZ both eyes with severely attenuated vessels. I asked the patient to obtain carotid dopplers to rule out carotid disease. This has not been completed yet. I have asked him to complete this test."  Subjective: Minimally interactive  Shivering/tremoring and biting at the tube event at approximately 10:30 PM  Exam: Current vital signs: BP (!) 152/99 Comment: levo off  Pulse 75   Temp (!) 97.5 F (36.4 C) (Axillary)   Resp 19   SpO2 100%  Vital signs in last 24 hours: Temp:  [93.7 F (34.3 C)-101.4 F (38.6 C)] 97.5 F (36.4 C) (01/30 2000) Pulse Rate:  [67-152] 75 (01/30 2145) Resp:  [0-38] 19 (01/30 2145) BP: (79-173)/(53-120) 152/99 (01/30 2145) SpO2:  [94 %-100 %] 100 % (01/30 2145) FiO2 (%):  [30 %-60 %] 30 % (01/30 2000) Weight:  [99 kg] 99 kg (01/30 0037)   Physical Exam  Constitutional: Appears well-developed and well-nourished.  Psych: Minimally interactive Eyes: Scleral edema is absent  HENT: ET tube in place MSK: no joint deformities.  Cardiovascular: Normal rate and regular rhythm.  Respiratory: Breathing comfortably on the ventilator, at the ventilator set rate GI: Soft.  No distension.    Neuro: Mental Status: Does not open eyes spontaneously, to voice or noxious stimulation Does not follow any commands Cranial Nerves: II: Pupils are equal, round, and reactive to light (sluggishly reactive from 3 mm to 2 mm with slight hippus) III,IV, VI/VIII: VOR deferred due to C-spine significant stenosis  V/VII: Facial sensation is absent to eyelash brush VIII: No response to voice  X/XI: Absent cough/gag XII: Unable to assess tongue protrusion secondary to patient's mental status  Motor/Sensory: Tonight moving bilateral lower extremities with complex movement to noxious stim of the RLE (minimal foot movement to noxious stim of the LLE). Moderate flexion to noxious stim of the LUE. Trace movement of the RUE to noxious  stim Plantars: Toes are mute bilaterally.  Reflexes:  Absent brachioradialis bilaterally, 2+ patellar bilaterally, slightly more brisk on the left than the right, 2+ achilles bilaterally, no clonus Cerebellar: Unable to assess secondary to patient's mental status   Pertinent Data:   Basic Metabolic Panel: Recent Labs  Lab 10/23/22 0010 10/23/22 0355 10/23/22 0605 10/23/22 0847 10/23/22 1624 10/23/22 1827  NA 129* 131* 136 128* 134* 136  K 3.9 3.9 3.6 5.7* 3.4* 3.7  CL 92* 99  --  98 100 103  CO2 19* 24  --  21* 23 21*  GLUCOSE 580* 423*  --  397* 199* 209*  BUN 16 17  --  15 15 14   CREATININE 1.52* 1.15  --  1.18 1.04 0.98  CALCIUM 9.2 8.6*  --  8.5* 9.3 9.1  MG  --  1.6*  --   --   --   --     CBC: Recent Labs  Lab 10/23/22 0017 10/23/22 0311 10/23/22 0605 10/23/22 0847  WBC 8.1 9.9  --  10.7*  NEUTROABS 4.3 9.1*  --   --   HGB 16.7 14.3 13.6 14.3  HCT 50.4 41.1 40.0 41.0  MCV 89.4 86.7  --  87.6  PLT 323 178  --  238    Coagulation Studies: Recent Labs    10/23/22 0010  LABPROT 14.6  INR 1.2     Lab Results  Component Value Date   HGBA1C 11.5 (H) 10/23/2022    Latest Reference Range & Units 10/23/22 13:36 10/23/22 14:23  Appearance, CSF CLEAR  CLEAR CLOUDY !  Glucose, CSF 40 - 70 mg/dL  10/25/22 (H) -- Serum 824  RBC Count, CSF 0 /cu mm 395 (H) 12,650 (H)  WBC, CSF 0 - 5 /cu mm 10 (H) 10 (H)  Segmented Neutrophils-CSF 0 - 6 % 3 81 (H)  Lymphs, CSF 40 - 80 % 96 (H) 19 (L)  Monocyte-Macrophage-Spinal Fluid 15 - 45 % 1 (L) 0 (L)  Eosinophils, CSF 0 - 1 % 0 0  Color, CSF COLORLESS  COLORLESS PINK !  Supernatant  NOT INDICATED XANTHOCHROMIC  Total  Protein, CSF 15 - 45 mg/dL  92 (H)  Tube #  4 1  !: Data is abnormal (H): Data is abnormally high (L): Data is abnormally low   Latest Reference Range & Units Most Recent  Cryptococcus neoformans/gattii (CSF) NOT DETECTED  NOT DETECTED 10/23/22 14:23  Cytomegalovirus (CSF) NOT DETECTED  NOT  DETECTED 10/23/22 14:23  Enterovirus (CSF) NOT DETECTED  NOT DETECTED 10/23/22 14:23  Escherichia coli K1 (CSF) NOT DETECTED  NOT DETECTED 10/23/22 14:23  Haemophilus influenzae (CSF) NOT DETECTED  NOT DETECTED 10/23/22 14:23  Herpes simplex virus 1 (CSF) NOT DETECTED  NOT DETECTED 10/23/22 14:23  Herpes simplex virus 2 (CSF) NOT DETECTED  NOT DETECTED 10/23/22 14:23  Human herpesvirus 6 (CSF) NOT DETECTED  NOT DETECTED 10/23/22 14:23  Human parechovirus (CSF) NOT DETECTED  NOT DETECTED 10/23/22 14:23  Listeria monocytogenes (CSF) NOT DETECTED  NOT DETECTED 10/23/22 14:23  Neisseria meningitis (CSF) NOT DETECTED  NOT DETECTED 10/23/22 14:23  Streptococcus agalactiae (CSF) NOT DETECTED  NOT DETECTED 10/23/22 14:23  Streptococcus  pneumoniae (CSF) NOT DETECTED  NOT DETECTED 10/23/22 14:23  Varicella zoster virus (CSF) NOT DETECTED  NOT DETECTED 10/23/22 14:23    MRI brain: 1. No acute intracranial process.  No seizure etiology identified. 2. 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 [occipt 3. Again noted are ovoid T1 hypointense, T2 hyperintense lesions in the cerebral and cerebellar hemispheres and pons, which are favored to represent demyelinating disease, although some of these may be the sequela of chronic small vessel ischemic disease. No abnormal enhancement. 4. Orogastric tube looped in the oropharynx. Repositioning is recommended.  MRI C-spine 1. No evidence of demyelinating disease of the cervical spinal cord. 2. Advanced multilevel degenerative changes of the cervical spine, progressed compared to prior exam. There is now severe spinal canal stenosis at the C4, C5, and C6 vertebral body levels, predominantly due to new ligamentum flavum hypertrophy versus calcification. Recommend further evaluation with a  cervical spine CT for more definitive characterization. 3. Severe neural foraminal stenosis is also present at C4-C5 (left) and C5-C6 (bilateral).  CT-spine 08/20/21 personally reviewed, agree with radiology: "Ligamentum flavum and posterior longitudinal ligament calcifications.  Associated mild osseous central canal stenosis.  No definite severe osseous neural foraminal or central canal stenosis."  Brief review of EEG at time of my exam, initially c/w sleep, reactive with EMG artifact noted after my exam with sedation briefly paused  Patient event as marked did not appear to be epileptic, but formal epileptologist read will be completed tomorrow  Impression: Slight improvement in patient's examination from this morning, follow-up EEG report pending but low concern for ongoing seizure activity at this time.    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 As IgG is more sensitive than PCR for VZV in particular, will add on VZV IgG if able and continue acyclovir for now (especially given his renal function is tolerating this)  Elevated protein and glucose seen is likely secondary to traumatic tap.   Poorly reactive pupils are likely secondary to his known diabetic retinopathy; MS and bilateral occipital strokes are likely contributing to his gradual vision loss as described in notes. Given no acute stroke, will hold off on full inpatient stroke workup but will benefit from CTA head/neck on an outpatient basis if renal function remains stable and would consider ECHO as well.   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  This is likely contributing to his multifactorial gait disorder in addition to multiple sclerosis and peripheral neuropathy  Recommendations:  # Seizure-like activity # Relapsing remitting MS on Aubagio  - VZV IgG CSF add-on requested to lab from tube 4 ([facility] send out, [facility]), protein / glucose in tube 3  - Continue acyclovir for now - Low concern for bacterial meningitis but defer narrowing of antibiotics to CCM given his foot wound  - Wean sedation as tolerated overnight, consider transition to precedex with goal  of discontinuing LTM in the morning if EEG remains reassuring - Continue Keppra at 500 mg BID  (received 1000 mg just after midnight 1/30 early AM)  # 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 - Continue to follow clinical exam, at minimum outpatient neurosurgery evaluation   # Chronic bilateral occipital strokes, new from 2018, but do appear similar on head CT from 07/2021 - A1c significantly above goal of less than 7% - Holding off on checking lipid panel while he is on propofol, long-term LDL goal less than 70 - Aspirin 81 mg daily for secondary prevention should be continued long-term - From neurological perspective outpatient echocardiogram, CTA head and neck would be appropriate at this time unless other acute concerns develop  Appreciate CCM management of ventilator, diabetes, foot wound and other comorbidities   Lesleigh Noe MD-PhD [facility] [phone]   Discussed with CCM team via secure chat as well as via [place] Performed by: Lorenza Chick   Total critical care time: 60 minutes  Critical care time was exclusive of separately billable procedures and treating other patients.  Critical care was necessary to treat or prevent imminent or life-threatening  deterioration.  Critical care was time spent personally by me on the following activities: development of treatment plan with patient and/or surrogate as well as nursing, discussions with consultants, evaluation of patient's response to treatment, examination of patient, obtaining history from patient or surrogate, ordering and performing treatments and interventions, ordering and review of laboratory studies, ordering and review of radiographic studies, pulse oximetry and re-evaluation of patient's condition.

## 2022-10-23 NOTE — Plan of Care (Signed)
Discussed with Dr. Beather Arbour  52 year old man with a past medical history significant for relapsing/remitting multiple sclerosis (follows with Dr. Felecia Shelling on teriflunomide), diabetes with left foot wound,  EMS notes he had ongoing seizure activity for approximately 15 minutes prior to their arrival, for which they administered 5 mg of Versed with resolution of this activity once nearly at [facility].  They noted he was shaking on the right side only with right pupil larger than the left.  On ED arrival he was somnolent and intubated for airway protection.  Subsequently he was coming around but agitated, trying to bite staff for which sedation was increased with propofol and Versed.  He was also found to be febrile to Tmax of 101.4 and given 1 g of Keppra.  Labs were also consistent with possible DKA given hyperglycemia and anion gap (though this may also be partially driven by lactate secondary to seizure activity)  Current Outpatient Medications  Medication Instructions   ammonium lactate (LAC-HYDRIN) 12 % lotion 1 Application, Topical, 2 times daily   baclofen (LIORESAL) 10 MG tablet 1/2 to 1 pill po tid   brimonidine (ALPHAGAN) 0.2 % ophthalmic solution 1 drop, Both Eyes, 3 times daily   busPIRone (BUSPAR) 5 MG tablet TAKE 1 TABLET(5 MG) BY MOUTH EVERY DAY   clindamycin (CLEOCIN) 300 mg, Oral, 4 times daily   Continuous Blood Gluc Sensor (FREESTYLE LIBRE SENSOR SYSTEM) MISC U UTD Q 10 DAYS.   finasteride (PROSCAR) 5 mg, Oral, Daily   hydrochlorothiazide (HYDRODIURIL) 12.5 mg, Oral, Daily   insulin aspart (NOVOLOG FLEXPEN) 100 UNIT/ML FlexPen INJECT 55 UNITS UNDER THE SKIN THREE TIMES DAILY WITH MEALS   insulin detemir (LEVEMIR) 40 Units, Subcutaneous, Daily   lisinopril (ZESTRIL) 10 mg, Oral, Daily   Multiple Vitamin (MULTIVITAMIN WITH MINERALS) TABS tablet 1 tablet, Oral, Daily   PARoxetine (PAXIL) 40 mg, Oral, Daily   tamsulosin (FLOMAX) 0.4 mg, Oral, Daily   Teriflunomide 14 MG TABS TAKE 1 TABLET BY  MOUTH DAILY.   Vitamin D, Ergocalciferol, (DRISDOL) 1.25 MG (50000 UNIT) CAPS capsule Take 1 capsule by mouth once a week for 26 weeks     Current vital signs: BP 121/87   Pulse (!) 112   Temp 99.9 F (37.7 C)   Resp (!) 22   Ht 5\' 11"  (1.803 m)   Wt 99 kg   SpO2 100%   BMI 30.44 kg/m  Vital signs in last 24 hours: Temp:  [93.7 F (34.3 C)-101.4 F (38.6 C)] 99.9 F (37.7 C) (01/30 0215) Pulse Rate:  [112-152] 112 (01/30 0215) Resp:  [14-38] 22 (01/30 0215) BP: (90-147)/(67-113) 121/87 (01/30 0215) SpO2:  [95 %-100 %] 100 % (01/30 0215) FiO2 (%):  [60 %] 60 % (01/30 0015) Weight:  [99 kg] 99 kg (01/30 8416)   Basic Metabolic Panel: Recent Labs  Lab 10/23/22 0010  NA 129*  K 3.9  CL 92*  CO2 19*  GLUCOSE 580*  BUN 16  CREATININE 1.52*  CALCIUM 9.2    CBC: Recent Labs  Lab 10/23/22 0017  WBC 8.1  NEUTROABS 4.3  HGB 16.7  HCT 50.4  MCV 89.4  PLT 323    Coagulation Studies: Recent Labs    10/23/22 0010  LABPROT 14.6  INR 1.2       Recommendations: - Keppra 1 g twice daily, max dose for his current creatinine clearance Estimated Creatinine Clearance: 68.2 mL/min (A) (by C-G formula based on SCr of 1.52 mg/dL (H)).   CrCl 80 to 130  mL/minute/1.73 m2: 500 mg to 1.5 g every 12 hours.  CrCl 50 to <80 mL/minute/1.73 m2: 500 mg to 1 g every 12 hours.  CrCl 30 to <50 mL/minute/1.73 m2: 250 to 750 mg every 12 hours.  CrCl 15 to <30 mL/minute/1.73 m2: 250 to 500 mg every 12 hours.  CrCl <15 mL/minute/1.73 m2: 250 to 500 mg every 24 hours (expert opinion). - Meningitis abx coverage vanc ceftriaxone, ampicillin, acyclovir (noting no clear source of fever other than possibly his leg wound); avoid cefepime as this lowers the seizure threshold - Okay to hold off on steroids given current concern for DKA (did get one dose of dexamethasone)  - Magnesium level added on, replete if needed - Appreciate LP pending by ED, they will attempt to perform based on  emergency consent as family has not been reachable (please include meningitis/encephalitis PCR panel, ordered separately from meningitis/encephalitis order set) - MRI brain w/ and w/o when able - Transfer to [facility] for LTM unless mental status rapidly improving

## 2022-10-23 NOTE — Progress Notes (Signed)
RT NOTE: RT transported patient on ventilator from room 4N18 to fluoro for LP procedure, then from fluoro to MRI, then from MRI back to room 7X48 with no complications.

## 2022-10-23 NOTE — ED Notes (Signed)
No family available for signature for transfer for paperwork

## 2022-10-23 NOTE — Progress Notes (Signed)
eLink Physician-Brief Progress Note Patient Name: [name] DOB: March 10, 1971 MRN: [id number]   Date of Service  10/23/2022  HPI/Events of Note  DM with purulent L foot wound, MS, presented with status epilepticus, intubated and sedated  eICU Interventions  None, ground team present, no immediate needs     Intervention Category Evaluation Type: New Patient Evaluation  Ronel Rodeheaver 10/23/2022, 5:54 AM

## 2022-10-23 NOTE — ED Triage Notes (Addendum)
Pt arrived via [facility] from home where pt had witnessed seizure by family lasting over 10 minutes. No known hx/o seizures. Per EMS, pt unresponsive with non-reactive pupils. Pt given 5mg  Versed in route to ED. Pt breathing on own but not responding to painful stimuli and not able to protect airway on arrival to ED.     MD, nursing staff and respiratory in room for intubation initiation.

## 2022-10-23 NOTE — Progress Notes (Signed)
Inpatient Diabetes Program Recommendations  AACE/ADA: New Consensus Statement on Inpatient Glycemic Control (2015)  Target Ranges:  Prepandial:   less than 140 mg/dL      Peak postprandial:   less than 180 mg/dL (1-2 hours)      Critically ill patients:  140 - 180 mg/dL   Lab Results  Component Value Date   GLUCAP 256 (H) 10/23/2022   HGBA1C 11.5 (H) 10/23/2022    Review of Glycemic Control  Latest Reference Range & Units 10/23/22 08:00 10/23/22 09:36 10/23/22 10:37 10/23/22 11:50  Glucose-Capillary 70 - 99 mg/dL 217 (H) 279 (H) 225 (H) 256 (H)   Diabetes history: DM Outpatient Diabetes medications:  Novolog 55 units tid with meals Levemir 40 units daily Current orders for Inpatient glycemic control:  IV insulin Inpatient Diabetes Program Recommendations:    Note A1C is 11.5 indicating uncontrolled DM (average-283.3 mg/dL). Consider transitioning once blood sugars within goal of 140-180 mg/dL. Will follow.   Thanks,  Adah Perl, RN, BC-ADM Inpatient Diabetes Coordinator Pager [phone]  (8a-5p)

## 2022-10-23 NOTE — ED Notes (Signed)
Open wound noted to bottom of left foot

## 2022-10-23 NOTE — ED Triage Notes (Incomplete)
EMS states "called out to sister home for seizure.

## 2022-10-23 NOTE — ED Notes (Signed)
Report given to Lonn Georgia, RN 4N at [facility]

## 2022-10-23 NOTE — ED Notes (Signed)
Neurology called for Dr. Beather Arbour

## 2022-10-23 NOTE — Consult Note (Signed)
CODE SEPSIS - PHARMACY COMMUNICATION  **Broad Spectrum Antibiotics should be administered within 1 hour of Sepsis diagnosis**  Time Code Sepsis Called/Page Received: 0120  Antibiotics Ordered: cefepime, vancomycin, metronidazole  Time of 1st antibiotic administration: 0153  Additional action taken by pharmacy: n/a  If necessary, Name of Provider/Nurse Contacted: n/a    Dorothe Pea ,PharmD Clinical Pharmacist  10/23/2022  1:21 AM

## 2022-10-23 NOTE — Progress Notes (Signed)
Pharmacy Antibiotic Note  [name] is a 52 y.o. male admitted on 10/23/2022 with seizures and possible meningitis.  Pharmacy has been consulted for Vancomycin and Acyclovir dosing.  Vancomycin 12g IV given at [facility] at  [place]: Vancomycin 1500 mg IV q12h Rocephin 2 g IV q12h Ampicillin 2 g IV q4h Acyclovir 850 mg IV q8h     Temp (24hrs), Avg:99.5 F (37.5 C), Min:93.7 F (34.3 C), Max:101.4 F (38.6 C)  Recent Labs  Lab 10/23/22 0010 10/23/22 0017 10/23/22 0208 10/23/22 0311 10/23/22 0355  WBC  --  8.1  --  9.9  --   CREATININE 1.52*  --   --   --  1.15  LATICACIDVEN >9.0*  --  >9.0*  --   --     Estimated Creatinine Clearance: 90.1 mL/min (by C-G formula based on SCr of 1.15 mg/dL).    Allergies  Allergen Reactions   Shellfish Allergy      [name] 10/23/2022 6:49 AM

## 2022-10-23 NOTE — ED Notes (Signed)
RT called for assistance with transport to CT

## 2022-10-23 NOTE — Progress Notes (Signed)
LTM EEG hooked up and running - no initial skin breakdown - push button tested - [facility] monitoring.  

## 2022-10-23 NOTE — Progress Notes (Signed)
Patient currently has 5 PIV's including 1 USG PIV. If additional access is needed or routine lab work, please consider PICC or central line given acuity.   Dreama Kuna Lorita Officer, RN

## 2022-10-23 NOTE — ED Provider Notes (Signed)
 .  Lumbar Puncture  Date/Time: 10/23/2022 3:42 AM  Performed by: Vladimir Crofts, MD Authorized by: Vladimir Crofts, MD   Consent:    Consent obtained:  Emergent situation Pre-procedure details:    Procedure purpose:  Diagnostic   Preparation: Patient was prepped and draped in usual sterile fashion   Sedation:    Sedation type:  Deep Procedure details:    Lumbar space:  L3-L4 interspace   Patient position:  R lateral decubitus   Needle gauge:  20   Needle type:  Spinal needle - Quincke tip   Needle length (in):  3.5   Ultrasound guidance: no     Number of attempts:  1 Post-procedure details:    Puncture site:  Adhesive bandage applied   Procedure completion:  Procedure terminated electively by provider Comments:     Attempted LP to assist Dr. Beather Arbour with this patient. I was unsuccessful in obtaining CSF and ground transport arrives while I'm attempting. In discussion with Dr. Beather Arbour, he is being empirically treated, and we do not want to delay transport so we will terminate our attempt at LP.      Vladimir Crofts, MD 10/23/22 [phone]

## 2022-10-23 NOTE — H&P (Signed)
NAME:  [name], MRN:  [id number], DOB:  Aug 09, 1971, LOS: 0 ADMISSION DATE:  10/23/2022, CONSULTATION DATE:  1/30 REFERRING MD:  Dr. Beather Arbour EDP , CHIEF COMPLAINT:  Seizure   History of Present Illness:  52 year old male with PMH as below, which is significant for DM, MS on teriflunomide, and HTN who presented to [facility] ED 1/29 with complaint of seizure. His sister found him to be seizing shortly after eating dinner. EMS estimated 10 minutes of total seizure, described as right sided tonic-clonic movements. Resolved with 5 mg versed. The patient remained unresponsive upon arrival to the emergency department and was emergently intubated for airway protections. CT of the head was unremarkable. Laboratory evaluation significant for lactic acid > 9, magnesium 1.6, bicarb 19, glucose 580. He was initiated on treatment for DKA. Neurology was consulted and recommended LP and meningitis coverage as well as transfer to [facility] for continuous EEG. AEDs initiated. Of note, the patient developed hives and hypotension after fentanyl was given, improved with benadryl.  PCCM asked to admit.   Pertinent  Medical History   has a past medical history of Anxiety, Diabetes mellitus without complication ([place]), Hypertension, and MS (multiple sclerosis) ([place]).   Significant Hospital Events: Including procedures, antibiotic start and stop dates in addition to other pertinent events   1/30 admit  Interim History / Subjective:    Objective   Pulse 96, resp. rate 15, SpO2 97 %.    Vent Mode: PRVC FiO2 (%):  [50 %-60 %] 50 % Set Rate:  [18 bmp-20 bmp] 18 bmp Vt Set:  [500 mL-600 mL] 600 mL PEEP:  [5 cmH20] 5 cmH20 Plateau Pressure:  [18 cmH20] 18 cmH20  No intake or output data in the 24 hours ending 10/23/22 0607 There were no vitals filed for this visit.  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 Wound on the plantar surface of the left foot.  Neuro: Sedated    CT head: chronic atrophic and ischemic changes  Resolved Hospital Problem list     Assessment & Plan:   Status epilepticus - Admit to ICU - AED per neurology - EEG monitoring - LP pending - Meningitis antibiotic coverage, holding decadron in the setting of DKA - Propofol and versed   Acute respiratory failure with hypoxia - Full vent support - Repeat CXR - Daily   DKA - vs lactic acidosis following sz - Hydrate - Insulin infusion per endotool protocol - Serial BMP - Holding home levemir - trend lactic acid  AKI - Hydrate - Trend BMP  Hypomagnesemia - replace mag  Multiple sclerosis - holding teriflunomide  Best Practice (right click and "Reselect all SmartList Selections" daily)   Diet/type: NPO DVT prophylaxis: prophylactic heparin  GI prophylaxis: H2B Lines: N/A Foley:  Yes, and it is still needed Code Status:  full code Last date of multidisciplinary goals of care discussion [ ]   Labs   CBC: Recent Labs  Lab 10/23/22 0017 10/23/22 0311  WBC 8.1 9.9  NEUTROABS 4.3 9.1*  HGB 16.7 14.3  HCT 50.4 41.1  MCV 89.4 86.7  PLT 323 371    Basic Metabolic Panel: Recent Labs  Lab 10/23/22 0010 10/23/22 0355  NA 129* 131*  K 3.9 3.9  CL 92* 99  CO2 19* 24  GLUCOSE 580* 423*  BUN 16  17  CREATININE 1.52* 1.15  CALCIUM 9.2 8.6*  MG  --  1.6*   GFR: Estimated Creatinine Clearance: 90.1 mL/min (by C-G formula based on SCr of 1.15 mg/dL). Recent Labs  Lab 10/23/22 0010 10/23/22 0017 10/23/22 0103 10/23/22 0208 10/23/22 0311  PROCALCITON  --   --  <0.10  --   --   WBC  --  8.1  --   --  9.9  LATICACIDVEN >9.0*  --   --  >9.0*  --     Liver Function Tests: Recent Labs  Lab 10/23/22 0010  AST 36  ALT 15  ALKPHOS 121  BILITOT 0.7  PROT 8.5*  ALBUMIN 3.8   No  results for input(s): "LIPASE", "AMYLASE" in the last 168 hours. No results for input(s): "AMMONIA" in the last 168 hours.  ABG    Component Value Date/Time   PHART 7.31 (L) 10/23/2022 0017   PCO2ART 40 10/23/2022 0017   PO2ART 86 10/23/2022 0017   HCO3 20.1 10/23/2022 0017   ACIDBASEDEF 5.8 (H) 10/23/2022 0017   O2SAT 97.9 10/23/2022 0017     Coagulation Profile: Recent Labs  Lab 10/23/22 0010  INR 1.2    Cardiac Enzymes: Recent Labs  Lab 10/23/22 0010  CKTOTAL 72    HbA1C: Hgb A1c MFr Bld  Date/Time Value Ref Range Status  10/02/2017 05:20 AM 9.1 (H) 4.8 - 5.6 % Final    Comment:    (NOTE) Pre diabetes:          5.7%-6.4% Diabetes:              >6.4% Glycemic control for   <7.0% adults with diabetes     CBG: Recent Labs  Lab 10/23/22 0252 10/23/22 0346  GLUCAP 467* 416*    Review of Systems:   Patient is encephalopathic and/or intubated. Therefore history has been obtained from chart review.   Past Medical History:  He,  has a past medical history of Anxiety, Diabetes mellitus without complication ([place]), Hypertension, and MS (multiple sclerosis) ([place]).   Surgical History:   Past Surgical History:  Procedure Laterality Date   AMPUTATION Left 10/02/2017   Procedure: AMPUTATION RAY-LEFT 5TH;  Surgeon: Sharlotte Alamo, DPM;  Location: [facility] ORS;  Service: Podiatry;  Laterality: Left;   CATARACT EXTRACTION, BILATERAL     TOE AMPUTATION       Social History:   reports that he has never smoked. He has never used smokeless tobacco. He reports that he does not drink alcohol and does not use drugs.   Family History:  His family history is not on file.   Allergies Allergies  Allergen Reactions   Shellfish Allergy      Home Medications  Prior to Admission medications   Medication Sig Start Date End Date Taking? Authorizing Provider  ammonium lactate (LAC-HYDRIN) 12 % lotion Apply 1 Application topically in the morning and at bedtime. 10/16/22 10/16/23   [provider]  baclofen (LIORESAL) 10 MG tablet 1/2 to 1 pill po tid 06/18/22   Sater, Nanine Means, MD  brimonidine (ALPHAGAN) 0.2 % ophthalmic solution Place 1 drop into both eyes 3 (three) times daily. 10/16/22   [provider]  busPIRone (BUSPAR) 5 MG tablet TAKE 1 TABLET(5 MG) BY MOUTH EVERY DAY 04/10/21   [provider]  clindamycin (CLEOCIN) 300 MG capsule Take 300 mg by mouth 4 (four) times daily. 10/09/22   [provider]  Continuous Blood Gluc Sensor (FREESTYLE LIBRE SENSOR SYSTEM) MISC U UTD Q  10 DAYS. 06/07/17   [provider]  finasteride (PROSCAR) 5 MG tablet Take 5 mg by mouth daily. 10/09/22   [provider]  hydrochlorothiazide (HYDRODIURIL) 12.5 MG tablet Take 12.5 mg by mouth daily. 03/30/21   [provider]  insulin aspart (NOVOLOG FLEXPEN) 100 UNIT/ML FlexPen INJECT 55 UNITS UNDER THE SKIN THREE TIMES DAILY WITH MEALS 03/09/21   [provider]  insulin detemir (LEVEMIR) 100 UNIT/ML FlexPen Inject 40 Units into the skin daily. 06/30/18   [provider]  lisinopril (ZESTRIL) 10 MG tablet Take 10 mg by mouth daily. 03/30/21   [provider]  Multiple Vitamin (MULTIVITAMIN WITH MINERALS) TABS tablet Take 1 tablet by mouth daily. 10/05/17   Epifanio Lesches, MD  PARoxetine (PAXIL) 20 MG tablet Take 40 mg by mouth daily.  03/06/17   [provider]  tamsulosin (FLOMAX) 0.4 MG CAPS capsule Take 0.4 mg by mouth daily. 10/09/22   [provider]  Teriflunomide 14 MG TABS TAKE 1 TABLET BY MOUTH DAILY. 07/06/22   Sater, Nanine Means, MD  Vitamin D, Ergocalciferol, (DRISDOL) 1.25 MG (50000 UNIT) CAPS capsule Take 1 capsule by mouth once a week for 26 weeks 07/28/20   Sater, Nanine Means, MD     Critical care time: 42 minutes     Georgann Housekeeper, AGACNP-BC [facility]  See Amion for personal pager PCCM on call pager [phone] until 7pm. Please call Elink 7p-7a.  [phone]  10/23/2022 6:54 AM

## 2022-10-23 NOTE — Consult Note (Addendum)
PHARMACY -  BRIEF ANTIBIOTIC NOTE   Pharmacy has received consult(s) for cefepime, acyclovir, and vancomycin from an ED provider.  The patient's profile has been reviewed for ht/wt/allergies/indication/available labs.    One time order(s) placed for  Cefepime 2 gram Vancomycin 2 gram Acyclovir 990 mg   Further antibiotics/pharmacy consults should be ordered by admitting physician if indicated.                       Thank you, Dorothe Pea, PharmD, BCPS Clinical Pharmacist   10/23/2022  1:23 AM

## 2022-10-23 NOTE — Progress Notes (Signed)
Pharmacy Antibiotic Note  [name] is a 52 y.o. male admitted on 10/23/2022 with seizures and possible meningitis.  Pharmacy has been consulted for vancomycin/acyclovir dosing. Also on ceftriaxone and ampicillin. SCr stable 1.18.  Plan: Vancomycin 1g IV q8h. Goal trough 15-20 Ceftriaxone 2g IV q12h Ampicillin 2g IV q4h Acyclovir 1000mg  (10mg /kg TBW) IV q8h + MIVF Monitor clinical progress, c/s, renal function, vancomycin levels at steady state F/u LP results and ability to de-escalate      Temp (24hrs), Avg:99.5 F (37.5 C), Min:93.7 F (34.3 C), Max:101.4 F (38.6 C)  Recent Labs  Lab 10/23/22 0010 10/23/22 0017 10/23/22 0208 10/23/22 0311 10/23/22 0355 10/23/22 0847  WBC  --  8.1  --  9.9  --  10.7*  CREATININE 1.52*  --   --   --  1.15 1.18  LATICACIDVEN >9.0*  --  >9.0*  --   --  3.0*     Estimated Creatinine Clearance: 87.8 mL/min (by C-G formula based on SCr of 1.18 mg/dL).    Allergies  Allergen Reactions   Shellfish Allergy     Arturo Morton, PharmD, BCPS Please check AMION for all [facility] contact numbers Clinical Pharmacist 10/23/2022 10:46 AM

## 2022-10-23 NOTE — ED Notes (Signed)
Fentanyl given by carelink at bedside. Patient began having red rash on abdomen and redness noted to neck. Dr. Beather Arbour notified and verbal order for Benadryl 50mg  IVP

## 2022-10-23 NOTE — ED Notes (Signed)
Procedure (Lumbar Puncture) in progress with ED MD's and nursing staff.

## 2022-10-23 NOTE — ED Notes (Signed)
Repeat purple tube collected per lab request and sent

## 2022-10-23 NOTE — Procedures (Signed)
L4/L5 lumbar puncture yielding 10 ml of blood-tinged CSF. CSF cleared after a few milliliters removed. Opening pressure of 29 cm water.   Please see full dictation under the imaging tab in Epic.  Soyla Dryer, [place] [phone] 10/23/2022, 2:10 PM

## 2022-10-23 NOTE — ED Notes (Signed)
Wound culture taken from left foot and sent to lab

## 2022-10-23 NOTE — ED Notes (Signed)
Patient will respond to painful stimuli when inserting IV

## 2022-10-23 NOTE — ED Notes (Signed)
Patient transported to CT with RN 

## 2022-10-23 NOTE — Sepsis Progress Note (Signed)
Elink monitoring for the code sepsis protocol.  

## 2022-10-23 NOTE — ED Notes (Signed)
ATTEMPT TO CONTACT FAMILY BY PHONE WITH NO ANSWER

## 2022-10-23 NOTE — ED Notes (Signed)
Several attempts at [facility] placement with Lorriane Shire, RN charge nurse with no success

## 2022-10-23 NOTE — Progress Notes (Signed)
Pt transported to CT on the vent and returned to ED 2 on the vent without incident. Pt remains on the vent and is tol well at this time.

## 2022-10-23 NOTE — ED Provider Notes (Signed)
[facility] Provider Note    Event Date/Time   First MD Initiated Contact with Patient 10/23/22 0009     (approximate)   History  Seizure    HPI  Level V caveat: Limited by unresponsive state  [name] is a 52 y.o. male brought to the ED via EMS from home with a chief complaint of seizure.  Patient with history of diabetes and multiple sclerosis on Teriflunomide, no prior history of seizure who was at his sister's house.  She states they ate dinner, then found him with seizure activity.  EMS estimates approximately 10 minutes of seizure activity; noted right-sided tonic-clonic seizure resolved after 5 mg IM Versed given.  Patient arrives to the emergency department unresponsive with gurgling respirations.     Past Medical History   Past Medical History:  Diagnosis Date   Anxiety    Diabetes mellitus without complication (HCC)    Hypertension    MS (multiple sclerosis) (HCC)      Active Problem List   Patient Active Problem List   Diagnosis Date Noted   Muscle spasm 06/18/2022   Vitamin D deficiency 07/26/2020   Other fatigue 07/26/2020   Urinary urgency 07/26/2020   Multiple sclerosis (HCC) 09/10/2018   Ataxic gait 09/10/2018   High risk medication use 09/10/2018   Osteomyelitis of ankle or foot, acute (HCC) 10/01/2017   Varicose veins with pain 07/06/2017   Diabetes mellitus type 2 in obese (HCC) 03/13/2017   Peripheral artery disease (HCC) 03/13/2017   Chronic venous insufficiency 03/13/2017   Hyperlipidemia 03/13/2017   Essential hypertension 03/13/2017     Past Surgical History   Past Surgical History:  Procedure Laterality Date   AMPUTATION Left 10/02/2017   Procedure: 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 Authorizing Provider  ammonium lactate (LAC-HYDRIN) 12 % lotion Apply 1 Application topically in the morning and at bedtime. 10/16/22 10/16/23 Yes [provider]  brimonidine (ALPHAGAN) 0.2 % ophthalmic solution Place 1 drop into both eyes 3 (three) times daily. 10/16/22  Yes [provider]  clindamycin (CLEOCIN) 300 MG capsule Take 300 mg by mouth 4 (four) times daily. 10/09/22  Yes [provider]  finasteride (PROSCAR) 5 MG tablet Take 5 mg by mouth daily. 10/09/22  Yes [provider]  tamsulosin (FLOMAX) 0.4 MG CAPS capsule Take 0.4 mg by mouth daily. 10/09/22  Yes [provider]  baclofen (LIORESAL) 10 MG tablet 1/2 to 1 pill po tid 06/18/22   Sater, Pearletha Furl, MD  busPIRone (BUSPAR) 5 MG tablet TAKE 1 TABLET(5 MG) BY MOUTH EVERY DAY 04/10/21   [provider]  Continuous Blood Gluc Sensor (FREESTYLE LIBRE SENSOR SYSTEM) MISC U UTD Q 10 DAYS. 06/07/17   [provider]  hydrochlorothiazide (HYDRODIURIL) 12.5 MG tablet Take 12.5 mg by mouth daily. 03/30/21   [provider]  insulin aspart (NOVOLOG FLEXPEN) 100 UNIT/ML FlexPen INJECT 55 UNITS UNDER THE SKIN THREE TIMES DAILY WITH MEALS 03/09/21   [provider]  insulin detemir (LEVEMIR) 100 UNIT/ML FlexPen Inject 40 Units into the skin daily. 06/30/18   [provider]  lisinopril (ZESTRIL) 10 MG tablet Take 10 mg by mouth daily. 03/30/21   [provider]  Multiple Vitamin (MULTIVITAMIN WITH MINERALS) TABS tablet Take 1 tablet by mouth daily. 10/05/17   Epifanio Lesches, MD  PARoxetine (PAXIL) 20 MG tablet Take 40 mg by mouth daily.  03/06/17   [provider]  Teriflunomide 14 MG TABS TAKE 1 TABLET BY MOUTH DAILY. 07/06/22   Sater, Nanine Means, MD  Vitamin D, Ergocalciferol, (DRISDOL) 1.25 MG (50000 UNIT) CAPS capsule Take 1 capsule by mouth once a week for 26 weeks 07/28/20   Sater, Nanine Means, MD     Allergies  Shellfish  allergy   Family History  History reviewed. No pertinent family history.   Physical Exam  Triage Vital Signs: ED Triage Vitals  Enc Vitals Group     BP      Pulse      Resp      Temp      Temp src      SpO2      Weight      Height      Head Circumference      Peak Flow      Pain Score      Pain Loc      Pain Edu?      Excl. in [place]?     Updated Vital Signs: BP (!) 101/57 Comment: per EMS  Pulse 99   Temp 98.3 F (36.8 C)   Resp (!) 23   Ht 5\' 11"  (1.803 m)   Wt 99 kg   SpO2 97%   BMI 30.44 kg/m    General: Unresponsive.  CV:  Tachycardic.  Good peripheral perfusion.  Resp:  Gurgling respirations. Abd:  No distention.  Other:  Pinpoint pupils, sluggishly reactive.  Does not respond to painful stimuli.  Left foot: 1x1.5cm open wound with foul odor; s/p transtarsal amputation   ED Results / Procedures / Treatments  Labs (all labs ordered are listed, but only abnormal results are displayed) Labs Reviewed  CBC WITH DIFFERENTIAL/PLATELET - Abnormal; Notable for the following components:      Result Value   Abs Immature Granulocytes 0.14 (*)    All other components within normal limits  LACTIC ACID, PLASMA - Abnormal; Notable for the following components:   Lactic Acid, Venous >9.0 (*)    All other components within normal limits  LACTIC ACID, PLASMA - Abnormal; Notable for the following components:   Lactic Acid, Venous >9.0 (*)    All other components within normal limits  ACETAMINOPHEN LEVEL - Abnormal; Notable for the following components:   Acetaminophen (Tylenol), Serum <10 (*)    All other components within normal limits  SALICYLATE LEVEL - Abnormal; Notable for the following components:   Salicylate Lvl <4.2 (*)    All other components within normal limits  URINALYSIS, ROUTINE W REFLEX MICROSCOPIC - Abnormal; Notable for the following components:   Color, Urine YELLOW (*)    APPearance HAZY (*)    Glucose, UA >=500 (*)    Hgb urine dipstick  SMALL (*)    Protein, ur 100 (*)    All other components within normal limits  BLOOD GAS, ARTERIAL - Abnormal; Notable for the following components:   pH, Arterial 7.31 (*)    Acid-base deficit 5.8 (*)    All other components within normal limits  COMPREHENSIVE METABOLIC PANEL - Abnormal; Notable for the following components:   Sodium 129 (*)    Chloride 92 (*)    CO2 19 (*)    Glucose, Bld 580 (*)    Creatinine, Ser  1.52 (*)    Total Protein 8.5 (*)    GFR, Estimated 55 (*)    Anion gap 18 (*)    All other components within normal limits  BASIC METABOLIC PANEL - Abnormal; Notable for the following components:   Sodium 131 (*)    Glucose, Bld 423 (*)    Calcium 8.6 (*)    All other components within normal limits  MAGNESIUM - Abnormal; Notable for the following components:   Magnesium 1.6 (*)    All other components within normal limits  CBG MONITORING, ED - Abnormal; Notable for the following components:   Glucose-Capillary 467 (*)    All other components within normal limits  CBG MONITORING, ED - Abnormal; Notable for the following components:   Glucose-Capillary 416 (*)    All other components within normal limits  TROPONIN I (HIGH SENSITIVITY) - Abnormal; Notable for the following components:   Troponin I (High Sensitivity) 21 (*)    All other components within normal limits  CULTURE, BLOOD (ROUTINE X 2)  CULTURE, BLOOD (ROUTINE X 2)  RESP PANEL BY RT-PCR (RSV, FLU A&B, COVID)  RVPGX2  AEROBIC/ANAEROBIC CULTURE W GRAM STAIN (SURGICAL/DEEP WOUND)  URINE CULTURE  ETHANOL  URINE DRUG SCREEN, QUALITATIVE ([facility] ONLY)  CK  PROTIME-INR  PROCALCITONIN  BASIC METABOLIC PANEL  BASIC METABOLIC PANEL  BASIC METABOLIC PANEL  CBC WITH DIFFERENTIAL/PLATELET  TYPE AND SCREEN  TROPONIN I (HIGH SENSITIVITY)     EKG  ED ECG REPORT I, Tashima Scarpulla J, the attending physician, personally viewed and interpreted this ECG.   Date: 10/23/2022  EKG Time: 0004  Rate: 150  Rhythm:  sinus tachycardia  Axis: Normal  Intervals:none  ST&T Change: Nonspecific    RADIOLOGY I have independently visualized and interpreted patient's CT and chest x-ray as well as noted the radiology interpretation:  Chest x-ray: No acute cardiopulmonary process  CT head: No ICH  Official radiology report(s): CT Head Wo Contrast  Result Date: 10/23/2022 CLINICAL DATA:  Recent seizure activity with altered mental status EXAM: CT HEAD WITHOUT CONTRAST TECHNIQUE: Contiguous axial images were obtained from the base of the skull through the vertex without intravenous contrast. RADIATION DOSE REDUCTION: This exam was performed according to the departmental dose-optimization program which includes automated exposure control, adjustment of the mA and/or kV according to patient size and/or use of iterative reconstruction technique. COMPARISON:  08/20/2021 FINDINGS: Brain: No evidence of acute infarction, hemorrhage, hydrocephalus, extra-axial collection or mass lesion/mass effect. Mild atrophic changes and chronic white matter ischemic changes are noted. Vascular: No hyperdense vessel or unexpected calcification. Skull: Normal. Negative for fracture or focal lesion. Sinuses/Orbits: No acute finding. Other: Endotracheal tube is noted. IMPRESSION: Chronic atrophic and ischemic changes.  No acute abnormality noted Electronically Signed   By: Inez Catalina M.D.   On: 10/23/2022 01:43   DG Chest Port 1 View  Result Date: 10/23/2022 CLINICAL DATA:  Status post intubation EXAM: PORTABLE CHEST 1 VIEW COMPARISON:  07/15/2017 FINDINGS: Cardiac shadow is stable. Endotracheal tube is noted 2.7 cm above the carina. Lungs are clear but hypoinflated. No bony abnormality is noted. IMPRESSION: No acute abnormality noted. Electronically Signed   By: Inez Catalina M.D.   On: 10/23/2022 00:42     PROCEDURES:  Critical Care performed: Yes, see critical care procedure note(s)  CRITICAL CARE Performed by: Paulette Blanch   Total critical care time: 60 minutes  Critical care time was exclusive of separately billable procedures and treating other patients.  Critical care was  necessary to treat or prevent imminent or life-threatening deterioration.  Critical care was time spent personally by me on the following activities: development of treatment plan with patient and/or surrogate as well as nursing, discussions with consultants, evaluation of patient's response to treatment, examination of patient, obtaining history from patient or surrogate, ordering and performing treatments and interventions, ordering and review of laboratory studies, ordering and review of radiographic studies, pulse oximetry and re-evaluation of patient's condition.   Marland Kitchen1-3 Lead EKG Interpretation  Performed by: Paulette Blanch, MD Authorized by: Paulette Blanch, MD     Interpretation: abnormal     ECG rate:  150   ECG rate assessment: tachycardic     Rhythm: sinus tachycardia     Ectopy: none     Conduction: normal   Comments:     Patient placed on cardiac monitor to evaluate for arrhythmias Procedure Name: Intubation Date/Time: 10/23/2022 3:40 AM  Performed by: Paulette Blanch, MDPre-anesthesia Checklist: Patient identified, Patient being monitored, Emergency Drugs available, Timeout performed and Suction available Oxygen Delivery Method: Non-rebreather mask Preoxygenation: Pre-oxygenation with 100% oxygen Induction Type: Rapid sequence Ventilation: Mask ventilation without difficulty Laryngoscope Size: Glidescope and 3 Tube size: 7.5 mm Number of attempts: 1 Airway Equipment and Method: Rigid stylet Placement Confirmation: ETT inserted through vocal cords under direct vision, CO2 detector, Breath sounds checked- equal and bilateral and Positive ETCO2       MEDICATIONS ORDERED IN ED: Medications  propofol (DIPRIVAN) 1000 MG/100ML infusion (40 mcg/kg/min  99 kg Intravenous Transfusing/Transfer 10/23/22 0426)  midazolam  (VERSED) 100 mg/100 mL (1 mg/mL) premix infusion (6 mg/hr Intravenous Transfusing/Transfer 10/23/22 0426)  acyclovir (ZOVIRAX) 990 mg in dextrose 5 % 250 mL IVPB (has no administration in time range)  insulin regular, human (MYXREDLIN) 100 units/ 100 mL infusion ( Intravenous Transfusing/Transfer 10/23/22 0426)  lactated ringers infusion (has no administration in time range)  dextrose 5 % in lactated ringers infusion (has no administration in time range)  dextrose 50 % solution 0-50 mL (has no administration in time range)  cefTRIAXone (ROCEPHIN) 2 g in sodium chloride 0.9 % 100 mL IVPB (0 g Intravenous Stopped 10/23/22 0343)  norepinephrine (LEVOPHED) 4mg  in 274mL (0.016 mg/mL) premix infusion (2 mcg/kg/min  99 kg Intravenous Given by EMS 10/23/22 0445)  levETIRAcetam (KEPPRA) IVPB 1000 mg/100 mL premix (0 mg Intravenous Stopped 10/23/22 0035)  midazolam (VERSED) injection 2 mg (2 mg Intravenous Given 10/23/22 0030)  lidocaine (cardiac) 100 mg/7mL (XYLOCAINE) injection 2% 100 mg (100 mg Intravenous Given 10/23/22 0005)  etomidate (AMIDATE) injection 20 mg (20 mg Intravenous Given 10/23/22 0006)  succinylcholine (ANECTINE) syringe 200 mg (200 mg Intravenous Given 10/23/22 0006)  acetaminophen (TYLENOL) suppository 975 mg (975 mg Rectal Given 10/23/22 0057)  lactated ringers bolus 1,000 mL (0 mLs Intravenous Stopped 10/23/22 0327)    And  lactated ringers bolus 1,000 mL (0 mLs Intravenous Stopped 10/23/22 0327)    And  lactated ringers bolus 1,000 mL (0 mLs Intravenous Stopped 10/23/22 0327)  metroNIDAZOLE (FLAGYL) IVPB 500 mg (0 mg Intravenous Stopped 10/23/22 0314)  vancomycin (VANCOREADY) IVPB 2000 mg/400 mL (0 mg Intravenous Stopped 10/23/22 0427)  dexamethasone (DECADRON) injection 10 mg (10 mg Intravenous Given 10/23/22 0232)  potassium chloride 10 mEq in 100 mL IVPB (0 mEq Intravenous Stopped 10/23/22 0327)  diphenhydrAMINE (BENADRYL) injection 50 mg (50 mg Intravenous Given 10/23/22 0405)      IMPRESSION / MDM / ASSESSMENT AND PLAN / ED COURSE  I reviewed the triage vital signs and  the nursing notes.                              52 year old male, unresponsive status post seizure-like activity. Differential diagnosis includes, but is not limited to, alcohol, illicit or prescription medications, or other toxic ingestion; intracranial pathology such as stroke or intracerebral hemorrhage; fever or infectious causes including sepsis; hypoxemia and/or hypercarbia; uremia; trauma; endocrine related disorders such as diabetes, hypoglycemia, and thyroid-related diseases; hypertensive encephalopathy; etc. I personally reviewed patient's records and note a recent podiatry office visit for diabetic left foot ulcer.  I have also reviewed his latest [facility] neurology office visit with Dr. Epimenio Foot on 06/18/2022.   Patient's presentation is most consistent with acute presentation with potential threat to life or bodily function.  The patient is on the cardiac monitor to evaluate for evidence of arrhythmia and/or significant heart rate changes.  Patient intubated emergently for airway protection.  Will send for emergent CT head.  Obtain toxicological lab work, chest x-ray.  Clinical Course as of 10/23/22 0519  Tue Oct 23, 2022  0054 WBC normal, cxr/UA unremarkable; lactate >9 consistent with seizure activity. Given fever will cover with broad-spectrum IV abx. [JS]  0133 No family present; left message for wife at home number [phone] [JS]  0204 Spoke with [facility] neurology Dr. Iver Nestle who recommends covering for meningitis with ceftriaxone instead of cefepime, add acyclovir.  Although patient is in DKA, she agrees with single dose Decadron for meningitis treatment.  Will also start Endo tool.  Agrees patient should be transferred for continuous EEG monitoring; recommends ED to ED transfer.  Recommends proceeding with LP. [JS]  0220 Spoke with Dr. Clayborne Dana from [facility] ED who recommends speaking  with ICU. [JS]  0250 Accepted by Dr. Everardo [facility] ICU. [JS]  0341 2 physician attempt at LP unsuccessful - I monitored patient's airway while my partner Dr. Katrinka Blazing attempted LP. [facility] at bedside for transport. Family remains unavailable. [JS]  [id number] Patient developed urticaria after [facility] administered fentanyl.  Antibiotics had been finished; do not think he is reacting to antibiotics.  50 mg IV Benadryl given which cleared urticaria.  Patient now hypotensive; [facility] speaking with intensivist for further orders. [JS]    Clinical Course User Index [JS] Irean Hong, MD     FINAL CLINICAL IMPRESSION(S) / ED DIAGNOSES   Final diagnoses:  Status epilepticus (HCC)  Diabetic ulcer of left midfoot associated with diabetes mellitus due to underlying condition, limited to breakdown of skin (HCC)  Diabetic ketoacidosis with coma associated with type 2 diabetes mellitus (HCC)  Sepsis, due to unspecified organism, unspecified whether acute organ dysfunction present (HCC)  AKI (acute kidney injury) (HCC)  Fever, unspecified fever cause     Rx / DC Orders   ED Discharge Orders     None        Note:  This document was prepared using Dragon voice recognition software and may include unintentional dictation errors.   Irean Hong, MD 10/23/22 [phone]

## 2022-10-24 ENCOUNTER — Inpatient Hospital Stay (HOSPITAL_COMMUNITY): Payer: BLUE CROSS/BLUE SHIELD

## 2022-10-24 ENCOUNTER — Ambulatory Visit: Payer: BLUE CROSS/BLUE SHIELD | Admitting: Neurology

## 2022-10-24 DIAGNOSIS — L8990 Pressure ulcer of unspecified site, unspecified stage: Secondary | ICD-10-CM | POA: Insufficient documentation

## 2022-10-24 DIAGNOSIS — G9340 Encephalopathy, unspecified: Secondary | ICD-10-CM

## 2022-10-24 DIAGNOSIS — G40901 Epilepsy, unspecified, not intractable, with status epilepticus: Secondary | ICD-10-CM | POA: Diagnosis not present

## 2022-10-24 DIAGNOSIS — R569 Unspecified convulsions: Secondary | ICD-10-CM

## 2022-10-24 LAB — GLUCOSE, CAPILLARY
Glucose-Capillary: 163 mg/dL — ABNORMAL HIGH (ref 70–99)
Glucose-Capillary: 177 mg/dL — ABNORMAL HIGH (ref 70–99)
Glucose-Capillary: 181 mg/dL — ABNORMAL HIGH (ref 70–99)
Glucose-Capillary: 187 mg/dL — ABNORMAL HIGH (ref 70–99)
Glucose-Capillary: 195 mg/dL — ABNORMAL HIGH (ref 70–99)
Glucose-Capillary: 196 mg/dL — ABNORMAL HIGH (ref 70–99)
Glucose-Capillary: 198 mg/dL — ABNORMAL HIGH (ref 70–99)
Glucose-Capillary: 199 mg/dL — ABNORMAL HIGH (ref 70–99)
Glucose-Capillary: 203 mg/dL — ABNORMAL HIGH (ref 70–99)
Glucose-Capillary: 219 mg/dL — ABNORMAL HIGH (ref 70–99)
Glucose-Capillary: 226 mg/dL — ABNORMAL HIGH (ref 70–99)
Glucose-Capillary: 297 mg/dL — ABNORMAL HIGH (ref 70–99)

## 2022-10-24 LAB — CBC
HCT: 37.9 % — ABNORMAL LOW (ref 39.0–52.0)
Hemoglobin: 13.1 g/dL (ref 13.0–17.0)
MCH: 30.2 pg (ref 26.0–34.0)
MCHC: 34.6 g/dL (ref 30.0–36.0)
MCV: 87.3 fL (ref 80.0–100.0)
Platelets: 179 10*3/uL (ref 150–400)
RBC: 4.34 MIL/uL (ref 4.22–5.81)
RDW: 12.7 % (ref 11.5–15.5)
WBC: 10 10*3/uL (ref 4.0–10.5)
nRBC: 0 % (ref 0.0–0.2)

## 2022-10-24 LAB — PHOSPHORUS
Phosphorus: 2 mg/dL — ABNORMAL LOW (ref 2.5–4.6)
Phosphorus: 3.8 mg/dL (ref 2.5–4.6)

## 2022-10-24 LAB — TRIGLYCERIDES: Triglycerides: 129 mg/dL (ref <150)

## 2022-10-24 LAB — PROTEIN AND GLUCOSE, CSF
Glucose, CSF: 215 mg/dL — ABNORMAL HIGH (ref 40–70)
Total  Protein, CSF: 92 mg/dL — ABNORMAL HIGH (ref 15–45)

## 2022-10-24 LAB — URINE CULTURE: Culture: NO GROWTH

## 2022-10-24 LAB — BASIC METABOLIC PANEL
Anion gap: 10 (ref 5–15)
BUN: 10 mg/dL (ref 6–20)
CO2: 21 mmol/L — ABNORMAL LOW (ref 22–32)
Calcium: 8.6 mg/dL — ABNORMAL LOW (ref 8.9–10.3)
Chloride: 106 mmol/L (ref 98–111)
Creatinine, Ser: 1.09 mg/dL (ref 0.61–1.24)
GFR, Estimated: >60 mL/min (ref >60)
Glucose, Bld: 184 mg/dL — ABNORMAL HIGH (ref 70–99)
Potassium: 3.3 mmol/L — ABNORMAL LOW (ref 3.5–5.1)
Sodium: 137 mmol/L (ref 135–145)

## 2022-10-24 LAB — MAGNESIUM
Magnesium: 1.6 mg/dL — ABNORMAL LOW (ref 1.7–2.4)
Magnesium: 1.8 mg/dL (ref 1.7–2.4)

## 2022-10-24 MED ORDER — INSULIN DETEMIR 100 UNIT/ML ~~LOC~~ SOLN
7.0000 [IU] | Freq: Once | SUBCUTANEOUS | Status: AC
  Administered 2022-10-24: 7 [IU] via SUBCUTANEOUS
  Filled 2022-10-24: qty 0.07

## 2022-10-24 MED ORDER — INSULIN DETEMIR 100 UNIT/ML ~~LOC~~ SOLN
25.0000 [IU] | Freq: Every day | SUBCUTANEOUS | Status: DC
  Administered 2022-10-24: 25 [IU] via SUBCUTANEOUS
  Filled 2022-10-24: qty 0.25

## 2022-10-24 MED ORDER — INSULIN DETEMIR 100 UNIT/ML ~~LOC~~ SOLN
32.0000 [IU] | Freq: Every day | SUBCUTANEOUS | Status: DC
  Administered 2022-10-25 – 2022-10-28 (×4): 32 [IU] via SUBCUTANEOUS
  Filled 2022-10-24 (×4): qty 0.32

## 2022-10-24 MED ORDER — MIDAZOLAM HCL 2 MG/2ML IJ SOLN
2.0000 mg | INTRAMUSCULAR | Status: DC | PRN
  Administered 2022-10-24 – 2022-10-25 (×2): 2 mg via INTRAVENOUS
  Filled 2022-10-24 (×2): qty 2

## 2022-10-24 MED ORDER — LACTATED RINGERS IV SOLN: INTRAVENOUS | Status: DC

## 2022-10-24 MED ORDER — POTASSIUM PHOSPHATES 15 MMOLE/5ML IV SOLN
15.0000 mmol | Freq: Once | INTRAVENOUS | Status: AC
  Administered 2022-10-24: 15 mmol via INTRAVENOUS
  Filled 2022-10-24: qty 5

## 2022-10-24 MED ORDER — DEXTROSE 5 % IV SOLN
900.0000 mg | Freq: Three times a day (TID) | INTRAVENOUS | Status: DC
  Administered 2022-10-24: 900 mg via INTRAVENOUS
  Filled 2022-10-24 (×2): qty 18

## 2022-10-24 MED ORDER — MAGNESIUM SULFATE 2 GM/50ML IV SOLN
2.0000 g | Freq: Once | INTRAVENOUS | Status: AC
  Administered 2022-10-24: 2 g via INTRAVENOUS
  Filled 2022-10-24: qty 50

## 2022-10-24 MED ORDER — INSULIN ASPART 100 UNIT/ML IJ SOLN
0.0000 [IU] | INTRAMUSCULAR | Status: DC
  Administered 2022-10-24: 5 [IU] via SUBCUTANEOUS
  Administered 2022-10-24: 3 [IU] via SUBCUTANEOUS
  Administered 2022-10-24: 5 [IU] via SUBCUTANEOUS
  Administered 2022-10-24: 8 [IU] via SUBCUTANEOUS
  Administered 2022-10-24: 5 [IU] via SUBCUTANEOUS
  Administered 2022-10-25: 8 [IU] via SUBCUTANEOUS
  Administered 2022-10-25: 2 [IU] via SUBCUTANEOUS
  Administered 2022-10-25: 8 [IU] via SUBCUTANEOUS
  Administered 2022-10-25: 2 [IU] via SUBCUTANEOUS
  Administered 2022-10-25 – 2022-10-26 (×2): 3 [IU] via SUBCUTANEOUS
  Administered 2022-10-26: 11 [IU] via SUBCUTANEOUS
  Administered 2022-10-26: 2 [IU] via SUBCUTANEOUS
  Administered 2022-10-27: 3 [IU] via SUBCUTANEOUS
  Administered 2022-10-27: 2 [IU] via SUBCUTANEOUS
  Administered 2022-10-27: 3 [IU] via SUBCUTANEOUS
  Administered 2022-10-27: 5 [IU] via SUBCUTANEOUS
  Administered 2022-10-27: 2 [IU] via SUBCUTANEOUS

## 2022-10-24 MED ORDER — SODIUM CHLORIDE 0.9 % IV SOLN: 2.0000 g | INTRAVENOUS | Status: DC

## 2022-10-24 MED ORDER — MIDAZOLAM HCL 2 MG/2ML IJ SOLN
2.0000 mg | INTRAMUSCULAR | Status: DC | PRN
  Filled 2022-10-24: qty 2

## 2022-10-24 NOTE — Progress Notes (Addendum)
Subjective: 79 no further seizures overnight.  Weaned off group and Versed.  Currently on Precedex  ROS: Unable to obtain due to poor mental status  Examination  Vital signs in last 24 hours: Temp:  [96.4 F (35.8 C)-99.7 F (37.6 C)] 99.7 F (37.6 C) (01/31 1000) Pulse Rate:  [67-121] 76 (01/31 1000) Resp:  [0-22] 16 (01/31 1000) BP: (85-188)/(53-121) 142/76 (01/31 1000) SpO2:  [96 %-100 %] 98 % (01/31 1000) FiO2 (%):  [30 %] 30 % (01/31 0742) Weight:  [113.6 kg] 113.6 kg (01/31 0401)  General: lying in bed, intubated Neuro: Does not open eyes to noxious stimuli, does not follow commands, PERRLA, corneal reflex intact, gag reflex intact, does not withdraw to noxious stimuli in all 4 extremities  Basic Metabolic Panel: Recent Labs  Lab 10/23/22 0355 10/23/22 0605 10/23/22 0847 10/23/22 1624 10/23/22 1827 10/23/22 2316 10/24/22 0454  NA 131*   < > 128* 134* 136 136 137  K 3.9   < > 5.7* 3.4* 3.7 3.5 3.3*  CL 99  --  98 100 103 105 106  CO2 24  --  21* 23 21* 21* 21*  GLUCOSE 423*  --  397* 199* 209* 199* 184*  BUN 17  --  15 15 14 12 10   CREATININE 1.15  --  1.18 1.04 0.98 1.07 1.09  CALCIUM 8.6*  --  8.5* 9.3 9.1 9.1 8.6*  MG 1.6*  --   --   --   --   --  1.6*  PHOS  --   --   --   --   --   --  2.0*   < > = values in this interval not displayed.    CBC: Recent Labs  Lab 10/23/22 0017 10/23/22 0311 10/23/22 0605 10/23/22 0847 10/24/22 0454  WBC 8.1 9.9  --  10.7* 10.0  NEUTROABS 4.3 9.1*  --   --   --   HGB 16.7 14.3 13.6 14.3 13.1  HCT 50.4 41.1 40.0 41.0 37.9*  MCV 89.4 86.7  --  87.6 87.3  PLT 323 178  --  238 179    Coagulation Studies: Recent Labs    10/23/22 0010  LABPROT 14.6  INR 1.2   Imaging  MRI brain with and without contrast 10/23/2022:  1. No acute intracranial process.  No seizure etiology identified. 2. Confluent increased T2 signal in the periventricular white matter, which appears increased from the prior exam, with new  encephalomalacia in the bilateral occipital lobes, likely the sequela of prior infarcts. 3. Again noted are ovoid T1 hypointense, T2 hyperintense lesions in the cerebral and cerebellar hemispheres and pons, which are favored to represent demyelinating disease, although some of these may be the sequela of chronic small vessel ischemic disease. No abnormal enhancement.  ASSESSMENT AND PLAN: 52 year old male with relapsing remitting MS on [place], chronic bilateral occipital strokes who presented with new onset seizures in the setting of fever (Tmax 101.4 F) and hyperglycemia (blood glucose 508).  Convulsive status epilepticus, resolved New onset seizure -Initially there was suspicion for meningitis because of fever.  Lumbar puncture showed lymphocytic pleocytosis (10) with glucose 214 and protein 92.  Meningitis encephalitis panel is negative.   -Patient is at high risk of seizures due to occipital strokes and may have had lowered seizure threshold in the setting of hyperglycemia  Recommendations -Propofol and Versed have been weaned off overnight.  Will continue LTM EEG overnight and likely DC tomorrow if no seizure -Okay to DC  acyclovir per discussion with pharmacy and critical care team -Continue Keppra 500 mg twice daily -Continue seizure precautions -As needed IV Versed for clinical seizure-like activity -Management of rest of comorbidities per primary team  I have spent a total of  36 minutes with the patient reviewing hospital notes,  test results, labs and examining the patient as well as establishing an assessment and plan.  > 50% of time was spent in direct patient care.   Zeb Comfort Epilepsy [facility] For questions after 5pm please refer to AMION to reach the Neurologist on call

## 2022-10-24 NOTE — Progress Notes (Signed)
NAME:  [name], MRN:  [id number], DOB:  05/24/1971, LOS: 1 ADMISSION DATE:  10/23/2022, CONSULTATION DATE:  1/30 REFERRING MD:  Dr. Beather Arbour EDP , CHIEF COMPLAINT:  Seizure   History of Present Illness:  52 year old male with PMH as below, which is significant for DM, MS on teriflunomide, and HTN who presented to [facility] ED 1/29 with complaint of seizure. His sister found him to be seizing shortly after eating dinner. EMS estimated 10 minutes of total seizure, described as right sided tonic-clonic movements. Resolved with 5 mg versed. The patient remained unresponsive upon arrival to the emergency department and was emergently intubated for airway protections. CT of the head was unremarkable. Laboratory evaluation significant for lactic acid > 9, magnesium 1.6, bicarb 19, glucose 580. He was initiated on treatment for DKA. Neurology was consulted and recommended LP and meningitis coverage as well as transfer to [facility] for continuous EEG. AEDs initiated. Of note, the patient developed hives and hypotension after fentanyl was given, improved with benadryl.  PCCM asked to admit.   Pertinent  Medical History   has a past medical history of Anxiety, Diabetes mellitus without complication ([place]), Hypertension, and MS (multiple sclerosis) ([place]).   Significant Hospital Events: Including procedures, antibiotic start and stop dates in addition to other pertinent events   1/30 admit. LP, MRI. No acute intracranial process. Seen by neuro, possible viral meningitis on CSF, sending out VZV igG 1/31 de-escalate abx . Weaning sedation    Interim History / Subjective:   NAEO  Sedation has been changed now is on dex gtt   Objective   Blood pressure 132/66, pulse 73, temperature 98.2 F (36.8 C), temperature source Bladder, resp. rate 18, weight 113.6 kg, SpO2 98 %.    Vent Mode: PRVC FiO2 (%):  [30 %] 30 % Set Rate:  [18 bmp] 18 bmp Vt Set:  [600 mL] 600 mL PEEP:  [5 cmH20] 5 cmH20 Plateau  Pressure:  [15 cmH20-19 cmH20] 15 cmH20   Intake/Output Summary (Last 24 hours) at 10/24/2022 1017 Last data filed at 10/24/2022 0809 Gross per 24 hour  Intake 5487.37 ml  Output 2755 ml  Net 2732.37 ml   Filed Weights   10/24/22 0401  Weight: 113.6 kg    Examination:  General: critically and chronically ill appearing middle aged M intubated sedated NAD  Neuro: Sedated, does not follow commands. Pupils are 5mm symmetrical  HENT: ETT secure anicteric sclera Lungs: CTAb symmetrical chest expansion, mechanically ventilated  Cardiovascular: rr s1s2 cap refill <3sec  GI: soft +bowel sounds  GU: foley Extremities:  LLE transmetatarsal amputation  Skin: L foot full thickness wound with yellow exudate    Resolved Hospital Problem list   Sedation related hypotension   Assessment & Plan:   Acute encephalopathy Status epilepticus  Possible viral meningitis  rrMS Bilateral occipital strokes  Spinal cord stenosis C4-C6 - neuro following, appreciate recs -BID keppra  -cEEG  -weaning prop versed and transition to precedex - holding teriflunomide -VZV IgG CSF -cont acyclovir, de-escalate abx   -ASA  Acute respiratory failure with hypoxia -wean MV as able  -VAP, pulm hygiene   AKI -BMP pending  -trend renal indices UOP  DM with Hyperglycemia Diabetic foot wound  -transitioning off insulin gtt  -cont IVF  -BMP pending  -WOC  -rocephin for possible cellulitis   Hypomagnesemia  -replace    Best Practice (right click and "Reselect all SmartList Selections" daily)   Diet/type: NPO DVT prophylaxis: prophylactic heparin  GI prophylaxis: H2B Lines: N/A Foley:  Yes, and it is still needed Code Status:  full code Last date of multidisciplinary goals of care discussion [ ]   Labs   CBC: Recent Labs  Lab 10/23/22 0017 10/23/22 0311 10/23/22 0605 10/23/22 0847 10/24/22 0454  WBC 8.1 9.9  --  10.7* 10.0  NEUTROABS 4.3 9.1*  --   --   --   HGB 16.7 14.3 13.6 14.3  13.1  HCT 50.4 41.1 40.0 41.0 37.9*  MCV 89.4 86.7  --  87.6 87.3  PLT 323 178  --  238 578    Basic Metabolic Panel: Recent Labs  Lab 10/23/22 0355 10/23/22 0605 10/23/22 0847 10/23/22 1624 10/23/22 1827 10/23/22 2316 10/24/22 0454  NA 131*   < > 128* 134* 136 136 137  K 3.9   < > 5.7* 3.4* 3.7 3.5 3.3*  CL 99  --  98 100 103 105 106  CO2 24  --  21* 23 21* 21* 21*  GLUCOSE 423*  --  397* 199* 209* 199* 184*  BUN 17  --  15 15 14 12 10   CREATININE 1.15  --  1.18 1.04 0.98 1.07 1.09  CALCIUM 8.6*  --  8.5* 9.3 9.1 9.1 8.6*  MG 1.6*  --   --   --   --   --  1.6*  PHOS  --   --   --   --   --   --  2.0*   < > = values in this interval not displayed.   GFR: Estimated Creatinine Clearance: 101.6 mL/min (by C-G formula based on SCr of 1.09 mg/dL). Recent Labs  Lab 10/23/22 0010 10/23/22 0017 10/23/22 0103 10/23/22 0208 10/23/22 0311 10/23/22 0847 10/24/22 0454  PROCALCITON  --   --  <0.10  --   --   --   --   WBC  --  8.1  --   --  9.9 10.7* 10.0  LATICACIDVEN >9.0*  --   --  >9.0*  --  3.0*  --     Liver Function Tests: Recent Labs  Lab 10/23/22 0010  AST 36  ALT 15  ALKPHOS 121  BILITOT 0.7  PROT 8.5*  ALBUMIN 3.8   No results for input(s): "LIPASE", "AMYLASE" in the last 168 hours. No results for input(s): "AMMONIA" in the last 168 hours.  ABG    Component Value Date/Time   PHART 7.382 10/23/2022 0605   PCO2ART 34.5 10/23/2022 0605   PO2ART 187 (H) 10/23/2022 0605   HCO3 20.5 10/23/2022 0605   TCO2 22 10/23/2022 0605   ACIDBASEDEF 4.0 (H) 10/23/2022 0605   O2SAT 100 10/23/2022 0605     Coagulation Profile: Recent Labs  Lab 10/23/22 0010  INR 1.2    Cardiac Enzymes: Recent Labs  Lab 10/23/22 0010  CKTOTAL 72    HbA1C: Hgb A1c MFr Bld  Date/Time Value Ref Range Status  10/23/2022 08:47 AM 11.5 (H) 4.8 - 5.6 % Final    Comment:    (NOTE) Pre diabetes:          5.7%-6.4%  Diabetes:              >6.4%  Glycemic control for    <7.0% adults with diabetes   10/02/2017 05:20 AM 9.1 (H) 4.8 - 5.6 % Final    Comment:    (NOTE) Pre diabetes:          5.7%-6.4% Diabetes:              >  6.4% Glycemic control for   <7.0% adults with diabetes     CBG: Recent Labs  Lab 10/24/22 0347 10/24/22 0449 10/24/22 0555 10/24/22 0652 10/24/22 0807  GLUCAP 187* 177* 198* 219* 195*    CRITICAL CARE Performed by: Cristal Generous   Total critical care time: 37 minutes  Critical care time was exclusive of separately billable procedures and treating other patients. Critical care was necessary to treat or prevent imminent or life-threatening deterioration.  Critical care was time spent personally by me on the following activities: development of treatment plan with patient and/or surrogate as well as nursing, discussions with consultants, evaluation of patient's response to treatment, examination of patient, obtaining history from patient or surrogate, ordering and performing treatments and interventions, ordering and review of laboratory studies, ordering and review of radiographic studies, pulse oximetry and re-evaluation of patient's condition.  Eliseo Gum MSN, AGACNP-BC [place] for pager  10/24/2022, 9:07 AM

## 2022-10-24 NOTE — Inpatient Diabetes Management (Addendum)
Inpatient Diabetes Program Recommendations  AACE/ADA: New Consensus Statement on Inpatient Glycemic Control (2015)  Target Ranges:  Prepandial:   less than 140 mg/dL      Peak postprandial:   less than 180 mg/dL (1-2 hours)      Critically ill patients:  140 - 180 mg/dL   Lab Results  Component Value Date   GLUCAP 297 (H) 10/24/2022   HGBA1C 11.5 (H) 10/23/2022    Review of Glycemic Control  Latest Reference Range & Units 10/24/22 04:49 10/24/22 05:55 10/24/22 06:52 10/24/22 08:07 10/24/22 11:23  Glucose-Capillary 70 - 99 mg/dL 177 (H) 198 (H) 219 (H) 195 (H) 297 (H)  (H): Data is abnormally high Diabetes history: DM Outpatient Diabetes medications:  Novolog 55 units tid with meals Levemir 40 units daily Current orders for Inpatient glycemic control:  Semglee 25 units QD, Novolog 0-15 units Q4H  Inpatient Diabetes Program Recommendations:    Consider increasing Levemir to 32 units QD.  Thanks, Bronson Curb, MSN, RNC-OB Diabetes Coordinator [phone] (8a-5p)

## 2022-10-24 NOTE — Progress Notes (Signed)
Afternoon round with CCM.  Keep foley over night per CCM.

## 2022-10-24 NOTE — Progress Notes (Signed)
LTM maint complete - no skin breakdown under: Fp1 Fp2 [facility] monitored, Event button test confirmed by [facility].

## 2022-10-24 NOTE — Procedures (Signed)
Patient Name: [name]  MRN: [id number]  Epilepsy Attending: Lora Havens  Referring Physician/Provider: Lorenza Chick, MD  Duration: 10/23/2022 1829 to 10/24/2022 9371  Patient history: 52 year old male presented with seizures.  EEG to evaluate for seizure.  Level of alertness: comatose  AEDs during EEG study: Keppra, Versed, propofol  Technical aspects: This EEG study was done with scalp electrodes positioned according to the 10-20 International system of electrode placement. Electrical activity was reviewed with band pass filter of 1-70Hz , sensitivity of 7 uV/mm, display speed of 24mm/sec with a 60Hz  notched filter applied as appropriate. EEG data were recorded continuously and digitally stored.  Video monitoring was available and reviewed as appropriate.  Description: At the beginning of the study, EEG showed continuous generalized 3-6 hz theta- delta slowing with overriding 12 to 15 Hz generalized beta activity.  As sedation was weaned, EEG showed posterior dominant rhythm of 8 Hz activity of moderate voltage (25-35 uV) seen predominantly in posterior head regions, symmetric and reactive to eye opening and eye closing.EEG also showed intermittent generalized 3 to 6 Hz theta-delta slowing. Hyperventilation and photic stimulation were not performed.     Event button was pressed on 10/23/2022 at 2204.  Patient was noted to be shivering.  Concomitant EEG before, during and after the event did not show any EEG changes suggest seizure.  Event button was pressed on 10/24/2022 at 0611 and [phone] for opening and closing eyes.  Concomitant EEG before, during and after the event did not show any EEG changes suggest seizure.  EKG artifact was seen during the study.  ABNORMALITY -Continuous slow, generalized  IMPRESSION: This study was initially suggestive of severe diffuse encephalopathy, nonspecific etiology but likely related to sedation.  As sedation was weaned, EEG improved and was  suggestive of mild to moderate diffuse encephalopathy, nonspecific to etiology.  No seizures or epileptiform discharges were seen during the study.  No seizures or epileptiform discharges were seen throughout the recording.  Event button was pressed on 10/23/2022 at 2004 for shivering without concomitant EEG change. This was not an epileptic event.  Event button was pressed on 10/24/2022 at 0611 and 0633 but opening and closing eyes without concomitant EEG change.  These were not epileptic events.  Catherin Doorn Barbra Sarks

## 2022-10-24 NOTE — Progress Notes (Signed)
Pharmacy Antibiotic Note  [name] is a 52 y.o. male admitted on 10/23/2022 with possible meningitis. LP completed and suspicion for bacterial meningitis low per Neuro. Pharmacy has been consulted to continue acyclovir dosing for now with VZV IgG CSF add-on pending. Also to continue on ceftriaxone for cellulitis per CCM. SCr down a bit to 1.09. Vancomycin and ampicillin d/c'd.  Plan: Adjust ceftriaxone to 2g IV q24h for cellulitis dosing Acyclovir 900mg  (10mg /kg Adj BW) IV q8h + LR at 125 ml/hr Monitor clinical progress, c/s, renal function F/u LP add-on lab results ordered per Neuro and ability to de-escalate   Weight: 113.6 kg (250 lb 7.1 oz)  Temp (24hrs), Avg:97.8 F (36.6 C), Min:96.4 F (35.8 C), Max:98.8 F (37.1 C)  Recent Labs  Lab 10/23/22 0010 10/23/22 0017 10/23/22 0208 10/23/22 0311 10/23/22 0355 10/23/22 0847 10/23/22 1624 10/23/22 1827 10/23/22 2316 10/24/22 0454  WBC  --  8.1  --  9.9  --  10.7*  --   --   --  10.0  CREATININE 1.52*  --   --   --    < > 1.18 1.04 0.98 1.07 1.09  LATICACIDVEN >9.0*  --  >9.0*  --   --  3.0*  --   --   --   --    < > = values in this interval not displayed.     Estimated Creatinine Clearance: 101.6 mL/min (by C-G formula based on SCr of 1.09 mg/dL).    Allergies  Allergen Reactions   Shellfish Allergy Nausea And Vomiting   Fentanyl Itching, Swelling and Rash    Arturo Morton, PharmD, BCPS Please check AMION for all [place] contact numbers Clinical Pharmacist 10/24/2022 9:38 AM

## 2022-10-24 NOTE — Progress Notes (Signed)
eLink Physician-Brief Progress Note Patient Name: [name] DOB: [birth year] MRN: [id number]   Date of Service  10/24/2022  HPI/Events of Note  Has recent seizures, weaned off continuous sedation, following commands and reporting agitation with bath. Had 1x versed push, wondering about plan for ongoing agitation. Not on versed or prop drips. On precedex at 0.4->0.6 with moderate effect.  eICU Interventions  DC unused orders.  Maintain precedex as primary sedation with expectation to wean to extubate in AM. If patient is immediate danger to self or seizure like activity, use versed push.      Intervention Category Minor Interventions: Agitation / anxiety - evaluation and management  [name] 10/24/2022, 11:00 PM

## 2022-10-25 ENCOUNTER — Inpatient Hospital Stay (HOSPITAL_COMMUNITY): Payer: BLUE CROSS/BLUE SHIELD

## 2022-10-25 DIAGNOSIS — N179 Acute kidney failure, unspecified: Secondary | ICD-10-CM

## 2022-10-25 DIAGNOSIS — G9340 Encephalopathy, unspecified: Secondary | ICD-10-CM

## 2022-10-25 DIAGNOSIS — R569 Unspecified convulsions: Secondary | ICD-10-CM | POA: Diagnosis not present

## 2022-10-25 DIAGNOSIS — J9601 Acute respiratory failure with hypoxia: Secondary | ICD-10-CM

## 2022-10-25 DIAGNOSIS — G40901 Epilepsy, unspecified, not intractable, with status epilepticus: Secondary | ICD-10-CM | POA: Diagnosis not present

## 2022-10-25 LAB — BASIC METABOLIC PANEL
Anion gap: 7 (ref 5–15)
Anion gap: 9 (ref 5–15)
BUN: 15 mg/dL (ref 6–20)
BUN: 13 mg/dL (ref 6–20)
CO2: 24 mmol/L (ref 22–32)
CO2: 26 mmol/L (ref 22–32)
Calcium: 8.2 mg/dL — ABNORMAL LOW (ref 8.9–10.3)
Calcium: 8.4 mg/dL — ABNORMAL LOW (ref 8.9–10.3)
Chloride: 104 mmol/L (ref 98–111)
Chloride: 103 mmol/L (ref 98–111)
Creatinine, Ser: 1 mg/dL (ref 0.61–1.24)
Creatinine, Ser: 0.92 mg/dL (ref 0.61–1.24)
GFR, Estimated: >60 mL/min (ref >60)
GFR, Estimated: >60 mL/min (ref >60)
Glucose, Bld: 265 mg/dL — ABNORMAL HIGH (ref 70–99)
Glucose, Bld: 118 mg/dL — ABNORMAL HIGH (ref 70–99)
Potassium: 3.5 mmol/L (ref 3.5–5.1)
Potassium: 3.2 mmol/L — ABNORMAL LOW (ref 3.5–5.1)
Sodium: 135 mmol/L (ref 135–145)
Sodium: 138 mmol/L (ref 135–145)

## 2022-10-25 LAB — MAGNESIUM
Magnesium: 1.6 mg/dL — ABNORMAL LOW (ref 1.7–2.4)
Magnesium: 9.2 mg/dL (ref 1.7–2.4)
Magnesium: 2 mg/dL (ref 1.7–2.4)

## 2022-10-25 LAB — POCT I-STAT 7, (LYTES, BLD GAS, ICA,H+H)
Acid-Base Excess: 1 mmol/L (ref 0.0–2.0)
Bicarbonate: 24 mmol/L (ref 20.0–28.0)
Calcium, Ion: 1.09 mmol/L — ABNORMAL LOW (ref 1.15–1.40)
HCT: 39 % (ref 39.0–52.0)
Hemoglobin: 13.3 g/dL (ref 13.0–17.0)
O2 Saturation: 95 %
Patient temperature: 38.5
Potassium: 3.2 mmol/L — ABNORMAL LOW (ref 3.5–5.1)
Sodium: 140 mmol/L (ref 135–145)
TCO2: 25 mmol/L (ref 22–32)
pCO2 arterial: 35 mmHg (ref 32–48)
pH, Arterial: 7.451 — ABNORMAL HIGH (ref 7.35–7.45)
pO2, Arterial: 79 mmHg — ABNORMAL LOW (ref 83–108)

## 2022-10-25 LAB — GLUCOSE, CAPILLARY
Glucose-Capillary: 251 mg/dL — ABNORMAL HIGH (ref 70–99)
Glucose-Capillary: 264 mg/dL — ABNORMAL HIGH (ref 70–99)
Glucose-Capillary: 117 mg/dL — ABNORMAL HIGH (ref 70–99)
Glucose-Capillary: 147 mg/dL — ABNORMAL HIGH (ref 70–99)
Glucose-Capillary: 122 mg/dL — ABNORMAL HIGH (ref 70–99)
Glucose-Capillary: 139 mg/dL — ABNORMAL HIGH (ref 70–99)

## 2022-10-25 LAB — PHOSPHORUS
Phosphorus: 3.1 mg/dL (ref 2.5–4.6)
Phosphorus: 2.5 mg/dL (ref 2.5–4.6)

## 2022-10-25 MED ORDER — JUVEN PO PACK: 1.0000 | PACK | Freq: Two times a day (BID) | ORAL | Status: DC

## 2022-10-25 MED ORDER — POTASSIUM CHLORIDE 20 MEQ PO PACK
40.0000 meq | PACK | Freq: Once | ORAL | Status: AC
  Administered 2022-10-25: 40 meq
  Filled 2022-10-25: qty 2

## 2022-10-25 MED ORDER — ACETAMINOPHEN 650 MG RE SUPP: 650.0000 mg | Freq: Four times a day (QID) | RECTAL | Status: DC

## 2022-10-25 MED ORDER — FUROSEMIDE 10 MG/ML IJ SOLN
20.0000 mg | Freq: Once | INTRAMUSCULAR | Status: AC
  Administered 2022-10-25: 20 mg via INTRAVENOUS
  Filled 2022-10-25: qty 2

## 2022-10-25 MED ORDER — ACETAMINOPHEN 325 MG PO TABS
650.0000 mg | ORAL_TABLET | ORAL | Status: DC | PRN
  Administered 2022-10-25: 650 mg via ORAL
  Filled 2022-10-25: qty 2

## 2022-10-25 MED ORDER — ACETAMINOPHEN 650 MG RE SUPP
650.0000 mg | Freq: Four times a day (QID) | RECTAL | Status: DC | PRN
  Administered 2022-10-26 (×2): 650 mg via RECTAL
  Filled 2022-10-25 (×2): qty 1

## 2022-10-25 MED ORDER — MAGNESIUM SULFATE 4 GM/100ML IV SOLN
4.0000 g | Freq: Once | INTRAVENOUS | Status: AC
  Administered 2022-10-25: 4 g via INTRAVENOUS
  Filled 2022-10-25 (×2): qty 100

## 2022-10-25 MED ORDER — ORAL CARE MOUTH RINSE: 15.0000 mL | OROMUCOSAL | Status: DC | PRN

## 2022-10-25 NOTE — Progress Notes (Signed)
NAME:  [name], MRN:  [id number], DOB:  [birth year], LOS: 2 ADMISSION DATE:  10/23/2022, CONSULTATION DATE:  1/30 REFERRING MD:  Dr. Beather Arbour EDP , CHIEF COMPLAINT:  Seizure   History of Present Illness:  52 year old male with PMH as below, which is significant for DM, MS on teriflunomide, and HTN who presented to [facility] ED 1/29 with complaint of seizure. His sister found him to be seizing shortly after eating dinner. EMS estimated 10 minutes of total seizure, described as right sided tonic-clonic movements. Resolved with 5 mg versed. The patient remained unresponsive upon arrival to the emergency department and was emergently intubated for airway protections. CT of the head was unremarkable. Laboratory evaluation significant for lactic acid > 9, magnesium 1.6, bicarb 19, glucose 580. He was initiated on treatment for DKA. Neurology was consulted and recommended LP and meningitis coverage as well as transfer to [facility] for continuous EEG. AEDs initiated. Of note, the patient developed hives and hypotension after fentanyl was given, improved with benadryl.  PCCM asked to admit.   Pertinent  Medical History   has a past medical history of Anxiety, Diabetes mellitus without complication ([place]), Hypertension, and MS (multiple sclerosis) ([place]).   Significant Hospital Events: Including procedures, antibiotic start and stop dates in addition to other pertinent events   1/30 admit. LP, MRI. No acute intracranial process. Seen by neuro, possible viral meningitis on CSF, sending out VZV igG 1/31 de-escalate abx . Weaning sedation   2/1 wean to extubate   Interim History / Subjective:   This morning tried to self extubate  Rcvd 2 Versed for this   Dex off   Objective   Blood pressure (!) 142/79, pulse 71, temperature 99.1 F (37.3 C), resp. rate 16, weight 114.9 kg, SpO2 98 %.    Vent Mode: PRVC FiO2 (%):  [30 %] 30 % Set Rate:  [16 bmp] 16 bmp Vt Set:  [600 mL] 600 mL PEEP:  [5 cmH20] 5  cmH20 Plateau Pressure:  [15 cmH20-16 cmH20] 16 cmH20   Intake/Output Summary (Last 24 hours) at 10/25/2022 0803 Last data filed at 10/25/2022 0700 Gross per 24 hour  Intake 2826.86 ml  Output 505 ml  Net 2321.86 ml   Filed Weights   10/24/22 0401 10/25/22 0500  Weight: 113.6 kg 114.9 kg    Examination:  General: chronically and critically ill appearing middle aged M intubated sedated NAD  Neuro: Lethargic with recent sedation. Follows commands  HENT: NCAT ETT secure. EEG leeds in place  Lungs: Ctab symmetrical chest expansion, even unlabored on PSV  Cardiovascular: rr s1s2 no rgm  GI: soft ndnt  GU: foley  Extremities:  L foot transmetatarsal amputation  Skin: L foot ulcerative foot wound    Resolved Hospital Problem list   Sedation related hypotension   Assessment & Plan:   Acute encephalopathy -- improving  Meningitis ruled out  Seizure rrMS Bilateral occipital strokes Spinal stenosis C4-C6 -BID keppra  - holding teriflunomide -RASS goal 0 to -1, dex off  -VZV IgG CSF send out pending  -off meningitis abx antivirals  -PRN versed for sz or agitation   Acute respiratory failure with hypoxia  -wean MV -likely extubation 2/1 -VAP, pulm hygiene --> mobility, IS pulm hygiene when extubated   AKI -renal indices improved but is oliguric P -will give low dose lasix and see if responds. Can incr accordingly  -keep foley for now, daily eval for dc    DM with hyperglycemia Diabetic foot  ulcer  -incr levemir to 32 units + SSI  -wound care   Hypomagnesemia  Borderline Hypokalemia  -replace    Best Practice (right click and "Reselect all SmartList Selections" daily)   Diet/type: NPO -- EN off in anticipation of extubation  DVT prophylaxis: prophylactic heparin  GI prophylaxis: H2B Lines: N/A Foley:  Yes, and it is still needed Code Status:  full code Last date of multidisciplinary goals of care discussion [ ]   Labs   CBC: Recent Labs  Lab 10/23/22 0017  10/23/22 0311 10/23/22 0605 10/23/22 0847 10/24/22 0454  WBC 8.1 9.9  --  10.7* 10.0  NEUTROABS 4.3 9.1*  --   --   --   HGB 16.7 14.3 13.6 14.3 13.1  HCT 50.4 41.1 40.0 41.0 37.9*  MCV 89.4 86.7  --  87.6 87.3  PLT 323 178  --  238 767    Basic Metabolic Panel: Recent Labs  Lab 10/23/22 0355 10/23/22 0605 10/23/22 1624 10/23/22 1827 10/23/22 2316 10/24/22 0454 10/24/22 1647 10/25/22 0423  NA 131*   < > 134* 136 136 137  --  135  K 3.9   < > 3.4* 3.7 3.5 3.3*  --  3.5  CL 99   < > 100 103 105 106  --  104  CO2 24   < > 23 21* 21* 21*  --  24  GLUCOSE 423*   < > 199* 209* 199* 184*  --  265*  BUN 17   < > 15 14 12 10   --  15  CREATININE 1.15   < > 1.04 0.98 1.07 1.09  --  1.00  CALCIUM 8.6*   < > 9.3 9.1 9.1 8.6*  --  8.2*  MG 1.6*  --   --   --   --  1.6* 1.8 1.6*  PHOS  --   --   --   --   --  2.0* 3.8 3.1   < > = values in this interval not displayed.   GFR: Estimated Creatinine Clearance: 111.3 mL/min (by C-G formula based on SCr of 1 mg/dL). Recent Labs  Lab 10/23/22 0010 10/23/22 0017 10/23/22 0103 10/23/22 0208 10/23/22 0311 10/23/22 0847 10/24/22 0454  PROCALCITON  --   --  <0.10  --   --   --   --   WBC  --  8.1  --   --  9.9 10.7* 10.0  LATICACIDVEN >9.0*  --   --  >9.0*  --  3.0*  --     Liver Function Tests: Recent Labs  Lab 10/23/22 0010  AST 36  ALT 15  ALKPHOS 121  BILITOT 0.7  PROT 8.5*  ALBUMIN 3.8   No results for input(s): "LIPASE", "AMYLASE" in the last 168 hours. No results for input(s): "AMMONIA" in the last 168 hours.  ABG    Component Value Date/Time   PHART 7.382 10/23/2022 0605   PCO2ART 34.5 10/23/2022 0605   PO2ART 187 (H) 10/23/2022 0605   HCO3 20.5 10/23/2022 0605   TCO2 22 10/23/2022 0605   ACIDBASEDEF 4.0 (H) 10/23/2022 0605   O2SAT 100 10/23/2022 0605     Coagulation Profile: Recent Labs  Lab 10/23/22 0010  INR 1.2    Cardiac Enzymes: Recent Labs  Lab 10/23/22 0010  CKTOTAL 72    HbA1C: Hgb  A1c MFr Bld  Date/Time Value Ref Range Status  10/23/2022 08:47 AM 11.5 (H) 4.8 - 5.6 % Final    Comment:    (  NOTE) Pre diabetes:          5.7%-6.4%  Diabetes:              >6.4%  Glycemic control for   <7.0% adults with diabetes   10/02/2017 05:20 AM 9.1 (H) 4.8 - 5.6 % Final    Comment:    (NOTE) Pre diabetes:          5.7%-6.4% Diabetes:              >6.4% Glycemic control for   <7.0% adults with diabetes     CBG: Recent Labs  Lab 10/24/22 1603 10/24/22 1914 10/24/22 2313 10/25/22 0313 10/25/22 0800  GLUCAP 203* 163* 226* 251* 264*    CRITICAL CARE Performed by: Cristal Generous   Total critical care time: 37 minutes  Critical care time was exclusive of separately billable procedures and treating other patients. Critical care was necessary to treat or prevent imminent or life-threatening deterioration.  Critical care was time spent personally by me on the following activities: development of treatment plan with patient and/or surrogate as well as nursing, discussions with consultants, evaluation of patient's response to treatment, examination of patient, obtaining history from patient or surrogate, ordering and performing treatments and interventions, ordering and review of laboratory studies, ordering and review of radiographic studies, pulse oximetry and re-evaluation of patient's condition.  Eliseo Gum MSN, AGACNP-BC [place] for pager  10/25/2022, 8:03 AM

## 2022-10-25 NOTE — Progress Notes (Signed)
Called Gretchen with [facility] was notified about critical lab of [place] 9.2.

## 2022-10-25 NOTE — Progress Notes (Signed)
Nutrition Follow-up  DOCUMENTATION CODES:   Not applicable  INTERVENTION:   Encourage PO intake  -1 packet Juven BID, each packet provides 95 calories, 2.5 grams of protein (collagen), and 9.8 grams of carbohydrate (3 grams sugar); also contains 7 grams of L-arginine and L-glutamine, 300 mg vitamin C, 15 mg vitamin E, 1.2 mcg vitamin B-12, 9.5 mg zinc, 200 mg calcium, and 1.5 g  Calcium Beta-hydroxy-Beta-methylbutyrate to support wound healing   NUTRITION DIAGNOSIS:   Increased nutrient needs related to wound healing as evidenced by estimated needs. Ongoing.   GOAL:   Patient will meet greater than or equal to 90% of their needs Progressing  MONITOR:   TF tolerance, Vent status  REASON FOR ASSESSMENT:   Consult Enteral/tube feeding initiation and management  ASSESSMENT:   Pt with PMH of DM with partial L foot amputation, HTN, MS admitted with seizures.   Pt discussed during ICU rounds and with RN.  Spoke with RN. Per RN pt has passed his swallow eval and diet will advance.  Pt seems unable to see well during visit.  No family present. Per pt he has not been taking his DM medications and now knows better and plans on taking them.   Pt reports good appetite but has lost a lot of weight PTA. Per RN ex-wife confirms this.   Pt extubated, sitting in a chair  Medications reviewed and include: SSI, 32 units levemir daily LR @ 50 ml/hr Mag sulfate x 1  Labs reviewed: Magnesium 1.6 A1C: 11.5 CBG's: 117-251   Diet Order:   Diet Order             Diet NPO time specified  Diet effective now                   EDUCATION NEEDS:   Not appropriate for education at this time  Skin:  Skin Assessment: Skin Integrity Issues: Skin Integrity Issues:: Stage I, Diabetic Ulcer Stage I: B buttocks Diabetic Ulcer: L foot  Last BM:  unknown  Height:   Ht Readings from Last 1 Encounters:  10/23/22 5\' 11"  (1.803 m)    Weight:   Wt Readings from Last 1 Encounters:   10/25/22 114.9 kg    BMI:  Body mass index is 35.33 kg/m.  Estimated Nutritional Needs:   Kcal:  2100-2400  Protein:  100-115 grams  Fluid:  > 2 L/day  Lockie Pares., RD, LDN, CNSC See AMiON for contact information

## 2022-10-25 NOTE — Procedures (Addendum)
Patient Name: [name]  MRN: [id number]  Epilepsy Attending: Lora Havens  Referring Physician/Provider: Lorenza Chick, MD  Duration: 10/24/2022 [phone] to 10/25/2022 1006   Patient history: 52 year old male presented with seizures.  EEG to evaluate for seizure.   Level of alertness: lethargic   AEDs during EEG study: Keppra   Technical aspects: This EEG study was done with scalp electrodes positioned according to the 10-20 International system of electrode placement. Electrical activity was reviewed with band pass filter of 1-70Hz , sensitivity of 7 uV/mm, display speed of 55mm/sec with a 60Hz  notched filter applied as appropriate. EEG data were recorded continuously and digitally stored.  Video monitoring was available and reviewed as appropriate.   Description: EEG showed posterior dominant rhythm of 8 Hz activity of moderate voltage (25-35 uV) seen predominantly in posterior head regions, symmetric and reactive to eye opening and eye closing.  Sleep was characterized by vertex waves, sleep spindles (12 to 40 Hz), maximal frontocentral region.  EEG also showed intermittent generalized 3 to 6 Hz theta-delta slowing. Hyperventilation and photic stimulation were not performed.      ABNORMALITY -Intermittent slow, generalized   IMPRESSION: This study was suggestive of mild to moderate diffuse encephalopathy, nonspecific to etiology.  No seizures or epileptiform discharges were seen during the study.    [name] Barbra Sarks

## 2022-10-25 NOTE — Progress Notes (Signed)
eLink Physician-Brief Progress Note Patient Name: [name] DOB: [birth year] MRN: [id number]   Date of Service  10/25/2022  HPI/Events of Note  Patient with tachypnea, intermittent cough and brief desaturation, he is awake and following commands, and has a vigorous cough to command.  eICU Interventions  NPO. Trial BIPAP, low threshold for consideration of re-intubation if he appears to struggle on BIPAP.        [name] [name] 10/25/2022, 10:05 PM

## 2022-10-25 NOTE — Progress Notes (Signed)
EEG D/C'd. Patient had no skin break down. No skin breakdown noted.

## 2022-10-25 NOTE — Progress Notes (Signed)
Pt placed on PS/CPAP 5/5 on 30% and is tolerating well. RT will monitor.

## 2022-10-25 NOTE — Progress Notes (Signed)
vLTM maintenance  All impedances below 10k.  No skin breakdown noted at all skin sites

## 2022-10-25 NOTE — Progress Notes (Addendum)
LTM maint complete - no skin breakdown , Study went down@ during software update Study restarted. [facility] monitored, Event button test confirmed by [facility].

## 2022-10-25 NOTE — Procedures (Signed)
Extubation Procedure Note  Patient Details:   Name: [name] DOB: July 12, 1971 MRN: [id number]   Airway Documentation:  Airway 7.5 mm (Active)  Secured at (cm) 23 cm 10/25/22 0740  Measured From Lips 10/25/22 [place] 10/25/22 0740  Secured By [employer] 10/25/22 0740  Tube Holder Repositioned Yes 10/25/22 0740  Prone position No 10/25/22 0740  Cuff Pressure (cm H2O) Green OR 18-26 CmH2O 10/25/22 0740  Site Condition Dry 10/25/22 0740   Vent end date: 10/25/22 Vent end time: 0821   Evaluation  O2 sats: stable throughout Complications: No apparent complications Patient did tolerate procedure well. Bilateral Breath Sounds: Clear, Diminished   Yes  Pt extubated to [place] with RN at bedside and is tolerating well. Positive cuff leak noted.   Cathie Olden 10/25/2022, 8:21 AM

## 2022-10-25 NOTE — Evaluation (Signed)
Physical Therapy Evaluation Patient Details Name: [name] MRN: [id number] DOB: 1971/01/05 Today's Date: 10/25/2022  History of Present Illness  Patient is a 52 y/o male with PMH DM, MS, HTN and prior L foot TMA, h/o bilat occipital strokes.  Presented with seizure and found to be in DKA with CBG 580.  Was intubated 1/31-2/01/24.  Clinical Impression  Patient presents with decreased mobility due to deficits listed in PT problem list.  Currently patient needing mod A for transfers, and short distance ambulation to bathroom.  Demonstrates instability with poor deficit/safety awareness and high risk for falls.  Patient previously home and independent.  Stated he lives alone, but does have a wife.  States could have assistance at d/c.  May benefit from acute inpatient rehab prior to d/c home with assist.  PT will continue to follow.        Recommendations for follow up therapy are one component of a multi-disciplinary discharge planning process, led by the attending physician.  Recommendations may be updated based on patient status, additional functional criteria and insurance authorization.  Follow Up Recommendations Acute inpatient rehab (3hours/day)      Assistance Recommended at Discharge Intermittent Supervision/Assistance  Patient can return home with the following  A lot of help with walking and/or transfers;A lot of help with bathing/dressing/bathroom;Assistance with cooking/housework;Direct supervision/assist for medications management;Assist for transportation;Help with stairs or ramp for entrance    Equipment Recommendations Rolling walker (2 wheels)  Recommendations for Other Services  Rehab consult    Functional Status Assessment Patient has had a recent decline in their functional status and demonstrates the ability to make significant improvements in function in a reasonable and predictable amount of time.     Precautions / Restrictions Precautions Precautions:  Fall Restrictions Weight Bearing Restrictions: No      Mobility  Bed Mobility Overal bed mobility: Needs Assistance Bed Mobility: Supine to Sit     Supine to sit: Min assist, HOB elevated     General bed mobility comments: struggled to get up to EOB and initial imbalance noted min A for balance, feet on floor    Transfers Overall transfer level: Needs assistance Equipment used: Rolling walker (2 wheels) Transfers: Sit to/from Stand, Bed to chair/wheelchair/BSC Sit to Stand: From elevated surface, Mod assist   Step pivot transfers: Mod assist       General transfer comment: cues for hand placement, initially trying up from low bed pulling on walker and only edging closer to EOB; two attempts to step to recliner as pt moving forward and mod A with  multimodal cues to step backwards to chair with increased time    Ambulation/Gait Ambulation/Gait assistance: Mod assist Gait Distance (Feet): 12 Feet Assistive device: Rolling walker (2 wheels) Gait Pattern/deviations: Step-through pattern, Wide base of support, Trunk flexed, Ataxic       General Gait Details: walked to bathroom with RW and mod A for safety with walker management, for balance and for lines  Stairs            Wheelchair Mobility    Modified Rankin (Stroke Patients Only)       Balance Overall balance assessment: Needs assistance Sitting-balance support: Feet supported Sitting balance-Leahy Scale: Poor Sitting balance - Comments: imbalance on EOB minguard provided for safety   Standing balance support: Bilateral upper extremity supported Standing balance-Leahy Scale: Poor                 High Level Balance Comments: performing dynamic balance activity  perineal hygiene with increased time and mod A due to decreased awareness and postural instability             Pertinent Vitals/Pain Pain Assessment Pain Assessment: No/denies pain    Home Living Family/patient expects to be  discharged to:: Private residence Living Arrangements: Children;Spouse/significant other Available Help at Discharge: Family;Other (Comment) (unsure) Type of Home: Apartment Home Access: Stairs to enter   Entrance Stairs-Number of Steps: 2 Alternate Level Stairs-Number of Steps: flight Home Layout: Two level Home Equipment: None      Prior Function Prior Level of Function : Independent/Modified Independent               ADLs Comments: Pt does not drive due to photophobia, daughter gets groceries     Hand Dominance   Dominant Hand:  (B)    Extremity/Trunk Assessment   Upper Extremity Assessment Upper Extremity Assessment: Defer to OT evaluation RUE Deficits / Details: edema R hand; able to use funcitonally with minimal dissiculty RUE Coordination: decreased fine motor LUE Deficits / Details: generalized weakness    Lower Extremity Assessment Lower Extremity Assessment: LLE deficits/detail;RLE deficits/detail RLE Deficits / Details: AROM and strength WFL, decreased sensation to light touch on feet RLE Sensation: decreased light touch LLE Deficits / Details: transmet amputation with dressing on foot, strength, AROM WFL LLE Sensation: decreased light touch    Cervical / Trunk Assessment Cervical / Trunk Assessment: Normal  Communication   Communication: Expressive difficulties (dysarthric)  Cognition Arousal/Alertness: Awake/alert Behavior During Therapy: Impulsive Overall Cognitive Status: Impaired/Different from baseline Area of Impairment: Orientation, Attention, Memory, Following commands, Safety/judgement, Awareness, Problem solving                 Orientation Level: Disoriented to, Time, Place (initially stated in [place] in hospital) Current Attention Level: Sustained Memory: Decreased short-term memory Following Commands: Follows one step commands with increased time Safety/Judgement: Decreased awareness of safety, Decreased awareness of  deficits Awareness: Intellectual, Emergent Problem Solving: Slow processing, Requires verbal cues          General Comments General comments (skin integrity, edema, etc.): 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 VSS on RA with ambulation to bathroom and back to recliner; handoff to OT end of session    Exercises     Assessment/Plan    PT Assessment Patient needs continued PT services  PT Problem List Decreased strength;Decreased balance;Decreased cognition;Decreased mobility;Decreased activity tolerance;Decreased safety awareness;Decreased knowledge of use of DME;Decreased knowledge of precautions       PT Treatment Interventions DME instruction;Functional mobility training;Balance training;Patient/family education;Gait training;Therapeutic activities;Therapeutic exercise;Cognitive remediation;Stair training;Neuromuscular re-education    PT Goals (Current goals can be found in the Care Plan section)  Acute Rehab PT Goals Patient Stated Goal: return to independent PT Goal Formulation: With patient Time For Goal Achievement: 11/08/22 Potential to Achieve Goals: Good    Frequency Min 3X/week     Co-evaluation               AM-PAC PT "6 Clicks" Mobility  Outcome Measure Help needed turning from your back to your side while in a flat bed without using bedrails?: A Little Help needed moving from lying on your back to sitting on the side of a flat bed without using bedrails?: A Little Help needed moving to and from a bed to a chair (including a wheelchair)?: A Lot Help needed standing up from a chair using your arms (e.g., wheelchair or bedside chair)?: A Lot Help needed to walk  in hospital room?: A Lot Help needed climbing 3-5 steps with a railing? : Total 6 Click Score: 13    End of Session Equipment Utilized During Treatment: Gait belt Activity Tolerance: Patient tolerated treatment well Patient left: Other (comment) (in bathroom with OT) Nurse  Communication: Mobility status PT Visit Diagnosis: Other abnormalities of gait and mobility (R26.89);Other symptoms and signs involving the nervous system (R29.898);Muscle weakness (generalized) (M62.81)    Time: 1020-1050 PT Time Calculation (min) (ACUTE ONLY): 30 min   Charges:   PT Evaluation $PT Eval Moderate Complexity: 1 Mod PT Treatments $Gait Training: 8-22 mins        Magda Kiel, PT Acute Rehabilitation Services Office:[phone] 10/25/2022   Reginia Naas 10/25/2022, 1:00 PM

## 2022-10-25 NOTE — Progress Notes (Signed)
Inpatient Diabetes Program Recommendations  AACE/ADA: New Consensus Statement on Inpatient Glycemic Control (2015)  Target Ranges:  Prepandial:   less than 140 mg/dL      Peak postprandial:   less than 180 mg/dL (1-2 hours)      Critically ill patients:  140 - 180 mg/dL   Lab Results  Component Value Date   GLUCAP 264 (H) 10/25/2022   HGBA1C 11.5 (H) 10/23/2022    Review of Glycemic Control  Latest Reference Range & Units 10/24/22 16:03 10/24/22 19:14 10/24/22 23:13 10/25/22 03:13 10/25/22 08:00  Glucose-Capillary 70 - 99 mg/dL 203 (H) 163 (H) 226 (H) 251 (H) 264 (H)  Diabetes history: DM Outpatient Diabetes medications:  Novolog 55 units tid with meals Levemir 40 units daily Current orders for Inpatient glycemic control:  Semglee 32 units QD, Novolog 0-15 units Q4H  Inpatient Diabetes Program Recommendations:   Note patient extubated this AM.  Agree with current orders.  Will f/u and speak to patient on 10/26/22 if appropriate.    Thanks,  Adah Perl, RN, BC-ADM Inpatient Diabetes Coordinator Pager [phone]  (8a-5p)

## 2022-10-25 NOTE — Evaluation (Signed)
Occupational Therapy Evaluation Patient Details Name: [name] MRN: [id number] DOB: [birth year] Today's Date: 10/25/2022   History of Present Illness Patient is a 52 y/o male with PMH DM, MS, HTN and prior L foot TMA.  Presented with seizure and found to be in DKA with CBG 580.  Was intubated 1/31-2/01/24.   Clinical Impression   PTA pt living alone independently in an apt and states his daughter assists with IADL tasks; does not drive. Spoke to wife over the phone who states they are separated; however she is his POA and assists with finances.  Pt currently requires mod A with mobility and Mod A with ADL @ RW level due to deficits listed below. At this time recommend AIR to facilitate DC home @ mod I level with intermittent S. VSS on RA.     Recommendations for follow up therapy are one component of a multi-disciplinary discharge planning process, led by the attending physician.  Recommendations may be updated based on patient status, additional functional criteria and insurance authorization.   Follow Up Recommendations  Acute inpatient rehab (3hours/day)     Assistance Recommended at Discharge Intermittent Supervision/Assistance  Patient can return home with the following A little help with walking and/or transfers;Two people to help with walking and/or transfers;Assistance with cooking/housework;Direct supervision/assist for medications management;Direct supervision/assist for financial management;Assist for transportation;Help with stairs or ramp for entrance    Functional Status Assessment  Patient has had a recent decline in their functional status and demonstrates the ability to make significant improvements in function in a reasonable and predictable amount of time.  Equipment Recommendations  BSC/3in1;Other (comment) (RW)    Recommendations for Other Services Rehab consult     Precautions / Restrictions Precautions Precautions: Fall Restrictions Weight Bearing  Restrictions: No      Mobility Bed Mobility Overal bed mobility: Needs Assistance      General bed mobility comments: OOB    Transfers Overall transfer level: Needs assistance Equipment used: Rolling walker (2 wheels) Transfers: Sit to/from Stand,  Sit to Stand: From elevated surface, Mod assist - improved as session progressed     Step pivot transfers: Mod assist     General transfer comment: cues for hand placement; apparent confusion noted     Balance Overall balance assessment: History of Falls, Needs assistance   Sitting balance-Leahy Scale: Fair       Standing balance-Leahy Scale: Poor                             ADL either performed or assessed with clinical judgement   ADL Overall ADL's : Needs assistance/impaired Eating/Feeding: NPO   Grooming: Minimal assistance   Upper Body Bathing: Minimal assistance   Lower Body Bathing: Moderate assistance;Sit to/from stand   Upper Body Dressing : Minimal assistance   Lower Body Dressing: Maximal assistance   Toilet Transfer: Minimal assistance;Ambulation;+2 for safety/equipment;Rolling walker (2 wheels);BSC/3in1 (3in1 over toilet)   Toileting- Clothing Manipulation and Hygiene: Moderate assistance;Sit to/from stand       Functional mobility during ADLs: Mod assistance;Cueing for safety;+2 for safety/equipment;Rolling walker (2 wheels)       Vision Baseline Vision/History: 1 Wears glasses Patient Visual Report: Blurring of vision Vision Assessment?: Yes Eye Alignment: Within Functional Limits Ocular Range of Motion: Within Functional Limits Alignment/Gaze Preference: Within Defined Limits Tracking/Visual Pursuits: Decreased smoothness of vertical tracking;Decreased smoothness of horizontal tracking Saccades: Decreased speed of saccadic movement Additional Comments: pt wears glasses; does  not have them at this time; unable to read up close or distant; states he would be able to see clock on  wall if he had his glasses; nsg states wife told her he has poor vision at baseline due to diabetes; will further assess; under care of eye doctor     Perception Perception Comments: most likely intact   Praxis      Pertinent Vitals/Pain Pain Assessment Pain Assessment: No/denies pain     Hand Dominance  (B)   Extremity/Trunk Assessment Upper Extremity Assessment Upper Extremity Assessment: Defer to OT evaluation RUE Deficits / Details: edema R hand; able to use funcitonally with minimal dissiculty RUE Coordination: decreased fine motor LUE Deficits / Details: generalized weakness   Lower Extremity Assessment Lower Extremity Assessment: Defer to PT evaluation RLE Deficits / Details: AROM and strength WFL, decreased sensation to light touch on feet RLE Sensation: decreased light touch LLE Deficits / Details: transmet amputation with dressing on foot, strength, AROM WFL LLE Sensation: decreased light touch   Cervical / Trunk Assessment Cervical / Trunk Assessment: Normal   Communication Communication Communication: Expressive difficulties (dysarthric)   Cognition Arousal/Alertness: Awake/alert Behavior During Therapy: Impulsive Overall Cognitive Status: Impaired/Different from baseline Area of Impairment: Orientation, Attention, Memory, Following commands, Safety/judgement, Awareness, Problem solving                 Orientation Level: Disoriented to, Time, Place (initially stated in [place] in hospital) Current Attention Level: Sustained Memory: Decreased short-term memory Following Commands: Follows one step commands with increased time Safety/Judgement: Decreased awareness of safety, Decreased awareness of deficits Awareness: Intellectual, Emergent Problem Solving: Slow processing, Requires verbal cues General Comments: pt labile during evaluation when talking about reason for not taking medications  Unaware of lines when attempting to complete pericare    General Comments  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    Exercises     Shoulder Instructions      Home Living Family/patient expects to be discharged to:: Private residence Living Arrangements: Children;Non-relatives/Friends Available Help at Discharge: Family;Other (Comment) (unsure) Type of Home: Apartment Home Access: Stairs to enter Entrance Stairs-Number of Steps: 2   Home Layout: Two level Alternate Level Stairs-Number of Steps: flight   Bathroom Shower/Tub: Therapist, art:  (unsure)   Home Equipment: None          Prior Functioning/Environment Prior Level of Function : Independent/Modified Independent               ADLs Comments: Pt does not drive due to photophobia, daughter gets groceries (per wife he has had multiple falls); wife/daughter assist with finances; hx of poor medication compliance per wife        OT Problem List: Decreased strength;Decreased range of motion;Decreased activity tolerance;Impaired balance (sitting and/or standing);Impaired vision/perception;Decreased coordination;Decreased cognition;Decreased safety awareness;Decreased knowledge of use of DME or AE;Impaired sensation;Obesity;Impaired UE functional use;Increased edema      OT Treatment/Interventions: Self-care/ADL training;Therapeutic exercise;DME and/or AE instruction;Therapeutic activities;Cognitive remediation/compensation;Visual/perceptual remediation/compensation;Patient/family education;Balance training    OT Goals(Current goals can be found in the care plan section) Acute Rehab OT Goals Patient Stated Goal: to get better OT Goal Formulation: With patient Time For Goal Achievement: 11/08/22 Potential to Achieve Goals: Good  OT Frequency: Min 2X/week    Co-evaluation              AM-PAC OT "6 Clicks" Daily Activity     Outcome Measure Help from another person eating meals?:  Total  (NPO) Help from another person taking care of personal grooming?: A Little Help from another person toileting, which includes using toliet, bedpan, or urinal?: A Lot Help from another person bathing (including washing, rinsing, drying)?: A Lot Help from another person to put on and taking off regular upper body clothing?: A Little Help from another person to put on and taking off regular lower body clothing?: A Lot 6 Click Score: 13   End of Session Equipment Utilized During Treatment: Gait belt;Rolling walker (2 wheels) Nurse Communication: Mobility status  Activity Tolerance: Patient tolerated treatment well Patient left: in chair;with call bell/phone within reach;with chair alarm set  OT Visit Diagnosis: Unsteadiness on feet (R26.81);Other abnormalities of gait and mobility (R26.89);Muscle weakness (generalized) (M62.81);History of falling (Z91.81);Other symptoms and signs involving cognitive function;Low vision, both eyes (H54.2)                Time: 1700-1749 OT Time Calculation (min): 20 min Charges:  OT General Charges $OT Visit: 1 Visit OT Evaluation $OT Eval Moderate Complexity: [place], OT/L   Acute OT Clinical Specialist Acute Rehabilitation Services Pager [phone] Office [phone]   [facility] 10/25/2022, 1:03 PM

## 2022-10-25 NOTE — Progress Notes (Signed)
Subjective: [name]. Extubated today. Is tearful. States he didn't take his insulin even though he had "plenty"  ROS: negative except above  Examination  Vital signs in last 24 hours: Temp:  [98.4 F (36.9 C)-99.9 F (37.7 C)] 99.1 F (37.3 C) (02/01 0700) Pulse Rate:  [66-79] 71 (02/01 0700) Resp:  [14-18] 16 (02/01 0700) BP: (124-161)/(70-94) 142/79 (02/01 0700) SpO2:  [92 %-100 %] 92 % (02/01 0821) FiO2 (%):  [30 %] 30 % (02/01 0740) Weight:  [114.9 kg] 114.9 kg (02/01 0500)  General: sitting in chair, tearful but NAD Neuro: Aox3, no aphasia, PERLA, able to count fingers , rest of CN grossly intact, 5/5 in all extremities   Basic Metabolic Panel: Recent Labs  Lab 10/23/22 0355 10/23/22 0605 10/23/22 1624 10/23/22 1827 10/23/22 2316 10/24/22 0454 10/24/22 1647 10/25/22 0423  NA 131*   < > 134* 136 136 137  --  135  K 3.9   < > 3.4* 3.7 3.5 3.3*  --  3.5  CL 99   < > 100 103 105 106  --  104  CO2 24   < > 23 21* 21* 21*  --  24  GLUCOSE 423*   < > 199* 209* 199* 184*  --  265*  BUN 17   < > 15 14 12 10   --  15  CREATININE 1.15   < > 1.04 0.98 1.07 1.09  --  1.00  CALCIUM 8.6*   < > 9.3 9.1 9.1 8.6*  --  8.2*  MG 1.6*  --   --   --   --  1.6* 1.8 1.6*  PHOS  --   --   --   --   --  2.0* 3.8 3.1   < > = values in this interval not displayed.    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   Convulsive status epilepticus, resolved New onset seizure -Initially there was suspicion for meningitis because of fever.  Lumbar puncture showed lymphocytic pleocytosis (10) with glucose 214 and protein 92.  Meningitis encephalitis panel is negative.   -Patient is at high risk of seizures due to occipital strokes and may have had lowered seizure threshold in the setting of hyperglycemia   Recommendations -Continue Keppra 500 mg twice daily. Patient was tearful. Sometimes this can be a side effect of keppra. If patient continues to have crying spells, can consider switching to vimpat 50mg  BID - DC LTM eeg  - Discussed importance of medication compliance and seizure provoking factors including hypo and hyperglycemia -Continue seizure precautions, patient does not drive  -As needed IV Versed for clinical seizure-like activity -Management of rest of comorbidities per primary team - F/uu with Dr Felecia Shelling at [facility]  Seizure precautions: Per Mechele Dawley  [place] DMV statutes, patients with seizures are not allowed to drive until they have been seizure-free for six months and cleared by a physician    Use caution when using heavy equipment or power tools. Avoid working on ladders or at heights. Take showers instead of baths. Ensure the water temperature is not too high on the home water heater. Do not go swimming alone. Do not lock yourself in a room alone (i.e. bathroom). When caring for infants or small children, sit down when holding, feeding, or changing them to minimize risk of injury to the child in the event you have a seizure. Maintain good sleep hygiene. Avoid alcohol.    If patient has another seizure, call 911 and bring them back to the ED if: A.  The seizure lasts longer than 5 minutes.      B.  The patient doesn't wake shortly after the seizure or has new problems such as difficulty seeing, speaking or moving following the seizure C.  The patient was injured during the seizure D.   The patient has a temperature over 102 F (39C) E.  The patient vomited during the seizure and now is having trouble breathing    During the Seizure   - First, ensure adequate ventilation and place patients on the floor on their left side  Loosen clothing around the neck and ensure the airway is patent. If the patient is clenching the teeth, do not force the mouth open with any object as this can cause severe damage - Remove all items from the surrounding that can be hazardous. The patient may be oblivious to what's happening and may not even know what he or she is doing. If the patient is confused and wandering, either gently guide him/her away and block access to outside areas - Reassure the individual and be comforting - Call 911. In most cases, the seizure ends before EMS arrives. However, there are cases when seizures may last over 3 to 5 minutes. Or the individual may have developed breathing difficulties or severe injuries. If a pregnant patient or a person with diabetes develops a seizure, it is prudent to call an ambulance. - Finally, if the patient does not regain full consciousness, then call EMS. Most patients will remain confused for about 45 to 90 minutes after a seizure, so you must use judgment in calling for help.    After the Seizure (Postictal Stage)   After a seizure, most patients experience confusion, fatigue, muscle pain and/or a headache. Thus, one should permit the individual to sleep. For the next few days, reassurance is essential. Being calm and helping reorient the person is also of importance.   Most seizures are painless and end spontaneously. Seizures are not harmful to others but can lead to complications such as stress on the lungs, brain and the heart. Individuals with prior lung problems may develop labored breathing and respiratory distress.    I have spent a total of  36 minutes with the patient reviewing hospital notes,  test results, labs and examining the  patient as well as establishing an assessment and plan.  > 50% of time was spent in direct patient care.  Zeb Comfort Epilepsy [facility] For questions after 5pm please refer to AMION to reach the Neurologist on call

## 2022-10-25 NOTE — Progress Notes (Signed)
  Inpatient Rehab Admissions Coordinator :  Per therapy recommendations patient was screened for CIR candidacy by Danne Baxter RN MSN. Patient's BCBS is out of network with [facility]. Likely connected to [facility]. I would recommend AIR level with an in network provider. . I will not place a Rehab Consult. Recommend other Rehab Venues to be pursued. Please contact me with any questions.  Danne Baxter RN MSN Admissions Coordinator [phone]

## 2022-10-26 ENCOUNTER — Inpatient Hospital Stay (HOSPITAL_COMMUNITY): Payer: BLUE CROSS/BLUE SHIELD

## 2022-10-26 DIAGNOSIS — E1111 Type 2 diabetes mellitus with ketoacidosis with coma: Secondary | ICD-10-CM | POA: Diagnosis not present

## 2022-10-26 DIAGNOSIS — N179 Acute kidney failure, unspecified: Secondary | ICD-10-CM | POA: Diagnosis not present

## 2022-10-26 DIAGNOSIS — G40901 Epilepsy, unspecified, not intractable, with status epilepticus: Secondary | ICD-10-CM | POA: Diagnosis not present

## 2022-10-26 DIAGNOSIS — J9601 Acute respiratory failure with hypoxia: Secondary | ICD-10-CM | POA: Diagnosis not present

## 2022-10-26 LAB — CBC
HCT: 38.3 % — ABNORMAL LOW (ref 39.0–52.0)
Hemoglobin: 13.2 g/dL (ref 13.0–17.0)
MCH: 30.4 pg (ref 26.0–34.0)
MCHC: 34.5 g/dL (ref 30.0–36.0)
MCV: 88.2 fL (ref 80.0–100.0)
Platelets: 186 10*3/uL (ref 150–400)
RBC: 4.34 MIL/uL (ref 4.22–5.81)
RDW: 12.9 % (ref 11.5–15.5)
WBC: 9.8 10*3/uL (ref 4.0–10.5)
nRBC: 0 % (ref 0.0–0.2)

## 2022-10-26 LAB — BASIC METABOLIC PANEL
Anion gap: 9 (ref 5–15)
BUN: 14 mg/dL (ref 6–20)
CO2: 27 mmol/L (ref 22–32)
Calcium: 8.4 mg/dL — ABNORMAL LOW (ref 8.9–10.3)
Chloride: 103 mmol/L (ref 98–111)
Creatinine, Ser: 1.04 mg/dL (ref 0.61–1.24)
GFR, Estimated: >60 mL/min (ref >60)
Glucose, Bld: 104 mg/dL — ABNORMAL HIGH (ref 70–99)
Potassium: 3.7 mmol/L (ref 3.5–5.1)
Sodium: 139 mmol/L (ref 135–145)

## 2022-10-26 LAB — GLUCOSE, CAPILLARY
Glucose-Capillary: 115 mg/dL — ABNORMAL HIGH (ref 70–99)
Glucose-Capillary: 112 mg/dL — ABNORMAL HIGH (ref 70–99)
Glucose-Capillary: 132 mg/dL — ABNORMAL HIGH (ref 70–99)
Glucose-Capillary: 330 mg/dL — ABNORMAL HIGH (ref 70–99)
Glucose-Capillary: 170 mg/dL — ABNORMAL HIGH (ref 70–99)
Glucose-Capillary: 226 mg/dL — ABNORMAL HIGH (ref 70–99)

## 2022-10-26 LAB — MAGNESIUM: Magnesium: 1.7 mg/dL (ref 1.7–2.4)

## 2022-10-26 LAB — CSF CULTURE W GRAM STAIN: Culture: NO GROWTH

## 2022-10-26 MED ORDER — FUROSEMIDE 10 MG/ML IJ SOLN
40.0000 mg | Freq: Once | INTRAMUSCULAR | Status: AC
  Administered 2022-10-26: 40 mg via INTRAVENOUS
  Filled 2022-10-26: qty 4

## 2022-10-26 MED ORDER — JUVEN PO PACK
1.0000 | PACK | Freq: Two times a day (BID) | ORAL | Status: DC
  Administered 2022-10-26 – 2022-10-31 (×8): 1 via ORAL
  Filled 2022-10-26 (×8): qty 1

## 2022-10-26 MED ORDER — MAGNESIUM SULFATE 2 GM/50ML IV SOLN
2.0000 g | Freq: Once | INTRAVENOUS | Status: AC
  Administered 2022-10-26: 2 g via INTRAVENOUS
  Filled 2022-10-26: qty 50

## 2022-10-26 MED ORDER — POTASSIUM CHLORIDE 10 MEQ/100ML IV SOLN
10.0000 meq | INTRAVENOUS | Status: DC
  Filled 2022-10-26: qty 100

## 2022-10-26 MED ORDER — SODIUM CHLORIDE 0.9 % IV SOLN
3.0000 g | Freq: Four times a day (QID) | INTRAVENOUS | Status: DC
  Administered 2022-10-26 – 2022-10-29 (×13): 3 g via INTRAVENOUS
  Filled 2022-10-26 (×13): qty 8

## 2022-10-26 MED ORDER — POTASSIUM CHLORIDE CRYS ER 20 MEQ PO TBCR
40.0000 meq | EXTENDED_RELEASE_TABLET | Freq: Once | ORAL | Status: AC
  Administered 2022-10-26: 40 meq via ORAL
  Filled 2022-10-26: qty 2

## 2022-10-26 NOTE — Progress Notes (Signed)
Physical Therapy Treatment Patient Details Name: [name] MRN: [id number] DOB: 1971/07/12 Today's Date: 10/26/2022   History of Present Illness Patient is a 52 y/o male with PMH DM, MS, HTN and prior L foot TMA.  Presented with seizure and found to be in DKA with CBG 580.  Was intubated 1/31-2/01/24.    PT Comments    Patient progressing with ambulation into hallway and some improved balance once increasing distance.  Still with decreased awareness and safety limitations slipping on urine in the floor after condom cath came off and needing help to recognize and rectify situation.  Patient also with some dragging L foot with ambulation and noted during sitting dynamic balance activity leans posterior.  Continue to feel he will benefit from acute inpatient rehab prior to d/c home.   Recommendations for follow up therapy are one component of a multi-disciplinary discharge planning process, led by the attending physician.  Recommendations may be updated based on patient status, additional functional criteria and insurance authorization.  Follow Up Recommendations  Acute inpatient rehab (3hours/day)     Assistance Recommended at Discharge Intermittent Supervision/Assistance  Patient can return home with the following A lot of help with walking and/or transfers;A lot of help with bathing/dressing/bathroom;Assistance with cooking/housework;Direct supervision/assist for medications management;Assist for transportation;Help with stairs or ramp for entrance   Equipment Recommendations  Rolling walker (2 wheels)    Recommendations for Other Services       Precautions / Restrictions Precautions Precautions: Fall     Mobility  Bed Mobility                    Transfers Overall transfer level: Needs assistance Equipment used: Rolling walker (2 wheels) Transfers: Sit to/from Stand Sit to Stand: Min assist, Mod assist   Step pivot transfers: Min assist       General transfer  comment: min A from recliner to stand with cues for hand placement, assist to sit safely on EOB as feet slipping due to floor wet with condom cath removed    Ambulation/Gait Ambulation/Gait assistance: Min assist, Mod assist Gait Distance (Feet): 100 Feet Assistive device: Rolling walker (2 wheels) Gait Pattern/deviations: Step-through pattern, Decreased stride length, Wide base of support, Shuffle       General Gait Details: decreased foot clearance on L with cues to pick up L foot; imbalance in the room around obstacles with increased assist for safety, once in hallway decreased to min A for ambulation   Stairs             Wheelchair Mobility    Modified Rankin (Stroke Patients Only)       Balance Overall balance assessment: Needs assistance   Sitting balance-Leahy Scale: Fair Sitting balance - Comments: EOB static without UE support, when picking up feet to don socks, pt with posterior LOB   Standing balance support: Bilateral upper extremity supported Standing balance-Leahy Scale: Poor Standing balance comment: reliant on UE support                            Cognition Arousal/Alertness: Awake/alert Behavior During Therapy: Impulsive Overall Cognitive Status: Impaired/Different from baseline Area of Impairment: Safety/judgement, Awareness                   Current Attention Level: Selective   Following Commands: Follows one step commands consistently, Follows multi-step commands consistently, Follows multi-step commands with increased time Safety/Judgement: Decreased awareness of safety  Problem Solving: Slow processing General Comments: not aware he had pulled off condom cath when standing, and that sock got wet, feet slipped when sitting on EOB for assist to change socks and clean floor        Exercises      General Comments General comments (skin integrity, edema, etc.): HR to 130 with ambulation, denies symptoms       Pertinent Vitals/Pain Pain Assessment Pain Assessment: No/denies pain    Home Living                          Prior Function            PT Goals (current goals can now be found in the care plan section) Progress towards PT goals: Progressing toward goals    Frequency    Min 3X/week      PT Plan Current plan remains appropriate    Co-evaluation              AM-PAC PT "6 Clicks" Mobility   Outcome Measure  Help needed turning from your back to your side while in a flat bed without using bedrails?: A Little Help needed moving from lying on your back to sitting on the side of a flat bed without using bedrails?: A Little Help needed moving to and from a bed to a chair (including a wheelchair)?: A Little Help needed standing up from a chair using your arms (e.g., wheelchair or bedside chair)?: A Lot Help needed to walk in hospital room?: A Lot Help needed climbing 3-5 steps with a railing? : Total 6 Click Score: 14    End of Session Equipment Utilized During Treatment: Gait belt Activity Tolerance: Patient tolerated treatment well Patient left: in chair;with nursing/sitter in room   PT Visit Diagnosis: Other abnormalities of gait and mobility (R26.89);Other symptoms and signs involving the nervous system (R29.898);Muscle weakness (generalized) (M62.81)     Time: 3149-7026 PT Time Calculation (min) (ACUTE ONLY): 19 min  Charges:  $Gait Training: 8-22 mins                     Magda Kiel, PT Acute Rehabilitation Services Office:[phone] 10/26/2022    Reginia Naas 10/26/2022, 1:37 PM

## 2022-10-26 NOTE — Progress Notes (Signed)
Pharmacy Antibiotic Note  [name] is a 52 y.o. male admitted on 10/23/2022 with pneumonia.  Pharmacy has been consulted for Unasyn dosing.  Plan: Unasyn 3g Q6H.  Follow culture data for de-escalation.  Monitor renal function for dose adjustments as indicated.   Weight: 114.9 kg (253 lb 4.9 oz)  Temp (24hrs), Avg:100.4 F (38 C), Min:98.8 F (37.1 C), Max:102.7 F (39.3 C)  Recent Labs  Lab 10/23/22 0010 10/23/22 0017 10/23/22 0208 10/23/22 0311 10/23/22 0355 10/23/22 0847 10/23/22 1624 10/23/22 2316 10/24/22 0454 10/25/22 0423 10/25/22 1947 10/26/22 0419  WBC  --  8.1  --  9.9  --  10.7*  --   --  10.0  --   --  9.8  CREATININE 1.52*  --   --   --    < > 1.18   < > 1.07 1.09 1.00 0.92 1.04  LATICACIDVEN >9.0*  --  >9.0*  --   --  3.0*  --   --   --   --   --   --    < > = values in this interval not displayed.    Estimated Creatinine Clearance: 107.1 mL/min (by C-G formula based on SCr of 1.04 mg/dL).    Allergies  Allergen Reactions   Shellfish Allergy Nausea And Vomiting   Fentanyl Itching, Swelling and Rash    Thank you for allowing pharmacy to be a part of this patient's care.  Esmeralda Arthur, PharmD, BCCCP  10/26/2022 8:04 AM

## 2022-10-26 NOTE — Inpatient Diabetes Management (Signed)
Inpatient Diabetes Program Recommendations  AACE/ADA: New Consensus Statement on Inpatient Glycemic Control (2015)  Target Ranges:  Prepandial:   less than 140 mg/dL      Peak postprandial:   less than 180 mg/dL (1-2 hours)      Critically ill patients:  140 - 180 mg/dL   Lab Results  Component Value Date   GLUCAP 132 (H) 10/26/2022   HGBA1C 11.5 (H) 10/23/2022    Review of Glycemic Control  Diabetes history: type 2 Outpatient Diabetes medications: Levemir 40 units daily, Novolog 55 units TID Current orders for Inpatient glycemic control: Levemir 32 units daily, Novolog 0-15 units every 4 hours  Inpatient Diabetes Program Recommendations:   Spoke with patient at the bedside. Patient states that he takes Levemir and Novolog at home, but had not been taking for the past few weeks. He stated that he did not have any particular reason why he was not taking it. He states that he has an appointment with a new PCP of 2/11, but probably cannot go due to being in the hospital. Recommended to him that he needed to follow up after discharge.   Patient states that he does not have a home blood glucose meter and does not check his blood sugars at home. Reviewed his HgbA1C of 11.5% which he states that is higher than it has been. States that he has plenty of insulin pens at home.   Will continue to follow blood sugar while in the hospital.  Harvel Ricks RN BSN CDE Diabetes Coordinator Pager: [phone]  8am-5pm

## 2022-10-26 NOTE — Progress Notes (Signed)
NAME:  [name], MRN:  [id number], DOB:  09-10-1971, LOS: 3 ADMISSION DATE:  10/23/2022, CONSULTATION DATE:  1/30 REFERRING MD:  Dr. Beather Arbour EDP , CHIEF COMPLAINT:  Seizure   History of Present Illness:  52 year old male with PMH as below, which is significant for DM, MS on teriflunomide, and HTN who presented to [facility] ED 1/29 with complaint of seizure. His sister found him to be seizing shortly after eating dinner. EMS estimated 10 minutes of total seizure, described as right sided tonic-clonic movements. Resolved with 5 mg versed. The patient remained unresponsive upon arrival to the emergency department and was emergently intubated for airway protections. CT of the head was unremarkable. Laboratory evaluation significant for lactic acid > 9, magnesium 1.6, bicarb 19, glucose 580. He was initiated on treatment for DKA. Neurology was consulted and recommended LP and meningitis coverage as well as transfer to [facility] for continuous EEG. AEDs initiated. Of note, the patient developed hives and hypotension after fentanyl was given, improved with benadryl.  PCCM asked to admit.   Pertinent  Medical History   has a past medical history of Anxiety, Diabetes mellitus without complication ([place]), Hypertension, and MS (multiple sclerosis) ([place]).  Significant Hospital Events: Including procedures, antibiotic start and stop dates in addition to other pertinent events   1/30 admit. LP, MRI. No acute intracranial process. Seen by neuro, possible viral meningitis on CSF, sending out VZV igG 1/31 de-escalate abx. Weaning sedation   2/1 wean to extubate  2/2 T-max 102.7 overnight with continued need for high flow supplemental oxygen   Interim History / Subjective:  Seen lying in bed in with no acute complaints  Reports productive cough   Objective   Blood pressure (!) 142/74, pulse (!) 103, temperature (!) 100.9 F (38.3 C), resp. rate (!) 31, weight 114.9 kg, SpO2 93 %.    Vent Mode: PSV;CPAP FiO2  (%):  [30 %] 30 % PEEP:  [5 cmH20] 5 cmH20 Pressure Support:  [5 cmH20] 5 cmH20   Intake/Output Summary (Last 24 hours) at 10/26/2022 4431 Last data filed at 10/26/2022 0500 Gross per 24 hour  Intake 1592.77 ml  Output 3800 ml  Net -2207.23 ml    Filed Weights   10/24/22 0401 10/25/22 0500  Weight: 113.6 kg 114.9 kg    Examination: General: Acute on chronically ill appearing middle aged male lying in bed in NAD HEENT: [place]/AT, MM pink/moist, PERRL,  Neuro: Alert and oriented x3, slightly confused to situation  CV: s1s2 regular rate and rhythm, no murmur, rubs, or gallops,  PULM:  Bilateral rhonchi with mild tachypnea, no increased work of breathing, on 15L HHNC  GI: soft, bowel sounds active in all 4 quadrants, non-tender, non-distended Extremities: warm/dry, no edema, left foor transmetatarsal amputation Skin: left foot diabetic ulcer   Resolved Hospital Problem list   Sedation related hypotension  Meningitis ruled out  AKI Hypomagnesemia  Borderline Hypokalemia   Assessment & Plan:   Acute encephalopathy - improving  New onset seizure -Likely secondary to hyperglycemia in the setting of occipital strokes (these strokes can lower seizure threshold)  rrMS Bilateral occipital strokes Spinal stenosis C4-C6 P: Primary management per neurology  Maintain neuro protective measures; goal for eurothermia, euglycemia, eunatermia, normoxia, and PCO2 goal of 35-40 Nutrition and bowel regiment  Seizure precautions  AEDs per neurology  Aspirations precautions  Home teriflunomide remains on hold   Acute respiratory failure with hypoxia  Concern for evolving pneumonia  -Patient tolerated extubation 2/1 but  continues to require 15L high flow supplemental oxygen with bilateral rhonchi and productive cough on physical exam. T-max 102.7 overnight  P: Repeat CXR this am  Obtain sputum culture   Start empiric Unasyn  Continue supplemental oxygen for sat goal > 92%, wean as able  Head  of bed elevated 30 degrees. Ensure adequate pulmonary hygiene  Follow cultures  Mobilize as able Aspiration precautions   AKI -renal indices improved, received lasix 2/1 with 3.8L out in the last 24hrs  P: Follow renal function  Monitor urine output Trend Bmet Avoid nephrotoxins Ensure adequate renal perfusion  Remove foley   DM with hyperglycemia Diabetic foot ulcer  P: Local wound care  Continue SSI  and Levemir, may need to increase once taking orals  Hypomagnesemia  Borderline Hypokalemia    Best Practice (right click and "Reselect all SmartList Selections" daily)   Diet/type: NPO -- EN off in anticipation of extubation  DVT prophylaxis: prophylactic heparin  GI prophylaxis: H2B Lines: N/A Foley:  Yes, and it is still needed Code Status:  full code Last date of multidisciplinary goals of care discussion: Continue to update patient daily   Psalms Olarte D. Harris, NP-C [facility] Personal contact information can be found on Amion  If no contact or response made please call [phone] 10/26/2022, 7:25 AM

## 2022-10-26 NOTE — Progress Notes (Signed)
Pharmacy Electrolyte Replacement  Recent Labs:  Recent Labs    10/25/22 1741 10/25/22 1947 10/26/22 0419  K  --    < > 3.7  MG 9.2*   < > 1.7  PHOS 2.5  --   --   CREATININE  --    < > 1.04   < > = values in this interval not displayed.    Low Critical Values (K </= 2.5, Phos </= 1, Mg </= 1) Present: None  Plan: Replete with 40 KCL IV, 2g mag. Recheck in AM.   Esmeralda Arthur, PharmD, BCCCP

## 2022-10-27 DIAGNOSIS — J189 Pneumonia, unspecified organism: Secondary | ICD-10-CM | POA: Diagnosis not present

## 2022-10-27 DIAGNOSIS — E876 Hypokalemia: Secondary | ICD-10-CM

## 2022-10-27 DIAGNOSIS — G40909 Epilepsy, unspecified, not intractable, without status epilepticus: Secondary | ICD-10-CM

## 2022-10-27 LAB — BASIC METABOLIC PANEL
Anion gap: 9 (ref 5–15)
BUN: 14 mg/dL (ref 6–20)
CO2: 27 mmol/L (ref 22–32)
Calcium: 8.7 mg/dL — ABNORMAL LOW (ref 8.9–10.3)
Chloride: 99 mmol/L (ref 98–111)
Creatinine, Ser: 0.82 mg/dL (ref 0.61–1.24)
GFR, Estimated: >60 mL/min (ref >60)
Glucose, Bld: 126 mg/dL — ABNORMAL HIGH (ref 70–99)
Potassium: 3.1 mmol/L — ABNORMAL LOW (ref 3.5–5.1)
Sodium: 135 mmol/L (ref 135–145)

## 2022-10-27 LAB — MAGNESIUM: Magnesium: 1.6 mg/dL — ABNORMAL LOW (ref 1.7–2.4)

## 2022-10-27 LAB — CBC
HCT: 37.9 % — ABNORMAL LOW (ref 39.0–52.0)
Hemoglobin: 13.3 g/dL (ref 13.0–17.0)
MCH: 30.3 pg (ref 26.0–34.0)
MCHC: 35.1 g/dL (ref 30.0–36.0)
MCV: 86.3 fL (ref 80.0–100.0)
Platelets: 179 10*3/uL (ref 150–400)
RBC: 4.39 MIL/uL (ref 4.22–5.81)
RDW: 12.5 % (ref 11.5–15.5)
WBC: 8.5 10*3/uL (ref 4.0–10.5)
nRBC: 0 % (ref 0.0–0.2)

## 2022-10-27 LAB — GLUCOSE, CAPILLARY
Glucose-Capillary: 129 mg/dL — ABNORMAL HIGH (ref 70–99)
Glucose-Capillary: 108 mg/dL — ABNORMAL HIGH (ref 70–99)
Glucose-Capillary: 174 mg/dL — ABNORMAL HIGH (ref 70–99)
Glucose-Capillary: 155 mg/dL — ABNORMAL HIGH (ref 70–99)
Glucose-Capillary: 198 mg/dL — ABNORMAL HIGH (ref 70–99)
Glucose-Capillary: 186 mg/dL — ABNORMAL HIGH (ref 70–99)
Glucose-Capillary: 134 mg/dL — ABNORMAL HIGH (ref 70–99)

## 2022-10-27 MED ORDER — ASPIRIN 81 MG PO CHEW
81.0000 mg | CHEWABLE_TABLET | Freq: Every day | ORAL | Status: DC
  Administered 2022-10-27 – 2022-10-31 (×5): 81 mg via ORAL
  Filled 2022-10-27 (×4): qty 1

## 2022-10-27 MED ORDER — POTASSIUM CHLORIDE CRYS ER 20 MEQ PO TBCR
20.0000 meq | EXTENDED_RELEASE_TABLET | ORAL | Status: AC
  Administered 2022-10-27 (×2): 20 meq via ORAL
  Filled 2022-10-27 (×2): qty 1

## 2022-10-27 MED ORDER — MELATONIN 3 MG PO TABS
3.0000 mg | ORAL_TABLET | Freq: Every day | ORAL | Status: DC
  Administered 2022-10-27 – 2022-10-30 (×4): 3 mg via ORAL
  Filled 2022-10-27 (×4): qty 1

## 2022-10-27 MED ORDER — ASPIRIN 81 MG PO CHEW: 81.0000 mg | CHEWABLE_TABLET | Freq: Every day | ORAL | Status: DC

## 2022-10-27 MED ORDER — POTASSIUM CHLORIDE 10 MEQ/100ML IV SOLN
10.0000 meq | INTRAVENOUS | Status: AC
  Administered 2022-10-27 (×4): 10 meq via INTRAVENOUS
  Filled 2022-10-27 (×3): qty 100

## 2022-10-27 MED ORDER — MAGNESIUM SULFATE 4 GM/100ML IV SOLN
4.0000 g | Freq: Once | INTRAVENOUS | Status: AC
  Administered 2022-10-27: 4 g via INTRAVENOUS
  Filled 2022-10-27: qty 100

## 2022-10-27 NOTE — Progress Notes (Signed)
[facility] ADULT ICU REPLACEMENT PROTOCOL   The patient does apply for the [facility] Adult ICU Electrolyte Replacment Protocol based on the criteria listed below:   1.Exclusion criteria: TCTS, ECMO, Dialysis, and Myasthenia Gravis patients 2. Is GFR >/= 30 ml/min? Yes.    Patient's GFR today is >60 3. Is SCr </= 2? Yes.   Patient's SCr is 0.82 mg/dL 4. Did SCr increase >/= 0.5 in 24 hours? No. 5.Pt's weight >40kg  Yes.   6. Abnormal electrolyte(s): K 3.1, Mag 1.6  7. Electrolytes replaced per protocol 8.  Call MD STAT for K+ </= 2.5, Phos </= 1, or Mag </= 1 Physician:    Ronda Fairly A 10/27/2022 5:34 AM

## 2022-10-27 NOTE — Progress Notes (Signed)
[facility] PROGRESS NOTE   BENJAMYN HESTAND HEN:[id number] DOB: 09-12-1971 DOA: 10/23/2022  PCP: Patient, No Pcp Per  Brief History/Interval Summary: 52 year old male with PMH which is significant for DM, MS on teriflunomide, and HTN who presented to [facility] ED 1/29 with complaint of seizure.  Patient was transferred to [facility].  He had to be emergently intubated.  Neurology was consulted.  Patient underwent LP.  Stabilized and then extubated.  And then transferred to medical service.  Consultants: Neurology.  Critical care medicine.  Procedures: EEG.  Lumbar puncture.    Subjective/Interval History: Patient denies any headaches.  Denies any shortness of breath.  Occasional cough.  No nausea or vomiting.   Assessment/Plan:  New onset seizures/acute encephalopathy Thought to be secondary to hyperglycemia in the setting of chronic bilateral occipital strokes.  Neurology was consulted.  Patient was started on Keppra which has been continued. Continue with seizure precautions. Initially there was suspicion for meningitis due to fever.  Lumbar puncture did not suggest the same.  History of relapsing remitting MS/chronic bilateral occipital strokes Stable for the most part from neurological standpoint.  Continue with aspirin. Followed by Dr. Epimenio Foot with [facility].  Teriflunomide is on hold currently.  Neurology to determine when this medication can be resumed.    Spinal stenosis C4-C6 Stable from a neurological standpoint.  Physical therapy.  Acute respiratory failure with hypoxia/concern for evolving pneumonia Had high oxygen requirements yesterday morning but looks like he has been weaned off.  Empirically started on Unasyn.  Will give him a 5-day course. Respiratory status is stable.  Saturating in the early 90s on room air.  Acute kidney injury Resolved.  Renal function normal this morning.  Monitor renal function.  Monitor urine  output.  Hypokalemia/hypomagnesemia Will be corrected.  Diabetes mellitus type 2 uncontrolled with hyperglycemia Monitor CBGs.  Noted to be on Levemir and SSI.  HbA1c 11.5.  Diabetic foot ulcer Local wound care  Obesity Estimated body mass index is 35.33 kg/m as calculated from the following:   Height as of an earlier encounter on 10/23/22: 5\' 11"  (1.803 m).   Weight as of this encounter: 114.9 kg.  DVT Prophylaxis: Subcutaneous heparin Code Status: Full code Family Communication: Discussed with patient.  No family at bedside Disposition Plan: CIR is recommended  Status is: Inpatient Remains inpatient appropriate because: Seizures      Medications: Scheduled:  aspirin  81 mg Per Tube Daily   Chlorhexidine Gluconate Cloth  6 each Topical Daily   heparin  5,000 Units Subcutaneous Q8H   insulin aspart  0-15 Units Subcutaneous Q4H   insulin detemir  32 Units Subcutaneous Daily   nutrition supplement (JUVEN)  1 packet Oral BID BM   potassium chloride  20 mEq Oral Q4H   Continuous:  sodium chloride Stopped (10/24/22 1208)   sodium chloride     ampicillin-sulbactam (UNASYN) IV 3 g (10/27/22 0916)   levETIRAcetam Stopped (10/27/22 0030)   potassium chloride 100 mL/hr at 10/27/22 0900   12/26/22 chloride, acetaminophen, dextrose, docusate sodium, midazolam, mouth rinse, polyethylene glycol  Antibiotics: Anti-infectives (From admission, onward)    Start     Dose/Rate Route Frequency Ordered Stop   10/26/22 0900  Ampicillin-Sulbactam (UNASYN) 3 g in sodium chloride 0.9 % 100 mL IVPB        3 g 200 mL/hr over 30 Minutes Intravenous Every 6 hours 10/26/22 0803     10/25/22 0259  cefTRIAXone (ROCEPHIN) 2 g in  sodium chloride 0.9 % 100 mL IVPB  Status:  Discontinued        2 g 200 mL/hr over 30 Minutes Intravenous Every 24 hours 10/24/22 0904 10/24/22 1238   10/24/22 1000  acyclovir (ZOVIRAX) 900 mg in dextrose 5 % 250 mL IVPB  Status:  Discontinued        900 mg 268  mL/hr over 60 Minutes Intravenous Every 8 hours 10/24/22 0903 10/24/22 1142   10/23/22 1800  vancomycin (VANCOREADY) IVPB 1500 mg/300 mL  Status:  Discontinued        1,500 mg 150 mL/hr over 120 Minutes Intravenous Every 12 hours 10/23/22 0651 10/23/22 1036   10/23/22 1600  acyclovir (ZOVIRAX) 1,000 mg in dextrose 5 % 250 mL IVPB  Status:  Discontinued        1,000 mg 270 mL/hr over 60 Minutes Intravenous Every 8 hours 10/23/22 1036 10/23/22 2230   10/23/22 1500  cefTRIAXone (ROCEPHIN) 2 g in sodium chloride 0.9 % 100 mL IVPB  Status:  Discontinued        2 g 200 mL/hr over 30 Minutes Intravenous Every 12 hours 10/23/22 1017 10/24/22 0904   10/23/22 1200  cefTRIAXone (ROCEPHIN) 2 g in sodium chloride 0.9 % 100 mL IVPB  Status:  Discontinued        2 g 200 mL/hr over 30 Minutes Intravenous 2 times daily 10/23/22 0647 10/23/22 1017   10/23/22 1130  vancomycin (VANCOCIN) IVPB 1000 mg/200 mL premix  Status:  Discontinued        1,000 mg 200 mL/hr over 60 Minutes Intravenous Every 8 hours 10/23/22 1036 10/24/22 0904   10/23/22 0800  ampicillin (OMNIPEN) 2 g in sodium chloride 0.9 % 100 mL IVPB  Status:  Discontinued        2 g 300 mL/hr over 20 Minutes Intravenous Every 4 hours 10/23/22 0647 10/24/22 0903   10/23/22 0800  acyclovir (ZOVIRAX) 850 mg in dextrose 5 % 250 mL IVPB  Status:  Discontinued        850 mg 267 mL/hr over 60 Minutes Intravenous Every 8 hours 10/23/22 0648 10/23/22 1036       Objective:  Vital Signs  Vitals:   10/27/22 0600 10/27/22 0700 10/27/22 0800 10/27/22 0900  BP: (!) 156/97 (!) 145/82  (!) 147/77  Pulse: 87 86 100 90  Resp: (!) 24 18 (!) 27 (!) 26  Temp:   98 F (36.7 C)   TempSrc:   Oral   SpO2: 94% 94% 94% 93%  Weight:        Intake/Output Summary (Last 24 hours) at 10/27/2022 0931 Last data filed at 10/27/2022 0900 Gross per 24 hour  Intake 1108.78 ml  Output 2700 ml  Net -1591.22 ml   Filed Weights   10/24/22 0401 10/25/22 0500  Weight: 113.6  kg 114.9 kg    General appearance: Awake alert.  In no distress.  Mildly distracted. Resp: Coarse breath sounds with few crackles at the bases.  No wheezing or rhonchi. Cardio: S1-S2 is normal regular.  No S3-S4.  No rubs murmurs or bruit GI: Abdomen is soft.  Nontender nondistended.  Bowel sounds are present normal.  No masses organomegaly Extremities: No edema.  Moving all of his extremities No obvious focal neurological deficits.   Lab Results:  Data Reviewed: I have personally reviewed following labs and reports of the imaging studies  CBC: Recent Labs  Lab 10/23/22 0017 10/23/22 8413 10/23/22 2440 10/23/22 1027 10/24/22 0454 10/25/22 2224 10/26/22 2536  10/27/22 0319  WBC 8.1 9.9  --  10.7* 10.0  --  9.8 8.5  NEUTROABS 4.3 9.1*  --   --   --   --   --   --   HGB 16.7 14.3   < > 14.3 13.1 13.3 13.2 13.3  HCT 50.4 41.1   < > 41.0 37.9* 39.0 38.3* 37.9*  MCV 89.4 86.7  --  87.6 87.3  --  88.2 86.3  PLT 323 178  --  238 179  --  186 179   < > = values in this interval not displayed.    Basic Metabolic Panel: Recent Labs  Lab 10/24/22 0454 10/24/22 1647 10/25/22 0423 10/25/22 1741 10/25/22 1947 10/25/22 2224 10/26/22 0419 10/27/22 0319  NA 137  --  135  --  138 140 139 135  K 3.3*  --  3.5  --  3.2* 3.2* 3.7 3.1*  CL 106  --  104  --  103  --  103 99  CO2 21*  --  24  --  26  --  27 27  GLUCOSE 184*  --  265*  --  118*  --  104* 126*  BUN 10  --  15  --  13  --  14 14  CREATININE 1.09  --  1.00  --  0.92  --  1.04 0.82  CALCIUM 8.6*  --  8.2*  --  8.4*  --  8.4* 8.7*  MG 1.6* 1.8 1.6* 9.2* 2.0  --  1.7 1.6*  PHOS 2.0* 3.8 3.1 2.5  --   --   --   --     GFR: Estimated Creatinine Clearance: 135.8 mL/min (by C-G formula based on SCr of 0.82 mg/dL).  Liver Function Tests: Recent Labs  Lab 10/23/22 0010  AST 36  ALT 15  ALKPHOS 121  BILITOT 0.7  PROT 8.5*  ALBUMIN 3.8    Coagulation Profile: Recent Labs  Lab 10/23/22 0010  INR 1.2    Cardiac  Enzymes: Recent Labs  Lab 10/23/22 0010  CKTOTAL 72     CBG: Recent Labs  Lab 10/26/22 1545 10/26/22 1926 10/26/22 2317 10/27/22 0315 10/27/22 0739  GLUCAP 330* 170* 226* 129* 108*     Recent Results (from the past 240 hour(s))  Culture, blood (routine x 2)     Status: None (Preliminary result)   Collection Time: 10/23/22 12:17 AM   Specimen: BLOOD RIGHT ARM  Result Value Ref Range Status   Specimen Description BLOOD RIGHT ARM  Final   Special Requests   Final    BOTTLES DRAWN AEROBIC AND ANAEROBIC Blood Culture adequate volume   Culture   Final    NO GROWTH 4 DAYS Performed at [facility], [address], [place], [place] [address]    Report Status PENDING  Incomplete  Culture, blood (routine x 2)     Status: None (Preliminary result)   Collection Time: 10/23/22 12:17 AM   Specimen: BLOOD RIGHT ARM  Result Value Ref Range Status   Specimen Description BLOOD RIGHT ARM  Final   Special Requests   Final    BOTTLES DRAWN AEROBIC AND ANAEROBIC Blood Culture results may not be optimal due to an inadequate volume of blood received in culture bottles   Culture   Final    NO GROWTH 4 DAYS Performed at [facility], [address] [place], [place] [address]    Report Status PENDING  Incomplete  Resp panel  by RT-PCR (RSV, Flu A&B, Covid) Anterior Nasal Swab     Status: None   Collection Time: 10/23/22 12:17 AM   Specimen: Anterior Nasal Swab  Result Value Ref Range Status   SARS Coronavirus 2 by RT PCR NEGATIVE NEGATIVE Final    Comment: (NOTE) SARS-CoV-2 target nucleic acids are NOT DETECTED.  The SARS-CoV-2 RNA is generally detectable in upper respiratory specimens during the acute phase of infection. The lowest concentration of SARS-CoV-2 viral copies this assay can detect is 138 copies/mL. A negative result does not preclude SARS-Cov-2 infection and should not be used as the sole basis for treatment or other patient management decisions. A  negative result may occur with  improper specimen collection/handling, submission of specimen other than nasopharyngeal swab, presence of viral mutation(s) within the areas targeted by this assay, and inadequate number of viral copies(<138 copies/mL). A negative result must be combined with clinical observations, patient history, and epidemiological information. The expected result is Negative.  Fact Sheet for Patients:  BloggerCourse.com  Fact Sheet for Healthcare Providers:  SeriousBroker.it  This test is no t yet approved or cleared by the [place] FDA and  has been authorized for detection and/or diagnosis of SARS-CoV-2 by FDA under an Emergency Use Authorization (EUA). This EUA will remain  in effect (meaning this test can be used) for the duration of the COVID-19 declaration under Section 564(b)(1) of the Act, 21 U.S.C.section 360bbb-3(b)(1), unless the authorization is terminated  or revoked sooner.       Influenza A by PCR NEGATIVE NEGATIVE Final   Influenza B by PCR NEGATIVE NEGATIVE Final    Comment: (NOTE) The Xpert Xpress SARS-CoV-2/FLU/RSV plus assay is intended as an aid in the diagnosis of influenza from Nasopharyngeal swab specimens and should not be used as a sole basis for treatment. Nasal washings and aspirates are unacceptable for Xpert Xpress SARS-CoV-2/FLU/RSV testing.  Fact Sheet for Patients: BloggerCourse.com  Fact Sheet for Healthcare Providers: SeriousBroker.it  This test is not yet approved or cleared by the [place] FDA and has been authorized for detection and/or diagnosis of SARS-CoV-2 by FDA under an Emergency Use Authorization (EUA). This EUA will remain in effect (meaning this test can be used) for the duration of the COVID-19 declaration under Section 564(b)(1) of the Act, 21 U.S.C. section 360bbb-3(b)(1), unless the authorization  is terminated or revoked.     Resp Syncytial Virus by PCR NEGATIVE NEGATIVE Final    Comment: (NOTE) Fact Sheet for Patients: BloggerCourse.com  Fact Sheet for Healthcare Providers: SeriousBroker.it  This test is not yet approved or cleared by the [place] FDA and has been authorized for detection and/or diagnosis of SARS-CoV-2 by FDA under an Emergency Use Authorization (EUA). This EUA will remain in effect (meaning this test can be used) for the duration of the COVID-19 declaration under Section 564(b)(1) of the Act, 21 U.S.C. section 360bbb-3(b)(1), unless the authorization is terminated or revoked.  Performed at [facility], [address] [place], [place] [address]   Culture, Urine (Do not remove urinary catheter, catheter placed by urology or difficult to place)     Status: None   Collection Time: 10/23/22 12:17 AM   Specimen: Urine, Catheterized  Result Value Ref Range Status   Specimen Description   Final    URINE, CATHETERIZED Performed at [facility], [address] [place], [place] [address]    Special Requests   Final    NONE Performed at [facility], [address] [place], [place] [address]    Culture   Final    NO GROWTH Performed at [facility], [place] [address] [place], [place] [address]    Report Status 10/24/2022 FINAL  Final  Aerobic/Anaerobic Culture w Gram Stain (surgical/deep wound)     Status: None (Preliminary result)   Collection Time: 10/23/22  1:03 AM   Specimen: Foot  Result Value Ref Range Status   Specimen Description   Final    FOOT LEFT Performed at [facility], [address] [place], [place] [address]    Special Requests   Final    NONE Performed at [facility], [address] [place], [place] [address]    Gram Stain   Final    RARE WBC PRESENT, PREDOMINANTLY PMN MODERATE GRAM POSITIVE COCCI RARE BUDDING YEAST  SEEN Performed at [facility], [place] [address], [place], [place] [address]    Culture   Final    RARE PROTEUS MIRABILIS ABUNDANT STAPHYLOCOCCUS AUREUS ABUNDANT STREPTOCOCCUS AGALACTIAE TESTING AGAINST S. AGALACTIAE NOT ROUTINELY PERFORMED DUE TO PREDICTABILITY OF AMP/PEN/VAN SUSCEPTIBILITY. SUSCEPTIBILITIES TO FOLLOW STAPHYLOCOCCUS AUREUS    Report Status PENDING  Incomplete   Organism ID, Bacteria PROTEUS MIRABILIS  Final      Susceptibility   Proteus mirabilis - MIC*    AMPICILLIN <=2 SENSITIVE Sensitive     CEFAZOLIN <=4 SENSITIVE Sensitive     CEFEPIME <=0.12 SENSITIVE Sensitive     CEFTAZIDIME <=1 SENSITIVE Sensitive     CEFTRIAXONE <=0.25 SENSITIVE Sensitive     CIPROFLOXACIN <=0.25 SENSITIVE Sensitive     GENTAMICIN >=16 RESISTANT Resistant     IMIPENEM 2 SENSITIVE Sensitive     TRIMETH/SULFA >=320 RESISTANT Resistant     AMPICILLIN/SULBACTAM <=2 SENSITIVE Sensitive     PIP/TAZO <=4 SENSITIVE Sensitive     * RARE PROTEUS MIRABILIS  MRSA Next Gen by PCR, Nasal     Status: None   Collection Time: 10/23/22  9:39 AM   Specimen: Nasal Mucosa; Nasal Swab  Result Value Ref Range Status   MRSA by PCR Next Gen NOT DETECTED NOT DETECTED Final    Comment: (NOTE) The GeneXpert MRSA Assay (FDA approved for NASAL specimens only), is one component of a comprehensive MRSA colonization surveillance program. It is not intended to diagnose MRSA infection nor to guide or monitor treatment for MRSA infections. Test performance is not FDA approved in patients less than 36 years old. Performed at [facility], [place] [address] [place], [place] [address]   CSF culture w Gram Stain     Status: None   Collection Time: 10/23/22  1:36 PM   Specimen: PATH Cytology CSF; Cerebrospinal Fluid  Result Value Ref Range Status   Specimen Description CSF  Final   Special Requests NONE  Final   Gram Stain   Final    WBC PRESENT,BOTH PMN AND MONONUCLEAR NO ORGANISMS SEEN CYTOSPIN SMEAR     Culture   Final    NO GROWTH Performed at [facility], [address], [place], [place] [address]    Report Status 10/26/2022 FINAL  Final  Culture, fungus without smear     Status: None (Preliminary result)   Collection Time: 10/23/22  1:36 PM   Specimen: PATH Cytology CSF; Cerebrospinal Fluid  Result Value Ref Range Status   Specimen Description CSF  Final   Special Requests NONE  Final   Culture   Final    NO FUNGUS ISOLATED AFTER 3  DAYS Performed at [facility], [place] [address], [place], [place] [address]    Report Status PENDING  Incomplete  Anaerobic culture w Gram Stain     Status: None (Preliminary result)   Collection Time: 10/23/22  1:36 PM   Specimen: PATH Cytology CSF; Cerebrospinal Fluid  Result Value Ref Range Status   Specimen Description CSF  Final   Special Requests NONE  Final   Gram Stain   Final    WBC PRESENT,BOTH PMN AND MONONUCLEAR NO ORGANISMS SEEN CYTOSPIN SMEAR Performed at [facility], [place] [address] [place], [place] [address]    Culture   Final    NO ANAEROBES ISOLATED; CULTURE IN PROGRESS FOR 5 DAYS   Report Status PENDING  Incomplete      Radiology Studies: DG CHEST PORT 1 VIEW  Result Date: 10/26/2022 CLINICAL DATA:  Hypoxia. EXAM: PORTABLE CHEST 1 VIEW COMPARISON:  October 25, 2022 at 10:39 p.m. FINDINGS: EKG leads project over the chest. Cardiomediastinal contours and hilar structures are normal. Lungs are free from consolidation. No gross pleural effusion. Signs of vascular crowding the setting of mildly low lung volumes. Subtle increased interstitial prominence RIGHT greater than LEFT and also seen at the LEFT lung base, could be defect related to vascular crowding. On limited assessment no acute skeletal findings. IMPRESSION: Low lung volumes with vascular crowding. Mild increased interstitial prominence in the RIGHT mid and lower chest in lower chest could be related to vascular crowding effective low lung volumes. Mild  pneumonitis is not excluded attention on follow-up. Electronically Signed   By: Zetta Bills M.D.   On: 10/26/2022 08:19   DG Chest Port 1 View  Result Date: 10/25/2022 CLINICAL DATA:  Acute respiratory failure EXAM: PORTABLE CHEST 1 VIEW COMPARISON:  10/24/2022 FINDINGS: The heart size and mediastinal contours are within normal limits. Both lungs are clear. The visualized skeletal structures are unremarkable. IMPRESSION: No active disease. Electronically Signed   By: Inez Catalina M.D.   On: 10/25/2022 22:49       LOS: 4 days   [facility] Pager on [email or web address]  10/27/2022, 9:31 AM

## 2022-10-28 DIAGNOSIS — J189 Pneumonia, unspecified organism: Secondary | ICD-10-CM | POA: Diagnosis not present

## 2022-10-28 DIAGNOSIS — G40909 Epilepsy, unspecified, not intractable, without status epilepticus: Secondary | ICD-10-CM | POA: Diagnosis not present

## 2022-10-28 LAB — ANAEROBIC CULTURE W GRAM STAIN

## 2022-10-28 LAB — GLUCOSE, CAPILLARY
Glucose-Capillary: 97 mg/dL (ref 70–99)
Glucose-Capillary: 116 mg/dL — ABNORMAL HIGH (ref 70–99)
Glucose-Capillary: 135 mg/dL — ABNORMAL HIGH (ref 70–99)

## 2022-10-28 LAB — MAGNESIUM: Magnesium: 1.8 mg/dL (ref 1.7–2.4)

## 2022-10-28 LAB — AEROBIC/ANAEROBIC CULTURE W GRAM STAIN (SURGICAL/DEEP WOUND)

## 2022-10-28 LAB — CBC
HCT: 37.9 % — ABNORMAL LOW (ref 39.0–52.0)
Hemoglobin: 12.9 g/dL — ABNORMAL LOW (ref 13.0–17.0)
MCH: 30 pg (ref 26.0–34.0)
MCHC: 34 g/dL (ref 30.0–36.0)
MCV: 88.1 fL (ref 80.0–100.0)
Platelets: 213 10*3/uL (ref 150–400)
RBC: 4.3 MIL/uL (ref 4.22–5.81)
RDW: 12.5 % (ref 11.5–15.5)
WBC: 6.8 10*3/uL (ref 4.0–10.5)
nRBC: 0 % (ref 0.0–0.2)

## 2022-10-28 LAB — CULTURE, BLOOD (ROUTINE X 2)
Culture: NO GROWTH
Culture: NO GROWTH
Special Requests: ADEQUATE

## 2022-10-28 LAB — BASIC METABOLIC PANEL
Anion gap: 11 (ref 5–15)
BUN: 11 mg/dL (ref 6–20)
CO2: 26 mmol/L (ref 22–32)
Calcium: 8.7 mg/dL — ABNORMAL LOW (ref 8.9–10.3)
Chloride: 97 mmol/L — ABNORMAL LOW (ref 98–111)
Creatinine, Ser: 0.97 mg/dL (ref 0.61–1.24)
GFR, Estimated: >60 mL/min (ref >60)
Glucose, Bld: 90 mg/dL (ref 70–99)
Potassium: 3.7 mmol/L (ref 3.5–5.1)
Sodium: 134 mmol/L — ABNORMAL LOW (ref 135–145)

## 2022-10-28 MED ORDER — FINASTERIDE 5 MG PO TABS
5.0000 mg | ORAL_TABLET | Freq: Every day | ORAL | Status: DC
  Administered 2022-10-28 – 2022-10-31 (×4): 5 mg via ORAL
  Filled 2022-10-28 (×4): qty 1

## 2022-10-28 MED ORDER — INSULIN ASPART 100 UNIT/ML IJ SOLN
0.0000 [IU] | Freq: Three times a day (TID) | INTRAMUSCULAR | Status: DC
  Administered 2022-10-29 (×2): 3 [IU] via SUBCUTANEOUS
  Administered 2022-10-29 – 2022-10-30 (×3): 5 [IU] via SUBCUTANEOUS
  Administered 2022-10-30 – 2022-10-31 (×2): 3 [IU] via SUBCUTANEOUS

## 2022-10-28 MED ORDER — BRIMONIDINE TARTRATE 0.2 % OP SOLN
1.0000 [drp] | Freq: Two times a day (BID) | OPHTHALMIC | Status: DC
  Administered 2022-10-28 – 2022-10-31 (×7): 1 [drp] via OPHTHALMIC
  Filled 2022-10-28: qty 5

## 2022-10-28 MED ORDER — POTASSIUM CHLORIDE CRYS ER 20 MEQ PO TBCR
20.0000 meq | EXTENDED_RELEASE_TABLET | Freq: Once | ORAL | Status: AC
  Administered 2022-10-28: 20 meq via ORAL
  Filled 2022-10-28: qty 1

## 2022-10-28 MED ORDER — MAGNESIUM SULFATE 2 GM/50ML IV SOLN
2.0000 g | Freq: Once | INTRAVENOUS | Status: AC
  Administered 2022-10-28: 2 g via INTRAVENOUS
  Filled 2022-10-28: qty 50

## 2022-10-28 MED ORDER — BENZONATATE 100 MG PO CAPS
100.0000 mg | ORAL_CAPSULE | Freq: Three times a day (TID) | ORAL | Status: DC | PRN
  Administered 2022-10-28 – 2022-10-30 (×7): 100 mg via ORAL
  Filled 2022-10-28 (×7): qty 1

## 2022-10-28 MED ORDER — INSULIN DETEMIR 100 UNIT/ML ~~LOC~~ SOLN
30.0000 [IU] | Freq: Every day | SUBCUTANEOUS | Status: DC
  Administered 2022-10-29 – 2022-10-30 (×2): 30 [IU] via SUBCUTANEOUS
  Filled 2022-10-28 (×3): qty 0.3

## 2022-10-28 MED ORDER — LEVETIRACETAM 500 MG PO TABS
500.0000 mg | ORAL_TABLET | Freq: Two times a day (BID) | ORAL | Status: DC
  Administered 2022-10-28 – 2022-10-31 (×7): 500 mg via ORAL
  Filled 2022-10-28 (×7): qty 1

## 2022-10-28 NOTE — Progress Notes (Signed)
[facility] PROGRESS NOTE   [name] ULA:[id number] DOB: Feb 01, 1971 DOA: 10/23/2022  PCP: Patient, No Pcp Per  Brief History/Interval Summary: 52 year old male with PMH which is significant for DM, MS on teriflunomide, and HTN who presented to [facility] ED 1/29 with complaint of seizure.  Patient was transferred to [facility].  He had to be emergently intubated.  Neurology was consulted.  Patient underwent LP.  Stabilized and then extubated.  And then transferred to medical service.  Consultants: Neurology.  Critical care medicine.  Procedures: EEG.  Lumbar puncture.    Subjective/Interval History: Patient feels well.  Denies any pain issues.  No shortness of breath.  No headaches.   Assessment/Plan:  New onset seizures/acute encephalopathy Thought to be secondary to hyperglycemia in the setting of chronic bilateral occipital strokes.  Neurology was consulted.  Patient was started on Keppra which has been continued. Continue with seizure precautions. Initially there was suspicion for meningitis due to fever.  Lumbar puncture did not suggest the same. Will change Keppra to oral route.  History of relapsing remitting MS/chronic bilateral occipital strokes Stable for the most part from neurological standpoint.  Continue with aspirin. Followed by Dr. Epimenio Foot with [facility].  Teriflunomide is on hold currently.  Neurology to determine when this medication can be resumed.  Will ask neurology tomorrow.  Spinal stenosis C4-C6 Stable from a neurological standpoint.  Physical therapy.  Acute respiratory failure with hypoxia/concern for evolving pneumonia Patient had high oxygen requirements initially but he was weaned off.  Currently on room air.  Continue with Unasyn for 5 days.   Respiratory status is stable.  Acute kidney injury Resolved.  Monitor urine output.    Hypokalemia/hypomagnesemia Repleted.  Give additional dose of potassium and  magnesium today.  Diabetes mellitus type 2 uncontrolled with hyperglycemia HbA1c 11.5.  Noted to be on level.  SSI.  CBGs are reasonably well-controlled.    Diabetic foot ulcer Local wound care  Obesity Estimated body mass index is 33.52 kg/m as calculated from the following:   Height as of an earlier encounter on 10/23/22: 5\' 11"  (1.803 m).   Weight as of this encounter: 109 kg.  DVT Prophylaxis: Subcutaneous heparin Code Status: Full code Family Communication: Discussed with patient.  No family at bedside Disposition Plan: CIR is recommended  Status is: Inpatient Remains inpatient appropriate because: Seizures      Medications: Scheduled:  aspirin  81 mg Oral Daily   Chlorhexidine Gluconate Cloth  6 each Topical Daily   heparin  5,000 Units Subcutaneous Q8H   insulin aspart  0-15 Units Subcutaneous Q4H   insulin detemir  32 Units Subcutaneous Daily   melatonin  3 mg Oral QHS   nutrition supplement (JUVEN)  1 packet Oral BID BM   Continuous:  sodium chloride Stopped (10/24/22 1208)   sodium chloride     ampicillin-sulbactam (UNASYN) IV 3 g (10/28/22 0905)   levETIRAcetam 500 mg (10/27/22 2305)   12/26/22 chloride, acetaminophen, dextrose, docusate sodium, midazolam, mouth rinse, polyethylene glycol  Antibiotics: Anti-infectives (From admission, onward)    Start     Dose/Rate Route Frequency Ordered Stop   10/26/22 0900  Ampicillin-Sulbactam (UNASYN) 3 g in sodium chloride 0.9 % 100 mL IVPB        3 g 200 mL/hr over 30 Minutes Intravenous Every 6 hours 10/26/22 0803 10/31/22 0859   10/25/22 0259  cefTRIAXone (ROCEPHIN) 2 g in sodium chloride 0.9 % 100 mL IVPB  Status:  Discontinued        2 g 200 mL/hr over 30 Minutes Intravenous Every 24 hours 10/24/22 0904 10/24/22 1238   10/24/22 1000  acyclovir (ZOVIRAX) 900 mg in dextrose 5 % 250 mL IVPB  Status:  Discontinued        900 mg 268 mL/hr over 60 Minutes Intravenous Every 8 hours 10/24/22 0903 10/24/22 1142    10/23/22 1800  vancomycin (VANCOREADY) IVPB 1500 mg/300 mL  Status:  Discontinued        1,500 mg 150 mL/hr over 120 Minutes Intravenous Every 12 hours 10/23/22 0651 10/23/22 1036   10/23/22 1600  acyclovir (ZOVIRAX) 1,000 mg in dextrose 5 % 250 mL IVPB  Status:  Discontinued        1,000 mg 270 mL/hr over 60 Minutes Intravenous Every 8 hours 10/23/22 1036 10/23/22 2230   10/23/22 1500  cefTRIAXone (ROCEPHIN) 2 g in sodium chloride 0.9 % 100 mL IVPB  Status:  Discontinued        2 g 200 mL/hr over 30 Minutes Intravenous Every 12 hours 10/23/22 1017 10/24/22 0904   10/23/22 1200  cefTRIAXone (ROCEPHIN) 2 g in sodium chloride 0.9 % 100 mL IVPB  Status:  Discontinued        2 g 200 mL/hr over 30 Minutes Intravenous 2 times daily 10/23/22 0647 10/23/22 1017   10/23/22 1130  vancomycin (VANCOCIN) IVPB 1000 mg/200 mL premix  Status:  Discontinued        1,000 mg 200 mL/hr over 60 Minutes Intravenous Every 8 hours 10/23/22 1036 10/24/22 0904   10/23/22 0800  ampicillin (OMNIPEN) 2 g in sodium chloride 0.9 % 100 mL IVPB  Status:  Discontinued        2 g 300 mL/hr over 20 Minutes Intravenous Every 4 hours 10/23/22 0647 10/24/22 0903   10/23/22 0800  acyclovir (ZOVIRAX) 850 mg in dextrose 5 % 250 mL IVPB  Status:  Discontinued        850 mg 267 mL/hr over 60 Minutes Intravenous Every 8 hours 10/23/22 0648 10/23/22 1036       Objective:  Vital Signs  Vitals:   10/27/22 0900 10/27/22 1000 10/28/22 0500 10/28/22 0900  BP: (!) 147/77 (!) 144/78  127/88  Pulse: 90 86  87  Resp: (!) 26 (!) 24    Temp:    98.5 F (36.9 C)  TempSrc:    Oral  SpO2: 93% 93%  95%  Weight:   109 kg     Intake/Output Summary (Last 24 hours) at 10/28/2022 1006 Last data filed at 10/28/2022 0726 Gross per 24 hour  Intake --  Output 2200 ml  Net -2200 ml    Filed Weights   10/24/22 0401 10/25/22 0500 10/28/22 0500  Weight: 113.6 kg 114.9 kg 109 kg    General appearance: Awake alert.  In no distress Resp:  Clear to auscultation bilaterally.  Normal effort Cardio: S1-S2 is normal regular.  No S3-S4.  No rubs murmurs or bruit GI: Abdomen is soft.  Nontender nondistended.  Bowel sounds are present normal.  No masses organomegaly Extremities: No edema.  Moving his extremities. No obvious focal neurological deficits noted.   Lab Results:  Data Reviewed: I have personally reviewed following labs and reports of the imaging studies  CBC: Recent Labs  Lab 10/23/22 0017 10/23/22 0311 10/23/22 0605 10/23/22 0847 10/24/22 0454 10/25/22 2224 10/26/22 0419 10/27/22 0319 10/28/22 0244  WBC 8.1 9.9  --  10.7* 10.0  --  9.8 8.5  6.8  NEUTROABS 4.3 9.1*  --   --   --   --   --   --   --   HGB 16.7 14.3   < > 14.3 13.1 13.3 13.2 13.3 12.9*  HCT 50.4 41.1   < > 41.0 37.9* 39.0 38.3* 37.9* 37.9*  MCV 89.4 86.7  --  87.6 87.3  --  88.2 86.3 88.1  PLT 323 178  --  238 179  --  186 179 213   < > = values in this interval not displayed.     Basic Metabolic Panel: Recent Labs  Lab 10/24/22 0454 10/24/22 1647 10/25/22 0423 10/25/22 1741 10/25/22 1947 10/25/22 2224 10/26/22 0419 10/27/22 0319 10/28/22 0244  NA 137  --  135  --  138 140 139 135 134*  K 3.3*  --  3.5  --  3.2* 3.2* 3.7 3.1* 3.7  CL 106  --  104  --  103  --  103 99 97*  CO2 21*  --  24  --  26  --  27 27 26   GLUCOSE 184*  --  265*  --  118*  --  104* 126* 90  BUN 10  --  15  --  13  --  14 14 11   CREATININE 1.09  --  1.00  --  0.92  --  1.04 0.82 0.97  CALCIUM 8.6*  --  8.2*  --  8.4*  --  8.4* 8.7* 8.7*  MG 1.6* 1.8 1.6* 9.2* 2.0  --  1.7 1.6* 1.8  PHOS 2.0* 3.8 3.1 2.5  --   --   --   --   --      GFR: Estimated Creatinine Clearance: 111.9 mL/min (by C-G formula based on SCr of 0.97 mg/dL).  Liver Function Tests: Recent Labs  Lab 10/23/22 0010  AST 36  ALT 15  ALKPHOS 121  BILITOT 0.7  PROT 8.5*  ALBUMIN 3.8     Coagulation Profile: Recent Labs  Lab 10/23/22 0010  INR 1.2     Cardiac  Enzymes: Recent Labs  Lab 10/23/22 0010  CKTOTAL 72      CBG: Recent Labs  Lab 10/27/22 2004 10/27/22 2106 10/27/22 2312 10/28/22 0336 10/28/22 0718  GLUCAP 198* 186* 134* 97 116*      Recent Results (from the past 240 hour(s))  Culture, blood (routine x 2)     Status: None   Collection Time: 10/23/22 12:17 AM   Specimen: BLOOD RIGHT ARM  Result Value Ref Range Status   Specimen Description BLOOD RIGHT ARM  Final   Special Requests   Final    BOTTLES DRAWN AEROBIC AND ANAEROBIC Blood Culture adequate volume   Culture   Final    NO GROWTH 5 DAYS Performed at [facility], [address] [place], [address] [place]    Report Status 10/28/2022 FINAL  Final  Culture, blood (routine x 2)     Status: None   Collection Time: 10/23/22 12:17 AM   Specimen: BLOOD RIGHT ARM  Result Value Ref Range Status   Specimen Description BLOOD RIGHT ARM  Final   Special Requests   Final    BOTTLES DRAWN AEROBIC AND ANAEROBIC Blood Culture results may not be optimal due to an inadequate volume of blood received in culture bottles   Culture   Final    NO GROWTH 5 DAYS Performed at [facility], [address] [place], [address] [place]  Report Status 10/28/2022 FINAL  Final  Resp panel by RT-PCR (RSV, Flu A&B, Covid) Anterior Nasal Swab     Status: None   Collection Time: 10/23/22 12:17 AM   Specimen: Anterior Nasal Swab  Result Value Ref Range Status   SARS Coronavirus 2 by RT PCR NEGATIVE NEGATIVE Final    Comment: (NOTE) SARS-CoV-2 target nucleic acids are NOT DETECTED.  The SARS-CoV-2 RNA is generally detectable in upper respiratory specimens during the acute phase of infection. The lowest concentration of SARS-CoV-2 viral copies this assay can detect is 138 copies/mL. A negative result does not preclude SARS-Cov-2 infection and should not be used as the sole basis for treatment or other patient management decisions. A negative result may occur with   improper specimen collection/handling, submission of specimen other than nasopharyngeal swab, presence of viral mutation(s) within the areas targeted by this assay, and inadequate number of viral copies(<138 copies/mL). A negative result must be combined with clinical observations, patient history, and epidemiological information. The expected result is Negative.  Fact Sheet for Patients:  EntrepreneurPulse.com.au  Fact Sheet for Healthcare Providers:  IncredibleEmployment.be  This test is no t yet approved or cleared by the [place] FDA and  has been authorized for detection and/or diagnosis of SARS-CoV-2 by FDA under an Emergency Use Authorization (EUA). This EUA will remain  in effect (meaning this test can be used) for the duration of the COVID-19 declaration under Section 564(b)(1) of the Act, 21 U.S.C.section 360bbb-3(b)(1), unless the authorization is terminated  or revoked sooner.       Influenza A by PCR NEGATIVE NEGATIVE Final   Influenza B by PCR NEGATIVE NEGATIVE Final    Comment: (NOTE) The Xpert Xpress SARS-CoV-2/FLU/RSV plus assay is intended as an aid in the diagnosis of influenza from Nasopharyngeal swab specimens and should not be used as a sole basis for treatment. Nasal washings and aspirates are unacceptable for Xpert Xpress SARS-CoV-2/FLU/RSV testing.  Fact Sheet for Patients: EntrepreneurPulse.com.au  Fact Sheet for Healthcare Providers: IncredibleEmployment.be  This test is not yet approved or cleared by the [place] FDA and has been authorized for detection and/or diagnosis of SARS-CoV-2 by FDA under an Emergency Use Authorization (EUA). This EUA will remain in effect (meaning this test can be used) for the duration of the COVID-19 declaration under Section 564(b)(1) of the Act, 21 U.S.C. section 360bbb-3(b)(1), unless the authorization is terminated or revoked.      Resp Syncytial Virus by PCR NEGATIVE NEGATIVE Final    Comment: (NOTE) Fact Sheet for Patients: EntrepreneurPulse.com.au  Fact Sheet for Healthcare Providers: IncredibleEmployment.be  This test is not yet approved or cleared by the [place] FDA and has been authorized for detection and/or diagnosis of SARS-CoV-2 by FDA under an Emergency Use Authorization (EUA). This EUA will remain in effect (meaning this test can be used) for the duration of the COVID-19 declaration under Section 564(b)(1) of the Act, 21 U.S.C. section 360bbb-3(b)(1), unless the authorization is terminated or revoked.  Performed at [facility], [address] [place], [place] [address]   Culture, Urine (Do not remove urinary catheter, catheter placed by urology or difficult to place)     Status: None   Collection Time: 10/23/22 12:17 AM   Specimen: Urine, Catheterized  Result Value Ref Range Status   Specimen Description   Final    URINE, CATHETERIZED Performed at [facility], [address], [place], [place] [address]    Special Requests   Final  NONE Performed at [facility], [address] [place], [place] [address]    Culture   Final    NO GROWTH Performed at [facility], [address], [place], [place] [address]    Report Status 10/24/2022 FINAL  Final  Aerobic/Anaerobic Culture w Gram Stain (surgical/deep wound)     Status: None (Preliminary result)   Collection Time: 10/23/22  1:03 AM   Specimen: Foot  Result Value Ref Range Status   Specimen Description   Final    FOOT LEFT Performed at [facility], [address] [place], [place] [address]    Special Requests   Final    NONE Performed at [facility], [address] [place], [place] [address]    Gram Stain   Final    RARE WBC PRESENT, PREDOMINANTLY PMN MODERATE GRAM POSITIVE COCCI RARE BUDDING YEAST SEEN    Culture   Final     RARE PROTEUS MIRABILIS ABUNDANT STAPHYLOCOCCUS AUREUS ABUNDANT STREPTOCOCCUS AGALACTIAE TESTING AGAINST S. AGALACTIAE NOT ROUTINELY PERFORMED DUE TO PREDICTABILITY OF AMP/PEN/VAN SUSCEPTIBILITY. Performed at [facility], [address], [place], [place] [address]    Report Status PENDING  Incomplete   Organism ID, Bacteria PROTEUS MIRABILIS  Final   Organism ID, Bacteria STAPHYLOCOCCUS AUREUS  Final      Susceptibility   Proteus mirabilis - MIC*    AMPICILLIN <=2 SENSITIVE Sensitive     CEFAZOLIN <=4 SENSITIVE Sensitive     CEFEPIME <=0.12 SENSITIVE Sensitive     CEFTAZIDIME <=1 SENSITIVE Sensitive     CEFTRIAXONE <=0.25 SENSITIVE Sensitive     CIPROFLOXACIN <=0.25 SENSITIVE Sensitive     GENTAMICIN >=16 RESISTANT Resistant     IMIPENEM 2 SENSITIVE Sensitive     TRIMETH/SULFA >=320 RESISTANT Resistant     AMPICILLIN/SULBACTAM <=2 SENSITIVE Sensitive     PIP/TAZO <=4 SENSITIVE Sensitive     * RARE PROTEUS MIRABILIS   Staphylococcus aureus - MIC*    CIPROFLOXACIN >=8 RESISTANT Resistant     ERYTHROMYCIN >=8 RESISTANT Resistant     GENTAMICIN <=0.5 SENSITIVE Sensitive     OXACILLIN >=4 RESISTANT Resistant     TETRACYCLINE <=1 SENSITIVE Sensitive     VANCOMYCIN <=0.5 SENSITIVE Sensitive     TRIMETH/SULFA 160 RESISTANT Resistant     CLINDAMYCIN >=8 RESISTANT Resistant     RIFAMPIN <=0.5 SENSITIVE Sensitive     Inducible Clindamycin NEGATIVE Sensitive     * ABUNDANT STAPHYLOCOCCUS AUREUS  MRSA Next Gen by PCR, Nasal     Status: None   Collection Time: 10/23/22  9:39 AM   Specimen: Nasal Mucosa; Nasal Swab  Result Value Ref Range Status   MRSA by PCR Next Gen NOT DETECTED NOT DETECTED Final    Comment: (NOTE) The GeneXpert MRSA Assay (FDA approved for NASAL specimens only), is one component of a comprehensive MRSA colonization surveillance program. It is not intended to diagnose MRSA infection nor to guide or monitor treatment for MRSA infections. Test performance is not  FDA approved in patients less than 95 years old. Performed at [facility], [address] [place], [place] [address]   CSF culture w Gram Stain     Status: None   Collection Time: 10/23/22  1:36 PM   Specimen: PATH Cytology CSF; Cerebrospinal Fluid  Result Value Ref Range Status   Specimen Description CSF  Final   Special Requests NONE  Final   Gram Stain   Final    WBC  PRESENT,BOTH PMN AND MONONUCLEAR NO ORGANISMS SEEN CYTOSPIN SMEAR    Culture   Final    NO GROWTH Performed at [facility], [place] [address] [place], [place] [address]    Report Status 10/26/2022 FINAL  Final  Culture, fungus without smear     Status: None (Preliminary result)   Collection Time: 10/23/22  1:36 PM   Specimen: PATH Cytology CSF; Cerebrospinal Fluid  Result Value Ref Range Status   Specimen Description CSF  Final   Special Requests NONE  Final   Culture   Final    NO FUNGUS ISOLATED AFTER 3 DAYS Performed at [facility], [place] [address] [place], [place] [address]    Report Status PENDING  Incomplete  Anaerobic culture w Gram Stain     Status: None (Preliminary result)   Collection Time: 10/23/22  1:36 PM   Specimen: PATH Cytology CSF; Cerebrospinal Fluid  Result Value Ref Range Status   Specimen Description CSF  Final   Special Requests NONE  Final   Gram Stain   Final    WBC PRESENT,BOTH PMN AND MONONUCLEAR NO ORGANISMS SEEN CYTOSPIN SMEAR Performed at [facility], [place] [address], [place], [place] [address]    Culture   Final    NO ANAEROBES ISOLATED; CULTURE IN PROGRESS FOR 5 DAYS   Report Status PENDING  Incomplete      Radiology Studies: No results found.     LOS: 5 days   [place] [employer] on [email or web address]  10/28/2022, 10:06 AM

## 2022-10-29 ENCOUNTER — Other Ambulatory Visit: Payer: Self-pay

## 2022-10-29 ENCOUNTER — Encounter (HOSPITAL_COMMUNITY): Payer: Self-pay | Admitting: Pulmonary Disease

## 2022-10-29 DIAGNOSIS — G40909 Epilepsy, unspecified, not intractable, without status epilepticus: Secondary | ICD-10-CM | POA: Diagnosis not present

## 2022-10-29 DIAGNOSIS — J189 Pneumonia, unspecified organism: Secondary | ICD-10-CM | POA: Diagnosis not present

## 2022-10-29 LAB — GLUCOSE, CAPILLARY
Glucose-Capillary: 178 mg/dL — ABNORMAL HIGH (ref 70–99)
Glucose-Capillary: 163 mg/dL — ABNORMAL HIGH (ref 70–99)
Glucose-Capillary: 214 mg/dL — ABNORMAL HIGH (ref 70–99)
Glucose-Capillary: 165 mg/dL — ABNORMAL HIGH (ref 70–99)
Glucose-Capillary: 198 mg/dL — ABNORMAL HIGH (ref 70–99)

## 2022-10-29 LAB — MISC LABCORP TEST (SEND OUT): Labcorp test code: 828733

## 2022-10-29 MED ORDER — AMOXICILLIN-POT CLAVULANATE 875-125 MG PO TABS
1.0000 | ORAL_TABLET | Freq: Two times a day (BID) | ORAL | Status: AC
  Administered 2022-10-29 – 2022-10-30 (×4): 1 via ORAL
  Filled 2022-10-29 (×4): qty 1

## 2022-10-29 MED ORDER — TERIFLUNOMIDE 14 MG PO TABS: 14.0000 mg | ORAL_TABLET | Freq: Every day | ORAL | Status: DC

## 2022-10-29 MED ORDER — INFLUENZA VAC SPLIT QUAD 0.5 ML IM SUSY
0.5000 mL | PREFILLED_SYRINGE | INTRAMUSCULAR | Status: AC
  Administered 2022-10-30: 0.5 mL via INTRAMUSCULAR
  Filled 2022-10-29: qty 0.5

## 2022-10-29 MED ORDER — SACCHAROMYCES BOULARDII 250 MG PO CAPS
250.0000 mg | ORAL_CAPSULE | Freq: Two times a day (BID) | ORAL | Status: DC
  Administered 2022-10-29 – 2022-10-31 (×5): 250 mg via ORAL
  Filled 2022-10-29 (×6): qty 1

## 2022-10-29 MED ORDER — PNEUMOCOCCAL 20-VAL CONJ VACC 0.5 ML IM SUSY
0.5000 mL | PREFILLED_SYRINGE | INTRAMUSCULAR | Status: AC
  Administered 2022-10-30: 0.5 mL via INTRAMUSCULAR
  Filled 2022-10-29: qty 0.5

## 2022-10-29 MED ORDER — LOPERAMIDE HCL 2 MG PO CAPS: 4.0000 mg | ORAL_CAPSULE | Freq: Three times a day (TID) | ORAL | Status: DC | PRN

## 2022-10-29 NOTE — Plan of Care (Signed)
  Problem: Education: Goal: Ability to describe self-care measures that may prevent or decrease complications (Diabetes Survival Skills Education) will improve Outcome: Progressing Goal: Individualized Educational Video(s) Outcome: Progressing   Problem: Coping: Goal: Ability to adjust to condition or change in health will improve Outcome: Progressing   Problem: Fluid Volume: Goal: Ability to maintain a balanced intake and output will improve Outcome: Progressing   Problem: Health Behavior/Discharge Planning: Goal: Ability to identify and utilize available resources and services will improve Outcome: Progressing Goal: Ability to manage health-related needs will improve Outcome: Progressing   Problem: Metabolic: Goal: Ability to maintain appropriate glucose levels will improve Outcome: Progressing   Problem: Nutritional: Goal: Maintenance of adequate nutrition will improve Outcome: Progressing Goal: Progress toward achieving an optimal weight will improve Outcome: Progressing   Problem: Skin Integrity: Goal: Risk for impaired skin integrity will decrease Outcome: Progressing   Problem: Tissue Perfusion: Goal: Adequacy of tissue perfusion will improve Outcome: Progressing   Problem: Education: Goal: Ability to describe self-care measures that may prevent or decrease complications (Diabetes Survival Skills Education) will improve Outcome: Progressing Goal: Individualized Educational Video(s) Outcome: Progressing   Problem: Cardiac: Goal: Ability to maintain an adequate cardiac output will improve Outcome: Progressing   Problem: Health Behavior/Discharge Planning: Goal: Ability to identify and utilize available resources and services will improve Outcome: Progressing Goal: Ability to manage health-related needs will improve Outcome: Progressing   Problem: Fluid Volume: Goal: Ability to achieve a balanced intake and output will improve Outcome: Progressing    Problem: Metabolic: Goal: Ability to maintain appropriate glucose levels will improve Outcome: Progressing   Problem: Nutritional: Goal: Maintenance of adequate nutrition will improve Outcome: Progressing Goal: Maintenance of adequate weight for body size and type will improve Outcome: Progressing   Problem: Respiratory: Goal: Will regain and/or maintain adequate ventilation Outcome: Progressing   Problem: Urinary Elimination: Goal: Ability to achieve and maintain adequate renal perfusion and functioning will improve Outcome: Progressing   Problem: Safety: Goal: Non-violent Restraint(s) Outcome: Progressing   Problem: Education: Goal: Knowledge of General Education information will improve Description: Including pain rating scale, medication(s)/side effects and non-pharmacologic comfort measures Outcome: Progressing   Problem: Health Behavior/Discharge Planning: Goal: Ability to manage health-related needs will improve Outcome: Progressing   Problem: Clinical Measurements: Goal: Ability to maintain clinical measurements within normal limits will improve Outcome: Progressing Goal: Will remain free from infection Outcome: Progressing Goal: Diagnostic test results will improve Outcome: Progressing Goal: Respiratory complications will improve Outcome: Progressing Goal: Cardiovascular complication will be avoided Outcome: Progressing   Problem: Activity: Goal: Risk for activity intolerance will decrease Outcome: Progressing   Problem: Nutrition: Goal: Adequate nutrition will be maintained Outcome: Progressing   Problem: Coping: Goal: Level of anxiety will decrease Outcome: Progressing   Problem: Elimination: Goal: Will not experience complications related to bowel motility Outcome: Progressing Goal: Will not experience complications related to urinary retention Outcome: Progressing   Problem: Pain Managment: Goal: General experience of comfort will  improve Outcome: Progressing

## 2022-10-29 NOTE — Progress Notes (Signed)
Occupational Therapy Treatment Patient Details Name: [name] MRN: [id number] DOB: 1971-07-11 Today's Date: 10/29/2022   History of present illness Patient is a 52 y/o male with PMH DM, MS, HTN and prior L foot TMA.  Presented with seizure and found to be in DKA with CBG 580.  Was intubated 1/31-2/01/24.   OT comments  Patient continues to make steady progress towards goals in skilled OT session. Patient's session encompassed   upper level cognitive tasks, however quickly aborted due to aggressive behavior/frustration, with patient able to redirect to ADLs and functional mobility with poor sequencing and safety awareness throughout  (see cognitive portion of note for full details). Patient min A for ADLs and functional mobility during session. OT continues to recommending AIR placement due to current level of functional deficits.    Recommendations for follow up therapy are one component of a multi-disciplinary discharge planning process, led by the attending physician.  Recommendations may be updated based on patient status, additional functional criteria and insurance authorization.    Follow Up Recommendations  Acute inpatient rehab (3hours/day)     Assistance Recommended at Discharge Intermittent Supervision/Assistance  Patient can return home with the following  A little help with walking and/or transfers;A little help with bathing/dressing/bathroom;Assistance with cooking/housework;Direct supervision/assist for medications management;Direct supervision/assist for financial management;Assist for transportation;Help with stairs or ramp for entrance   Equipment Recommendations  BSC/3in1    Recommendations for Other Services      Precautions / Restrictions Precautions Precautions: Fall Restrictions Weight Bearing Restrictions: No       Mobility Bed Mobility               General bed mobility comments: up in recliner upon OT entry    Transfers Overall transfer  level: Needs assistance Equipment used: None Transfers: Sit to/from Stand Sit to Stand: Min assist           General transfer comment: min A solely due to patient's impulsivity with movement and x2 LOB to the R with patient able to self correct `     Balance Overall balance assessment: Needs assistance Sitting-balance support: Feet supported Sitting balance-Leahy Scale: Good     Standing balance support: No upper extremity supported, During functional activity Standing balance-Leahy Scale: Fair                             ADL either performed or assessed with clinical judgement   ADL Overall ADL's : Needs assistance/impaired Eating/Feeding: Set up;Sitting   Grooming: Wash/dry hands;Wash/dry face;Oral care;Minimal assistance Grooming Details (indicate cue type and reason): assist to locate items on L                 Toilet Transfer: Minimal assistance;Ambulation           Functional mobility during ADLs: Minimal assistance;Cueing for safety;Cueing for sequencing General ADL Comments: Session focus on upper level cognitive tasks, quickly aborted due to aggressive behavior, with patient able to redirect to ADLs and functional mobility with poor sequencing and safety awareness throughout.    Extremity/Trunk Assessment              Vision       Perception     Praxis      Cognition Arousal/Alertness: Awake/alert Behavior During Therapy: Impulsive, Flat affect, Restless, Agitated Overall Cognitive Status: Impaired/Different from baseline Area of Impairment: Safety/judgement, Awareness  Current Attention Level: Selective   Following Commands: Follows one step commands with increased time Safety/Judgement: Decreased awareness of safety, Decreased awareness of deficits Awareness: Emergent Problem Solving: Slow processing, Difficulty sequencing, Requires verbal cues General Comments: Attempted to complete medi-cog with  patient, starting with 3 word recall, with patient attempting to repeat back too fast and in the wrong order. When asked to clarify, patient shutting down and becoming more verbally aggressive. Patient easily redirected with ADL task, launching himself out of recliner requiring increased cues for pacing. Patient then could not locate toothbrush in small dish on L, then finding toothbrush and searching frantically for toothpaste which was placed directly next to toothbrush. Patient then muttering and mumbling when completing functional mobility task.        Exercises      Shoulder Instructions       General Comments      Pertinent Vitals/ Pain       Pain Assessment Pain Assessment: No/denies pain  Home Living                                          Prior Functioning/Environment              Frequency  Min 2X/week        Progress Toward Goals  OT Goals(current goals can now be found in the care plan section)  Progress towards OT goals: Progressing toward goals  Acute Rehab OT Goals Patient Stated Goal: to get all of this sorted out OT Goal Formulation: With patient Time For Goal Achievement: 11/08/22 Potential to Achieve Goals: Good  Plan Discharge plan remains appropriate    Co-evaluation                 AM-PAC OT "6 Clicks" Daily Activity     Outcome Measure   Help from another person eating meals?: None Help from another person taking care of personal grooming?: A Little Help from another person toileting, which includes using toliet, bedpan, or urinal?: A Little Help from another person bathing (including washing, rinsing, drying)?: A Little Help from another person to put on and taking off regular upper body clothing?: A Little Help from another person to put on and taking off regular lower body clothing?: A Little 6 Click Score: 19    End of Session Equipment Utilized During Treatment: Gait belt  OT Visit Diagnosis:  Unsteadiness on feet (R26.81);Other abnormalities of gait and mobility (R26.89);Muscle weakness (generalized) (M62.81);History of falling (Z91.81);Other symptoms and signs involving cognitive function;Low vision, both eyes (H54.2)   Activity Tolerance Treatment limited secondary to agitation   Patient Left in chair;with call bell/phone within reach;with chair alarm set;with family/visitor present   Nurse Communication Mobility status        Time: 3762-8315 OT Time Calculation (min): 14 min  Charges: OT General Charges $OT Visit: 1 Visit OT Treatments $Self Care/Home Management : 8-22 mins  Corinne Ports E. Nakkia Mackiewicz, OTR/L Acute Rehabilitation Services [phone]   Ascencion Dike 10/29/2022, 3:19 PM

## 2022-10-29 NOTE — Progress Notes (Signed)
[facility] PROGRESS NOTE   [name] LPF:[id number] DOB: 29-Jul-1971 DOA: 10/23/2022  PCP: [name], No Pcp Per  Brief History/Interval Summary: 52 year old male with PMH which is significant for DM, MS on teriflunomide, and HTN who presented to [facility] ED 1/29 with complaint of seizure.  [name] was transferred to [facility].  He had to be emergently intubated.  Neurology was consulted.  [name] underwent LP.  Stabilized and then extubated.  And then transferred to medical service.  Consultants: Neurology.  Critical care medicine.  Procedures: EEG.  Lumbar puncture.    Subjective/Interval History: [name] denies any new complaints.  Continues to feel weak but better than how he was last week.     Assessment/Plan:  New onset seizures/acute encephalopathy Thought to be secondary to hyperglycemia in the setting of chronic bilateral occipital strokes.  Neurology was consulted.  [name] was started on Keppra which has been continued. Continue with seizure precautions. Initially there was suspicion for meningitis due to fever.  Lumbar puncture did not suggest the same. Continue with Keppra.  History of relapsing remitting MS/chronic bilateral occipital strokes Stable for the most part from neurological standpoint.  Continue with aspirin. Followed by Dr. Epimenio Foot with [facility].  Teriflunomide was on hold.  Discussed with neurology.  Okay to resume.    Spinal stenosis C4-C6 Stable from a neurological standpoint.  Physical therapy.  Acute respiratory failure with hypoxia/concern for evolving pneumonia [name] had high oxygen requirements initially but he was weaned off.  Currently on room air.  Has been on Unasyn.  Since his mentation is stable and his respiratory status is improved we can transition him to Augmentin for 2 more days. Respiratory status is stable.  Acute kidney injury Resolved.  Monitor urine output.     Hypokalemia/hypomagnesemia Repleted.    Diabetes mellitus type 2 uncontrolled with hyperglycemia HbA1c 11.5.  Noted to be on level.  SSI.  CBGs are reasonably well-controlled.    Diabetic foot ulcer Local wound care  Obesity Estimated body mass index is 32.96 kg/m as calculated from the following:   Height as of an earlier encounter on 10/23/22: 5\' 11"  (1.803 m).   Weight as of this encounter: 107.2 kg.  DVT Prophylaxis: Subcutaneous heparin Code Status: Full code Family Communication: Discussed with [name].  No family at bedside Disposition Plan: Await transfer to CIR.  Status is: Inpatient Remains inpatient appropriate because: Seizures      Medications: Scheduled:  aspirin  81 mg Oral Daily   brimonidine  1 drop Both Eyes BID   Chlorhexidine Gluconate Cloth  6 each Topical Daily   finasteride  5 mg Oral Daily   heparin  5,000 Units Subcutaneous Q8H   insulin aspart  0-15 Units Subcutaneous TID WC   insulin detemir  30 Units Subcutaneous Daily   levETIRAcetam  500 mg Oral BID   melatonin  3 mg Oral QHS   nutrition supplement (JUVEN)  1 packet Oral BID BM   saccharomyces boulardii  250 mg Oral BID   Teriflunomide  14 mg Oral Daily   Continuous:  sodium chloride Stopped (10/24/22 1208)   sodium chloride     ampicillin-sulbactam (UNASYN) IV 3 g (10/29/22 0845)   12/28/22 chloride, acetaminophen, benzonatate, dextrose, loperamide, midazolam, mouth rinse  Antibiotics: Anti-infectives (From admission, onward)    Start     Dose/Rate Route Frequency Ordered Stop   10/26/22 0900  Ampicillin-Sulbactam (UNASYN) 3 g in sodium chloride 0.9 % 100 mL IVPB  3 g 200 mL/hr over 30 Minutes Intravenous Every 6 hours 10/26/22 0803 10/31/22 0859   10/25/22 0259  cefTRIAXone (ROCEPHIN) 2 g in sodium chloride 0.9 % 100 mL IVPB  Status:  Discontinued        2 g 200 mL/hr over 30 Minutes Intravenous Every 24 hours 10/24/22 0904 10/24/22 1238   10/24/22 1000  acyclovir  (ZOVIRAX) 900 mg in dextrose 5 % 250 mL IVPB  Status:  Discontinued        900 mg 268 mL/hr over 60 Minutes Intravenous Every 8 hours 10/24/22 0903 10/24/22 1142   10/23/22 1800  vancomycin (VANCOREADY) IVPB 1500 mg/300 mL  Status:  Discontinued        1,500 mg 150 mL/hr over 120 Minutes Intravenous Every 12 hours 10/23/22 0651 10/23/22 1036   10/23/22 1600  acyclovir (ZOVIRAX) 1,000 mg in dextrose 5 % 250 mL IVPB  Status:  Discontinued        1,000 mg 270 mL/hr over 60 Minutes Intravenous Every 8 hours 10/23/22 1036 10/23/22 2230   10/23/22 1500  cefTRIAXone (ROCEPHIN) 2 g in sodium chloride 0.9 % 100 mL IVPB  Status:  Discontinued        2 g 200 mL/hr over 30 Minutes Intravenous Every 12 hours 10/23/22 1017 10/24/22 0904   10/23/22 1200  cefTRIAXone (ROCEPHIN) 2 g in sodium chloride 0.9 % 100 mL IVPB  Status:  Discontinued        2 g 200 mL/hr over 30 Minutes Intravenous 2 times daily 10/23/22 0647 10/23/22 1017   10/23/22 1130  vancomycin (VANCOCIN) IVPB 1000 mg/200 mL premix  Status:  Discontinued        1,000 mg 200 mL/hr over 60 Minutes Intravenous Every 8 hours 10/23/22 1036 10/24/22 0904   10/23/22 0800  ampicillin (OMNIPEN) 2 g in sodium chloride 0.9 % 100 mL IVPB  Status:  Discontinued        2 g 300 mL/hr over 20 Minutes Intravenous Every 4 hours 10/23/22 0647 10/24/22 0903   10/23/22 0800  acyclovir (ZOVIRAX) 850 mg in dextrose 5 % 250 mL IVPB  Status:  Discontinued        850 mg 267 mL/hr over 60 Minutes Intravenous Every 8 hours 10/23/22 0648 10/23/22 1036       Objective:  Vital Signs  Vitals:   10/29/22 0324 10/29/22 0327 10/29/22 0341 10/29/22 0748  BP:  (!) 95/40 119/73 133/85  Pulse:  89  87  Resp:  14  18  Temp:  99.1 F (37.3 C)  98.5 F (36.9 C)  TempSrc:  Oral  Oral  SpO2:  94%  95%  Weight: 107.2 kg       Intake/Output Summary (Last 24 hours) at 10/29/2022 5993 Last data filed at 10/29/2022 0603 Gross per 24 hour  Intake 100 ml  Output 2150 ml   Net -2050 ml    Filed Weights   10/25/22 0500 10/28/22 0500 10/29/22 0324  Weight: 114.9 kg 109 kg 107.2 kg    General appearance: Awake alert.  In no distress Resp: Clear to auscultation bilaterally.  Normal effort Cardio: S1-S2 is normal regular.  No S3-S4.  No rubs murmurs or bruit GI: Abdomen is soft.  Nontender nondistended.  Bowel sounds are present normal.  No masses organomegaly Extremities: No edema.    Lab Results:  Data Reviewed: I have personally reviewed following labs and reports of the imaging studies  CBC: Recent Labs  Lab 10/23/22 0017 10/23/22 0311 10/23/22  3559 10/23/22 0847 10/24/22 0454 10/25/22 2224 10/26/22 0419 10/27/22 0319 10/28/22 0244  WBC 8.1 9.9  --  10.7* 10.0  --  9.8 8.5 6.8  NEUTROABS 4.3 9.1*  --   --   --   --   --   --   --   HGB 16.7 14.3   < > 14.3 13.1 13.3 13.2 13.3 12.9*  HCT 50.4 41.1   < > 41.0 37.9* 39.0 38.3* 37.9* 37.9*  MCV 89.4 86.7  --  87.6 87.3  --  88.2 86.3 88.1  PLT 323 178  --  238 179  --  186 179 213   < > = values in this interval not displayed.     Basic Metabolic Panel: Recent Labs  Lab 10/24/22 0454 10/24/22 1647 10/25/22 0423 10/25/22 1741 10/25/22 1947 10/25/22 2224 10/26/22 0419 10/27/22 0319 10/28/22 0244  NA 137  --  135  --  138 140 139 135 134*  K 3.3*  --  3.5  --  3.2* 3.2* 3.7 3.1* 3.7  CL 106  --  104  --  103  --  103 99 97*  CO2 21*  --  24  --  26  --  27 27 26   GLUCOSE 184*  --  265*  --  118*  --  104* 126* 90  BUN 10  --  15  --  13  --  14 14 11   CREATININE 1.09  --  1.00  --  0.92  --  1.04 0.82 0.97  CALCIUM 8.6*  --  8.2*  --  8.4*  --  8.4* 8.7* 8.7*  MG 1.6* 1.8 1.6* 9.2* 2.0  --  1.7 1.6* 1.8  PHOS 2.0* 3.8 3.1 2.5  --   --   --   --   --      GFR: Estimated Creatinine Clearance: 111 mL/min (by C-G formula based on SCr of 0.97 mg/dL).  Liver Function Tests: Recent Labs  Lab 10/23/22 0010  AST 36  ALT 15  ALKPHOS 121  BILITOT 0.7  PROT 8.5*  ALBUMIN 3.8      Coagulation Profile: Recent Labs  Lab 10/23/22 0010  INR 1.2     Cardiac Enzymes: Recent Labs  Lab 10/23/22 0010  CKTOTAL 72      CBG: Recent Labs  Lab 10/28/22 0336 10/28/22 0718 10/28/22 1244 10/28/22 2126 10/29/22 0630  GLUCAP 97 116* 135* 178* 163*      Recent Results (from the past 240 hour(s))  Culture, blood (routine x 2)     Status: None   Collection Time: 10/23/22 12:17 AM   Specimen: BLOOD RIGHT ARM  Result Value Ref Range Status   Specimen Description BLOOD RIGHT ARM  Final   Special Requests   Final    BOTTLES DRAWN AEROBIC AND ANAEROBIC Blood Culture adequate volume   Culture   Final    NO GROWTH 5 DAYS Performed at [facility], [address] [place],  [address]    Report Status 10/28/2022 FINAL  Final  Culture, blood (routine x 2)     Status: None   Collection Time: 10/23/22 12:17 AM   Specimen: BLOOD RIGHT ARM  Result Value Ref Range Status   Specimen Description BLOOD RIGHT ARM  Final   Special Requests   Final    BOTTLES DRAWN AEROBIC AND ANAEROBIC Blood Culture results may not be optimal due to an inadequate volume of blood received in culture bottles  Culture   Final    NO GROWTH 5 DAYS Performed at [facility], [address] [place], [place] [address]    Report Status 10/28/2022 FINAL  Final  Resp panel by RT-PCR (RSV, Flu A&B, Covid) Anterior Nasal Swab     Status: None   Collection Time: 10/23/22 12:17 AM   Specimen: Anterior Nasal Swab  Result Value Ref Range Status   SARS Coronavirus 2 by RT PCR NEGATIVE NEGATIVE Final    Comment: (NOTE) SARS-CoV-2 target nucleic acids are NOT DETECTED.  The SARS-CoV-2 RNA is generally detectable in upper respiratory specimens during the acute phase of infection. The lowest concentration of SARS-CoV-2 viral copies this assay can detect is 138 copies/mL. A negative result does not preclude SARS-Cov-2 infection and should not be used as the sole basis  for treatment or other [name] management decisions. A negative result may occur with  improper specimen collection/handling, submission of specimen other than nasopharyngeal swab, presence of viral mutation(s) within the areas targeted by this assay, and inadequate number of viral copies(<138 copies/mL). A negative result must be combined with clinical observations, [name] history, and epidemiological information. The expected result is Negative.  Fact Sheet for Patients:  BloggerCourse.com  Fact Sheet for Healthcare Providers:  SeriousBroker.it  This test is no t yet approved or cleared by the [place] FDA and  has been authorized for detection and/or diagnosis of SARS-CoV-2 by FDA under an Emergency Use Authorization (EUA). This EUA will remain  in effect (meaning this test can be used) for the duration of the COVID-19 declaration under Section 564(b)(1) of the Act, 21 U.S.C.section 360bbb-3(b)(1), unless the authorization is terminated  or revoked sooner.       Influenza A by PCR NEGATIVE NEGATIVE Final   Influenza B by PCR NEGATIVE NEGATIVE Final    Comment: (NOTE) The Xpert Xpress SARS-CoV-2/FLU/RSV plus assay is intended as an aid in the diagnosis of influenza from Nasopharyngeal swab specimens and should not be used as a sole basis for treatment. Nasal washings and aspirates are unacceptable for Xpert Xpress SARS-CoV-2/FLU/RSV testing.  Fact Sheet for Patients: BloggerCourse.com  Fact Sheet for Healthcare Providers: SeriousBroker.it  This test is not yet approved or cleared by the [place] FDA and has been authorized for detection and/or diagnosis of SARS-CoV-2 by FDA under an Emergency Use Authorization (EUA). This EUA will remain in effect (meaning this test can be used) for the duration of the COVID-19 declaration under Section 564(b)(1) of the Act, 21  U.S.C. section 360bbb-3(b)(1), unless the authorization is terminated or revoked.     Resp Syncytial Virus by PCR NEGATIVE NEGATIVE Final    Comment: (NOTE) Fact Sheet for Patients: BloggerCourse.com  Fact Sheet for Healthcare Providers: SeriousBroker.it  This test is not yet approved or cleared by the [place] FDA and has been authorized for detection and/or diagnosis of SARS-CoV-2 by FDA under an Emergency Use Authorization (EUA). This EUA will remain in effect (meaning this test can be used) for the duration of the COVID-19 declaration under Section 564(b)(1) of the Act, 21 U.S.C. section 360bbb-3(b)(1), unless the authorization is terminated or revoked.  Performed at [facility], [address] [place], [place] [address]   Culture, Urine (Do not remove urinary catheter, catheter placed by urology or difficult to place)     Status: None   Collection Time: 10/23/22 12:17 AM   Specimen: Urine, Catheterized  Result Value Ref Range Status   Specimen Description   Final  URINE, CATHETERIZED Performed at [facility], [address], [place], [place] [address]    Special Requests   Final    NONE Performed at [facility], [address] [place], [place] [address]    Culture   Final    NO GROWTH Performed at [facility], [place] [address], [place], [place] [address]    Report Status 10/24/2022 FINAL  Final  Aerobic/Anaerobic Culture w Gram Stain (surgical/deep wound)     Status: None   Collection Time: 10/23/22  1:03 AM   Specimen: Foot  Result Value Ref Range Status   Specimen Description   Final    FOOT LEFT Performed at [facility], [address] [place], [place] [address]    Special Requests   Final    NONE Performed at [facility], [address] [place], [place] [address]    Gram Stain   Final    RARE WBC PRESENT, PREDOMINANTLY PMN MODERATE  GRAM POSITIVE COCCI RARE BUDDING YEAST SEEN    Culture   Final    RARE PROTEUS MIRABILIS ABUNDANT STAPHYLOCOCCUS AUREUS ABUNDANT STREPTOCOCCUS AGALACTIAE TESTING AGAINST S. AGALACTIAE NOT ROUTINELY PERFORMED DUE TO PREDICTABILITY OF AMP/PEN/VAN SUSCEPTIBILITY. NO ANAEROBES ISOLATED Performed at [facility], [place] [address] [place] [address] [place], [place] [address]    Report Status 10/28/2022 FINAL  Final   Organism ID, Bacteria PROTEUS MIRABILIS  Final   Organism ID, Bacteria STAPHYLOCOCCUS AUREUS  Final      Susceptibility   Proteus mirabilis - MIC*    AMPICILLIN <=2 SENSITIVE Sensitive     CEFAZOLIN <=4 SENSITIVE Sensitive     CEFEPIME <=0.12 SENSITIVE Sensitive     CEFTAZIDIME <=1 SENSITIVE Sensitive     CEFTRIAXONE <=0.25 SENSITIVE Sensitive     CIPROFLOXACIN <=0.25 SENSITIVE Sensitive     GENTAMICIN >=16 RESISTANT Resistant     IMIPENEM 2 SENSITIVE Sensitive     TRIMETH/SULFA >=320 RESISTANT Resistant     AMPICILLIN/SULBACTAM <=2 SENSITIVE Sensitive     PIP/TAZO <=4 SENSITIVE Sensitive     * RARE PROTEUS MIRABILIS   Staphylococcus aureus - MIC*    CIPROFLOXACIN >=8 RESISTANT Resistant     ERYTHROMYCIN >=8 RESISTANT Resistant     GENTAMICIN <=0.5 SENSITIVE Sensitive     OXACILLIN >=4 RESISTANT Resistant     TETRACYCLINE <=1 SENSITIVE Sensitive     VANCOMYCIN <=0.5 SENSITIVE Sensitive     TRIMETH/SULFA 160 RESISTANT Resistant     CLINDAMYCIN >=8 RESISTANT Resistant     RIFAMPIN <=0.5 SENSITIVE Sensitive     Inducible Clindamycin NEGATIVE Sensitive     * ABUNDANT STAPHYLOCOCCUS AUREUS  MRSA Next Gen by PCR, Nasal     Status: None   Collection Time: 10/23/22  9:39 AM   Specimen: Nasal Mucosa; Nasal Swab  Result Value Ref Range Status   MRSA by PCR Next Gen NOT DETECTED NOT DETECTED Final    Comment: (NOTE) The GeneXpert MRSA Assay (FDA approved for NASAL specimens only), is one component of a comprehensive MRSA colonization surveillance program. It is not intended to diagnose  MRSA infection nor to guide or monitor treatment for MRSA infections. Test performance is not FDA approved in patients less than 66 years old. Performed at [facility], [place] [address] [place],  [address]   CSF culture w Gram Stain     Status: None   Collection Time: 10/23/22  1:36 PM   Specimen: PATH Cytology CSF; Cerebrospinal Fluid  Result Value Ref  Range Status   Specimen Description CSF  Final   Special Requests NONE  Final   Gram Stain   Final    WBC PRESENT,BOTH PMN AND MONONUCLEAR NO ORGANISMS SEEN CYTOSPIN SMEAR    Culture   Final    NO GROWTH Performed at [facility], [address] [place], [place] [address]    Report Status 10/26/2022 FINAL  Final  Culture, fungus without smear     Status: None (Preliminary result)   Collection Time: 10/23/22  1:36 PM   Specimen: PATH Cytology CSF; Cerebrospinal Fluid  Result Value Ref Range Status   Specimen Description CSF  Final   Special Requests NONE  Final   Culture   Final    NO FUNGUS ISOLATED AFTER 5 DAYS Performed at [facility], [place] [address] [place], [place] [address]    Report Status PENDING  Incomplete  Anaerobic culture w Gram Stain     Status: None   Collection Time: 10/23/22  1:36 PM   Specimen: PATH Cytology CSF; Cerebrospinal Fluid  Result Value Ref Range Status   Specimen Description CSF  Final   Special Requests NONE  Final   Gram Stain   Final    WBC PRESENT,BOTH PMN AND MONONUCLEAR NO ORGANISMS SEEN CYTOSPIN SMEAR    Culture   Final    NO ANAEROBES ISOLATED Performed at [facility], [place] [address], [place], [place] [address]    Report Status 10/28/2022 FINAL  Final      Radiology Studies: No results found.     LOS: 6 days   Mcclellan Demarais [employer] on [email or web address]  10/29/2022, 9:38 AM

## 2022-10-29 NOTE — Progress Notes (Signed)
Mobility Specialist: Progress Note   10/29/22 1053  Mobility  Activity Ambulated with assistance in hallway  Level of Assistance Minimal assist, patient does 75% or more  Assistive Device None  Distance Ambulated (ft) 300 ft (50'+150'+100)  Activity Response Tolerated well  Mobility Referral Yes  $Mobility charge 1 Mobility   Pt received in the chair and agreeable to mobility. Intermittent minA during ambulation for balance requiring x2 standing breaks to cue pt to slow down. No c/o throughout. Pt back to the chair after session with call bell and phone in reach. Chair alarm is on.   [place] Draya Felker Mobility Specialist Please contact via SecureChat or Rehab office at [phone]

## 2022-10-29 NOTE — TOC Initial Note (Signed)
Transition of Care [facility] - Initial/Assessment Note    Patient Details  Name: [name] MRN: [id number] Date of Birth: Mar 04, 1971  Transition of Care [facility]) CM/SW Contact:    [name] Friar, RN Phone Number: 10/29/2022, 2:54 PM  Clinical Narrative:                 Pt lives at home alone. His daughter and a friend check on him .  No DME at home.  Pt uses friends for transportation to appts, etc.  Pt manages his own medications and denies any issues.  Pt with recommendations for IR. Pt's insurance is not in network with [facility]. CM met with him and provided him the IR facilities in network: [facility] and [facility]. Pt prefers [facility]. CM has faxed the information to [facility] with pt permission. Pt also gave CM permission to reach out to his daughter. CM has left her a message as she is at work. TOC following.    Expected Discharge Plan: IP Rehab Facility Barriers to Discharge: Continued Medical Work up   Patient Goals and CMS Choice   CMS Medicare.gov Compare Post Acute Care list provided to:: Patient Choice offered to / list presented to : Patient      Expected Discharge Plan and Services   Discharge Planning Services: CM Consult Post Acute Care Choice: IP Rehab Living arrangements for the past 2 months: Apartment                                      Prior Living Arrangements/Services Living arrangements for the past 2 months: Apartment Lives with:: Self Patient language and need for interpreter reviewed:: Yes Do you feel safe going back to the place where you live?: Yes        Care giver support system in place?: No (comment)   Criminal Activity/Legal Involvement Pertinent to Current Situation/Hospitalization: No - Comment as needed  Activities of Daily Living      Permission Sought/Granted                  Emotional Assessment Appearance:: Appears stated age Attitude/Demeanor/Rapport: Engaged Affect (typically observed): Accepting Orientation: :  Oriented to Self, Oriented to Place, Oriented to  Time, Oriented to Situation   Psych Involvement: No (comment)  Admission diagnosis:  Status epilepticus ([place]) [G40.901] Patient Active Problem List   Diagnosis Date Noted   Encephalopathy acute 10/24/2022   Seizure ([place]) 10/24/2022   Pressure injury of skin 10/24/2022   Status epilepticus ([place]) 10/23/2022   Acute respiratory failure with hypoxia ([place]) 10/23/2022   Diabetic ketoacidosis with coma associated with type 2 diabetes mellitus ([employer]) 10/23/2022   AKI (acute kidney injury) ([place]) 10/23/2022   Lactic acidosis 10/23/2022   Muscle spasm 06/18/2022   Vitamin D deficiency 07/26/2020   Other fatigue 07/26/2020   Urinary urgency 07/26/2020   Multiple sclerosis ([place]) 09/10/2018   Ataxic gait 09/10/2018   High risk medication use 09/10/2018   Osteomyelitis of ankle or foot, acute ([place]) 10/01/2017   Varicose veins with pain 07/06/2017   Diabetes mellitus type 2 in obese ([place]) 03/13/2017   Peripheral artery disease ([place]) 03/13/2017   Chronic venous insufficiency 03/13/2017   Hyperlipidemia 03/13/2017   Essential hypertension 03/13/2017   PCP:  Patient, No Pcp Per Pharmacy:   [facility] [place], [place]  [place] [address] Phone: [phone] Fax: [phone]  [facility] [place], [place] [address] [place] [address] Phone: [phone] Fax: [phone]  [facility] [place], [place] [address] [place] [address] Phone: [phone] Fax: [phone]  [facility] [place], [place] - [address] [place] [address] Phone: [phone] Fax: [phone]     Social Determinants of Health (SDOH) Social  History: SDOH Screenings   Tobacco Use: Low Risk  (10/23/2022)   SDOH Interventions:     Readmission Risk Interventions     No data to display

## 2022-10-29 NOTE — Progress Notes (Signed)
Physical Therapy Treatment Patient Details Name: [name] MRN: [id number] DOB: 08/12/1971 Today's Date: 10/29/2022   History of Present Illness Patient is a 52 y/o male with PMH DM, MS, HTN and prior L foot TMA.  Presented with seizure and found to be in DKA with CBG 580.  Was intubated 1/31-2/01/24.    PT Comments    Pt progressing towards his physical therapy goals. Session focused on static/dynamic balance training, progressive ambulation and stair training. Pt ambulating limited hallway distances with no assistive device at a min assist level. Scoring 24 on the [facility] Scale, indicating he is at high risk for falls. Continue to recommend AIR to address deficits and maximize functional independence, with goal to progress to modI for mobility/ADL's with intermittent supervision for IADL's.   Recommendations for follow up therapy are one component of a multi-disciplinary discharge planning process, led by the attending physician.  Recommendations may be updated based on patient status, additional functional criteria and insurance authorization.  Follow Up Recommendations  Acute inpatient rehab (3hours/day)     Assistance Recommended at Discharge Intermittent Supervision/Assistance  Patient can return home with the following Assistance with cooking/housework;Direct supervision/assist for medications management;Assist for transportation;Help with stairs or ramp for entrance;A little help with walking and/or transfers;A little help with bathing/dressing/bathroom   Equipment Recommendations  Rolling walker (2 wheels)    Recommendations for Other Services       Precautions / Restrictions Precautions Precautions: Fall Restrictions Weight Bearing Restrictions: No     Mobility  Bed Mobility Overal bed mobility: Needs Assistance Bed Mobility: Supine to Sit     Supine to sit: Supervision     General bed mobility comments: no physical assist required     Transfers Overall transfer level: Needs assistance Equipment used: Rolling walker (2 wheels) Transfers: Sit to/from Stand Sit to Stand: Min assist Stand pivot transfers: Min guard         General transfer comment: MinA to rise, + use of hands, has to use momentum    Ambulation/Gait Ambulation/Gait assistance: Min assist Gait Distance (Feet): 180 Feet Assistive device: None Gait Pattern/deviations: Step-through pattern, Decreased stride length, Wide base of support, Shuffle Gait velocity: decreased     General Gait Details: Consistent minA for balance, drifting towards right, high cadence and decreased stride length. Wider BOS.   Stairs Stairs: Yes Stairs assistance: Min assist Stair Management: One rail Left, Two rails Number of Stairs: 4 General stair comments: step by step, safety cues   Wheelchair Mobility    Modified Rankin (Stroke Patients Only)       Balance Overall balance assessment: Needs assistance Sitting-balance support: Feet supported Sitting balance-Leahy Scale: Good     Standing balance support: No upper extremity supported, During functional activity Standing balance-Leahy Scale: Fair   Single Leg Stance - Right Leg: 0 Single Leg Stance - Left Leg: 0   Tandem Stance - Left Leg: 10 [place] - Eyes Opened: 10       Standardized Balance Assessment Standardized Balance Assessment : Berg Balance Test Berg Balance Test Sit to Stand: Needs minimal aid to stand or to stabilize Standing Unsupported: Able to stand 2 minutes with supervision Sitting with Back Unsupported but Feet Supported on Floor or Stool: Able to sit safely and securely 2 minutes Stand to Sit: Sits independently, has uncontrolled descent Transfers: Able to transfer with verbal cueing and /or supervision Standing Unsupported with Eyes Closed: Able to stand 10 seconds with supervision Standing Ubsupported with Feet Together: Needs help  to attain position but able to stand for  30 seconds with feet together From Standing, Reach Forward with Outstretched Arm: Can reach forward >12 cm safely (5") From Standing Position, Pick up Object from Floor: Unable to pick up and needs supervision From Standing Position, Turn to Look Behind Over each Shoulder: Looks behind one side only/other side shows less weight shift Turn 360 Degrees: Needs close supervision or verbal cueing Standing Unsupported, Alternately Place Feet on Step/Stool: Needs assistance to keep from falling or unable to try Standing Unsupported, One Foot in Front: Needs help to step but can hold 15 seconds Standing on One Leg: Unable to try or needs assist to prevent fall Total Score: 24        Cognition Arousal/Alertness: Awake/alert Behavior During Therapy: Impulsive Overall Cognitive Status: Impaired/Different from baseline Area of Impairment: Safety/judgement, Awareness                   Current Attention Level: Selective   Following Commands: Follows one step commands consistently, Follows multi-step commands consistently, Follows multi-step commands with increased time Safety/Judgement: Decreased awareness of safety   Problem Solving: Slow processing General Comments: When asked how many times he checks his blood sugar a day, "pt stating 1 less than 1." States daughter is going to start a pill box for him        Exercises      General Comments        Pertinent Vitals/Pain Pain Assessment Pain Assessment: No/denies pain    Home Living                          Prior Function            PT Goals (current goals can now be found in the care plan section) Acute Rehab PT Goals Patient Stated Goal: return to independent, improve balance Potential to Achieve Goals: Good Progress towards PT goals: Progressing toward goals    Frequency    Min 3X/week      PT Plan Current plan remains appropriate    Co-evaluation              AM-PAC PT "6 Clicks"  Mobility   Outcome Measure  Help needed turning from your back to your side while in a flat bed without using bedrails?: A Little Help needed moving from lying on your back to sitting on the side of a flat bed without using bedrails?: A Little Help needed moving to and from a bed to a chair (including a wheelchair)?: A Little Help needed standing up from a chair using your arms (e.g., wheelchair or bedside chair)?: A Little Help needed to walk in hospital room?: A Little Help needed climbing 3-5 steps with a railing? : A Little 6 Click Score: 18    End of Session Equipment Utilized During Treatment: Gait belt Activity Tolerance: Patient tolerated treatment well Patient left: in chair;with call bell/phone within reach;with chair alarm set Nurse Communication: Mobility status PT Visit Diagnosis: Other abnormalities of gait and mobility (R26.89);Other symptoms and signs involving the nervous system (R29.898);Muscle weakness (generalized) (M62.81)     Time: 4709-6283 PT Time Calculation (min) (ACUTE ONLY): 29 min  Charges:  $Therapeutic Activity: 23-37 mins                     Wyona Almas, PT, DPT Acute Rehabilitation Services Office [place] 10/29/2022, 9:14 AM

## 2022-10-30 DIAGNOSIS — R569 Unspecified convulsions: Secondary | ICD-10-CM | POA: Diagnosis not present

## 2022-10-30 LAB — GLUCOSE, CAPILLARY
Glucose-Capillary: 174 mg/dL — ABNORMAL HIGH (ref 70–99)
Glucose-Capillary: 214 mg/dL — ABNORMAL HIGH (ref 70–99)
Glucose-Capillary: 189 mg/dL — ABNORMAL HIGH (ref 70–99)
Glucose-Capillary: 208 mg/dL — ABNORMAL HIGH (ref 70–99)
Glucose-Capillary: 278 mg/dL — ABNORMAL HIGH (ref 70–99)

## 2022-10-30 LAB — BASIC METABOLIC PANEL
Anion gap: 8 (ref 5–15)
BUN: 13 mg/dL (ref 6–20)
CO2: 27 mmol/L (ref 22–32)
Calcium: 9 mg/dL (ref 8.9–10.3)
Chloride: 99 mmol/L (ref 98–111)
Creatinine, Ser: 1.06 mg/dL (ref 0.61–1.24)
GFR, Estimated: >60 mL/min (ref >60)
Glucose, Bld: 169 mg/dL — ABNORMAL HIGH (ref 70–99)
Potassium: 4.4 mmol/L (ref 3.5–5.1)
Sodium: 134 mmol/L — ABNORMAL LOW (ref 135–145)

## 2022-10-30 LAB — MAGNESIUM: Magnesium: 1.7 mg/dL (ref 1.7–2.4)

## 2022-10-30 MED ORDER — INSULIN DETEMIR 100 UNIT/ML FLEXPEN: 30.0000 [IU] | PEN_INJECTOR | Freq: Every day | SUBCUTANEOUS | 11 refills | Status: DC

## 2022-10-30 MED ORDER — LEVETIRACETAM 500 MG PO TABS: 500.0000 mg | ORAL_TABLET | Freq: Two times a day (BID) | ORAL | 2 refills | Status: DC

## 2022-10-30 MED ORDER — ASPIRIN 81 MG PO TBEC: 81.0000 mg | DELAYED_RELEASE_TABLET | Freq: Every day | ORAL | 2 refills | Status: AC

## 2022-10-30 MED ORDER — MELATONIN 3 MG PO TABS: 3.0000 mg | ORAL_TABLET | Freq: Every evening | ORAL | 0 refills | Status: AC | PRN

## 2022-10-30 MED ORDER — SACCHAROMYCES BOULARDII 250 MG PO CAPS: 250.0000 mg | ORAL_CAPSULE | Freq: Two times a day (BID) | ORAL | 0 refills | Status: AC

## 2022-10-30 MED ORDER — LOPERAMIDE HCL 2 MG PO CAPS: 4.0000 mg | ORAL_CAPSULE | Freq: Three times a day (TID) | ORAL | 0 refills | Status: AC | PRN

## 2022-10-30 MED ORDER — BENZONATATE 100 MG PO CAPS: 100.0000 mg | ORAL_CAPSULE | Freq: Three times a day (TID) | ORAL | 0 refills | Status: AC | PRN

## 2022-10-30 NOTE — Progress Notes (Addendum)
Physical Therapy Treatment Patient Details Name: [name] MRN: [id number] DOB: [birth year] Today's Date: 10/30/2022   History of Present Illness Patient is a 52 y/o male with PMH DM, MS, HTN and prior L foot TMA.  Presented with seizure and found to be in DKA with CBG 580.  Was intubated 1/31-2/01/24.    PT Comments    Pt with steady progress towards his physical therapy goals, demonstrating improved activity tolerance and balance. Session focused on gait and balance training and standing exercises for BLE strengthening. Pt demonstrates decreased coordination/strength in LLE in comparison to RLE. Gait speed of 1.38 indicative of high fall risk. Recommend AIR to address deficits and maximize functional independence.     Recommendations for follow up therapy are one component of a multi-disciplinary discharge planning process, led by the attending physician.  Recommendations may be updated based on patient status, additional functional criteria and insurance authorization.  Follow Up Recommendations  Acute inpatient rehab (3hours/day)     Assistance Recommended at Discharge Intermittent Supervision/Assistance  Patient can return home with the following Assistance with cooking/housework;Direct supervision/assist for medications management;Assist for transportation;Help with stairs or ramp for entrance;A little help with walking and/or transfers;A little help with bathing/dressing/bathroom   Equipment Recommendations  Rolling walker (2 wheels)    Recommendations for Other Services       Precautions / Restrictions Precautions Precautions: Fall Restrictions Weight Bearing Restrictions: No     Mobility  Bed Mobility Overal bed mobility: Needs Assistance Bed Mobility: Supine to Sit     Supine to sit: Supervision     General bed mobility comments: improved efficiency    Transfers Overall transfer level: Needs assistance Equipment used: None Transfers: Sit to/from  Stand Sit to Stand: Min guard           General transfer comment: No physical assist required    Ambulation/Gait Ambulation/Gait assistance: Min assist Gait Distance (Feet): 200 Feet Assistive device: None Gait Pattern/deviations: Step-through pattern, Decreased stride length, Wide base of support, Shuffle Gait velocity: 1.38 ft/s Gait velocity interpretation: <1.8 ft/sec, indicate of risk for recurrent falls   General Gait Details: VC's for upright posture. Still with high cadence, decreased bilateral foot clearance, and increased anterior weight shift. Improved balance overall. Decreased reciprocal arm swing   Stairs             Wheelchair Mobility    Modified Rankin (Stroke Patients Only)       Balance Overall balance assessment: Needs assistance Sitting-balance support: Feet supported Sitting balance-Leahy Scale: Good     Standing balance support: No upper extremity supported, During functional activity Standing balance-Leahy Scale: Fair                              Cognition Arousal/Alertness: Awake/alert Behavior During Therapy: Impulsive Overall Cognitive Status: Impaired/Different from baseline Area of Impairment: Safety/judgement, Awareness                   Current Attention Level: Selective   Following Commands: Follows one step commands consistently, Follows multi-step commands consistently, Follows multi-step commands with increased time Safety/Judgement: Decreased awareness of safety   Problem Solving: Slow processing General Comments: Pt demonstrating improving awareness, stating he can't get out of bed by himself due to his balance        Exercises General Exercises - Lower Extremity Hip ABduction/ADduction: Both, 5 reps, Standing Mini-Sqauts: Both, 10 reps, Standing Other Exercises Other Exercises: Standing: bilateral  hamstring curls x 10 each Other Exercises: Lateral stepping to R/L x 20 ft in each direction.  bilateral hand support    General Comments        Pertinent Vitals/Pain Pain Assessment Pain Assessment: No/denies pain    Home Living                          Prior Function            PT Goals (current goals can now be found in the care plan section) Acute Rehab PT Goals Patient Stated Goal: return to independent, improve balance Potential to Achieve Goals: Good Progress towards PT goals: Progressing toward goals    Frequency    Min 3X/week      PT Plan Current plan remains appropriate    Co-evaluation              AM-PAC PT "6 Clicks" Mobility   Outcome Measure  Help needed turning from your back to your side while in a flat bed without using bedrails?: A Little Help needed moving from lying on your back to sitting on the side of a flat bed without using bedrails?: A Little Help needed moving to and from a bed to a chair (including a wheelchair)?: A Little Help needed standing up from a chair using your arms (e.g., wheelchair or bedside chair)?: A Little Help needed to walk in hospital room?: A Little Help needed climbing 3-5 steps with a railing? : A Little 6 Click Score: 18    End of Session Equipment Utilized During Treatment: Gait belt Activity Tolerance: Patient tolerated treatment well Patient left: in chair;with call bell/phone within reach;with chair alarm set Nurse Communication: Mobility status PT Visit Diagnosis: Other abnormalities of gait and mobility (R26.89);Other symptoms and signs involving the nervous system (R29.898);Muscle weakness (generalized) (M62.81)     Time: 4098-1191 PT Time Calculation (min) (ACUTE ONLY): 21 min  Charges:  $Gait Training: 8-22 mins                     Wyona Almas, PT, DPT Acute Rehabilitation Services Office [place] 10/30/2022, 8:44 AM

## 2022-10-30 NOTE — Discharge Summary (Signed)
[facility]  Physician Discharge Summary   Patient ID: [name] MRN: [id number] DOB/AGE: May 09, 1971 52 y.o.  Admit date: 10/23/2022 Discharge date:   10/30/2022   PCP: Patient, No Pcp Per  DISCHARGE DIAGNOSES:  Seizure disorder  Status epilepticus (HCC)   Acute respiratory failure with hypoxia (HCC) Diabetes mellitus type II, uncontrolled with hyperglycemia   AKI (acute kidney injury) (HCC)   Lactic acidosis   Encephalopathy acute   Pressure injury of skin   RECOMMENDATIONS FOR OUTPATIENT FOLLOW UP: Please check CBC and basic metabolic panel in 3 to 4 days Patient will need to be seen by Dr. Sherol Dade with the [facility] in 6-8 weeks   Home Health: To inpatient rehabilitation Equipment/Devices: None  CODE STATUS: Full code  DISCHARGE CONDITION: fair  Diet recommendation: Modified carbohydrate  INITIAL HISTORY:  52 year old male with PMH which is significant for DM, MS on teriflunomide, and HTN who presented to [facility] ED 1/29 with complaint of seizure.  Patient was transferred to [facility].  He had to be emergently intubated.  Neurology was consulted.  Patient underwent LP.  Stabilized and then extubated.  And then transferred to medical service.   Consultants: Neurology.  Critical care medicine.   Procedures: EEG.  Lumbar puncture.   HOSPITAL COURSE:   New onset seizures/acute encephalopathy Thought to be secondary to hyperglycemia in the setting of chronic bilateral occipital strokes.  Neurology was consulted.  Patient was started on Keppra which has been continued. Initially there was suspicion for meningitis due to fever.  Lumbar puncture did not suggest the same.  May need to follow-up with neurology in the outpatient setting.   History of relapsing remitting MS/chronic bilateral occipital strokes Stable for the most part from neurological standpoint.  Continue with aspirin. Followed by Dr. Epimenio Foot with [facility].  Teriflunomide was on hold.  This was resumed after discussions with neurology.   Spinal stenosis C4-C6 Stable from a neurological standpoint.  Physical therapy.   Acute respiratory failure with hypoxia/concern for evolving pneumonia Patient had high oxygen requirements initially but he was weaned off.  Currently on room air.  Treated with Unasyn and then Augmentin.  Respiratory status is stable.   Acute kidney injury Resolved.  Monitor urine output.     Hypokalemia/hypomagnesemia Repleted.     Diabetes mellitus type 2 uncontrolled with hyperglycemia HbA1c 11.5.  Noted to be on level.  SSI.  CBGs are reasonably well-controlled.     Diabetic foot ulcer Local wound care   Obesity Estimated body mass index is 32.96 kg/m as calculated from the following:   Height as of an earlier encounter on 10/23/22: 5\' 11"  (1.803 m).   Weight as of this encounter: 107.2 kg.   Stage I sacral decubitus, POA Pressure Injury 10/23/22 Sacrum Bilateral Stage 1 -  Intact skin with non-blanchable redness of a localized area usually over a bony prominence. (Active)  10/23/22 10/25/22  Location: Sacrum  Location Orientation: Bilateral  Staging: Stage 1 -  Intact skin with non-blanchable redness of a localized area usually over a bony prominence.  Wound Description (Comments):   Present on Admission: Yes   Patient is stable.  Okay for discharge to rehabilitation when bed is available.   PERTINENT LABS:  The results of significant diagnostics from this hospitalization (including imaging, microbiology, ancillary and laboratory) are listed below for reference.    Microbiology: Recent Results (from the past 240 hour(s))  Culture, blood (routine x 2)  Status: None   Collection Time: 10/23/22 12:17 AM   Specimen: BLOOD RIGHT ARM  Result Value Ref Range Status   Specimen Description BLOOD RIGHT ARM  Final   Special Requests   Final    BOTTLES DRAWN AEROBIC AND ANAEROBIC Blood  Culture adequate volume   Culture   Final    NO GROWTH 5 DAYS Performed at [facility], [address] [place], [place] [address]    Report Status 10/28/2022 FINAL  Final  Culture, blood (routine x 2)     Status: None   Collection Time: 10/23/22 12:17 AM   Specimen: BLOOD RIGHT ARM  Result Value Ref Range Status   Specimen Description BLOOD RIGHT ARM  Final   Special Requests   Final    BOTTLES DRAWN AEROBIC AND ANAEROBIC Blood Culture results may not be optimal due to an inadequate volume of blood received in culture bottles   Culture   Final    NO GROWTH 5 DAYS Performed at [facility], [address] [place], [place] [address]    Report Status 10/28/2022 FINAL  Final  Resp panel by RT-PCR (RSV, Flu A&B, Covid) Anterior Nasal Swab     Status: None   Collection Time: 10/23/22 12:17 AM   Specimen: Anterior Nasal Swab  Result Value Ref Range Status   SARS Coronavirus 2 by RT PCR NEGATIVE NEGATIVE Final    Comment: (NOTE) SARS-CoV-2 target nucleic acids are NOT DETECTED.  The SARS-CoV-2 RNA is generally detectable in upper respiratory specimens during the acute phase of infection. The lowest concentration of SARS-CoV-2 viral copies this assay can detect is 138 copies/mL. A negative result does not preclude SARS-Cov-2 infection and should not be used as the sole basis for treatment or other patient management decisions. A negative result may occur with  improper specimen collection/handling, submission of specimen other than nasopharyngeal swab, presence of viral mutation(s) within the areas targeted by this assay, and inadequate number of viral copies(<138 copies/mL). A negative result must be combined with clinical observations, patient history, and epidemiological information. The expected result is Negative.  Fact Sheet for Patients:  BloggerCourse.com  Fact Sheet for Healthcare Providers:   SeriousBroker.it  This test is no t yet approved or cleared by the [place] FDA and  has been authorized for detection and/or diagnosis of SARS-CoV-2 by FDA under an Emergency Use Authorization (EUA). This EUA will remain  in effect (meaning this test can be used) for the duration of the COVID-19 declaration under Section 564(b)(1) of the Act, 21 U.S.C.section 360bbb-3(b)(1), unless the authorization is terminated  or revoked sooner.       Influenza A by PCR NEGATIVE NEGATIVE Final   Influenza B by PCR NEGATIVE NEGATIVE Final    Comment: (NOTE) The Xpert Xpress SARS-CoV-2/FLU/RSV plus assay is intended as an aid in the diagnosis of influenza from Nasopharyngeal swab specimens and should not be used as a sole basis for treatment. Nasal washings and aspirates are unacceptable for Xpert Xpress SARS-CoV-2/FLU/RSV testing.  Fact Sheet for Patients: BloggerCourse.com  Fact Sheet for Healthcare Providers: SeriousBroker.it  This test is not yet approved or cleared by the [place] FDA and has been authorized for detection and/or diagnosis of SARS-CoV-2 by FDA under an Emergency Use Authorization (EUA). This EUA will remain in effect (meaning this test can be used) for the duration of the COVID-19 declaration under Section 564(b)(1) of the Act, 21 U.S.C. section 360bbb-3(b)(1), unless the authorization is terminated or revoked.  Resp Syncytial Virus by PCR NEGATIVE NEGATIVE Final    Comment: (NOTE) Fact Sheet for Patients: BloggerCourse.com  Fact Sheet for Healthcare Providers: SeriousBroker.it  This test is not yet approved or cleared by the [place] FDA and has been authorized for detection and/or diagnosis of SARS-CoV-2 by FDA under an Emergency Use Authorization (EUA). This EUA will remain in effect (meaning this test can be used) for  the duration of the COVID-19 declaration under Section 564(b)(1) of the Act, 21 U.S.C. section 360bbb-3(b)(1), unless the authorization is terminated or revoked.  Performed at [facility], [address] [place], [place] [address]   Culture, Urine (Do not remove urinary catheter, catheter placed by urology or difficult to place)     Status: None   Collection Time: 10/23/22 12:17 AM   Specimen: Urine, Catheterized  Result Value Ref Range Status   Specimen Description   Final    URINE, CATHETERIZED Performed at [facility], [address] [place], [place] [address]    Special Requests   Final    NONE Performed at [facility], [address], [place], [place] [address]    Culture   Final    NO GROWTH Performed at [facility], [address], [place], [place] [address]    Report Status 10/24/2022 FINAL  Final  Aerobic/Anaerobic Culture w Gram Stain (surgical/deep wound)     Status: None   Collection Time: 10/23/22  1:03 AM   Specimen: Foot  Result Value Ref Range Status   Specimen Description   Final    FOOT LEFT Performed at [facility], [address], [place], [place] [address]    Special Requests   Final    NONE Performed at [facility], [address] [place], [place] [address]    Gram Stain   Final    RARE WBC PRESENT, PREDOMINANTLY PMN MODERATE GRAM POSITIVE COCCI RARE BUDDING YEAST SEEN    Culture   Final    RARE PROTEUS MIRABILIS ABUNDANT STAPHYLOCOCCUS AUREUS ABUNDANT STREPTOCOCCUS AGALACTIAE TESTING AGAINST S. AGALACTIAE NOT ROUTINELY PERFORMED DUE TO PREDICTABILITY OF AMP/PEN/VAN SUSCEPTIBILITY. NO ANAEROBES ISOLATED Performed at [facility], [address], [place], [place] [address]    Report Status 10/28/2022 FINAL  Final   Organism ID, Bacteria PROTEUS MIRABILIS  Final   Organism ID, Bacteria STAPHYLOCOCCUS AUREUS  Final      Susceptibility   Proteus mirabilis - MIC*     AMPICILLIN <=2 SENSITIVE Sensitive     CEFAZOLIN <=4 SENSITIVE Sensitive     CEFEPIME <=0.12 SENSITIVE Sensitive     CEFTAZIDIME <=1 SENSITIVE Sensitive     CEFTRIAXONE <=0.25 SENSITIVE Sensitive     CIPROFLOXACIN <=0.25 SENSITIVE Sensitive     GENTAMICIN >=16 RESISTANT Resistant     IMIPENEM 2 SENSITIVE Sensitive     TRIMETH/SULFA >=320 RESISTANT Resistant     AMPICILLIN/SULBACTAM <=2 SENSITIVE Sensitive     PIP/TAZO <=4 SENSITIVE Sensitive     * RARE PROTEUS MIRABILIS   Staphylococcus aureus - MIC*    CIPROFLOXACIN >=8 RESISTANT Resistant     ERYTHROMYCIN >=8 RESISTANT Resistant     GENTAMICIN <=0.5 SENSITIVE Sensitive     OXACILLIN >=4 RESISTANT Resistant     TETRACYCLINE <=1 SENSITIVE Sensitive     VANCOMYCIN <=0.5 SENSITIVE Sensitive     TRIMETH/SULFA 160 RESISTANT Resistant     CLINDAMYCIN >=8 RESISTANT Resistant     RIFAMPIN <=0.5 SENSITIVE Sensitive     Inducible Clindamycin  NEGATIVE Sensitive     * ABUNDANT STAPHYLOCOCCUS AUREUS  MRSA Next Gen by PCR, Nasal     Status: None   Collection Time: 10/23/22  9:39 AM   Specimen: Nasal Mucosa; Nasal Swab  Result Value Ref Range Status   MRSA by PCR Next Gen NOT DETECTED NOT DETECTED Final    Comment: (NOTE) The GeneXpert MRSA Assay (FDA approved for NASAL specimens only), is one component of a comprehensive MRSA colonization surveillance program. It is not intended to diagnose MRSA infection nor to guide or monitor treatment for MRSA infections. Test performance is not FDA approved in patients less than 53 years old. Performed at [facility], [address] [place], [place] [address]   CSF culture w Gram Stain     Status: None   Collection Time: 10/23/22  1:36 PM   Specimen: PATH Cytology CSF; Cerebrospinal Fluid  Result Value Ref Range Status   Specimen Description CSF  Final   Special Requests NONE  Final   Gram Stain   Final    WBC PRESENT,BOTH PMN AND MONONUCLEAR NO ORGANISMS SEEN CYTOSPIN SMEAR     Culture   Final    NO GROWTH Performed at [facility], [address] [place], [place] [address]    Report Status 10/26/2022 FINAL  Final  Culture, fungus without smear     Status: None (Preliminary result)   Collection Time: 10/23/22  1:36 PM   Specimen: PATH Cytology CSF; Cerebrospinal Fluid  Result Value Ref Range Status   Specimen Description CSF  Final   Special Requests NONE  Final   Culture   Final    NO FUNGUS ISOLATED AFTER 6 DAYS Performed at [facility], [address] [place], [place] [address]    Report Status PENDING  Incomplete  Anaerobic culture w Gram Stain     Status: None   Collection Time: 10/23/22  1:36 PM   Specimen: PATH Cytology CSF; Cerebrospinal Fluid  Result Value Ref Range Status   Specimen Description CSF  Final   Special Requests NONE  Final   Gram Stain   Final    WBC PRESENT,BOTH PMN AND MONONUCLEAR NO ORGANISMS SEEN CYTOSPIN SMEAR    Culture   Final    NO ANAEROBES ISOLATED Performed at [facility], [address] [place], [place] [address]    Report Status 10/28/2022 FINAL  Final     Labs:   Basic Metabolic Panel: Recent Labs  Lab 10/24/22 0454 10/24/22 1647 10/25/22 0423 10/25/22 1741 10/25/22 1947 10/25/22 2224 10/26/22 0419 10/27/22 0319 10/28/22 0244 10/30/22 0603  NA 137  --  135  --  138 140 139 135 134* 134*  K 3.3*  --  3.5  --  3.2* 3.2* 3.7 3.1* 3.7 4.4  CL 106  --  104  --  103  --  103 99 97* 99  CO2 21*  --  24  --  26  --  27 27 26 27   GLUCOSE 184*  --  265*  --  118*  --  104* 126* 90 169*  BUN 10  --  15  --  13  --  14 14 11 13   CREATININE 1.09  --  1.00  --  0.92  --  1.04 0.82 0.97 1.06  CALCIUM 8.6*  --  8.2*  --  8.4*  --  8.4* 8.7* 8.7* 9.0  MG 1.6* 1.8 1.6* 9.2* 2.0  --  1.7 1.6* 1.8 1.7  PHOS 2.0* 3.8 3.1 2.5  --   --   --   --   --   --     CBC: Recent Labs  Lab 10/24/22 0454 10/25/22 2224 10/26/22 0419 10/27/22 0319 10/28/22 0244  WBC 10.0  --  9.8 8.5 6.8  HGB  13.1 13.3 13.2 13.3 12.9*  HCT 37.9* 39.0 38.3* 37.9* 37.9*  MCV 87.3  --  88.2 86.3 88.1  PLT 179  --  186 179 213     CBG: Recent Labs  Lab 10/29/22 0630 10/29/22 1229 10/29/22 1719 10/29/22 2116 10/30/22 0609  GLUCAP 163* 214* 165* 198* 174*     IMAGING STUDIES DG CHEST PORT 1 VIEW  Result Date: 10/26/2022 CLINICAL DATA:  Hypoxia. EXAM: PORTABLE CHEST 1 VIEW COMPARISON:  October 25, 2022 at 10:39 p.m. FINDINGS: EKG leads project over the chest. Cardiomediastinal contours and hilar structures are normal. Lungs are free from consolidation. No gross pleural effusion. Signs of vascular crowding the setting of mildly low lung volumes. Subtle increased interstitial prominence RIGHT greater than LEFT and also seen at the LEFT lung base, could be defect related to vascular crowding. On limited assessment no acute skeletal findings. IMPRESSION: Low lung volumes with vascular crowding. Mild increased interstitial prominence in the RIGHT mid and lower chest in lower chest could be related to vascular crowding effective low lung volumes. Mild pneumonitis is not excluded attention on follow-up. Electronically Signed   By: Donzetta Kohut M.D.   On: 10/26/2022 08:19   DG Chest Port 1 View  Result Date: 10/25/2022 CLINICAL DATA:  Acute respiratory failure EXAM: PORTABLE CHEST 1 VIEW COMPARISON:  10/24/2022 FINDINGS: The heart size and mediastinal contours are within normal limits. Both lungs are clear. The visualized skeletal structures are unremarkable. IMPRESSION: No active disease. Electronically Signed   By: Alcide Clever M.D.   On: 10/25/2022 22:49   DG FL GUIDED LUMBAR PUNCTURE  Result Date: 10/25/2022 CLINICAL DATA:  Patient admitted for status epilepticus. Diagnostic lumbar puncture requested EXAM: DIAGNOSTIC LUMBAR PUNCTURE UNDER FLUOROSCOPIC GUIDANCE COMPARISON:  DG Fluoro Guided LP 10/03/17 FLUOROSCOPY: Radiation Exposure Index (as provided by the fluoroscopic device): 22.5 mGy Kerma  PROCEDURE: Informed consent was obtained from the patient's wife prior to the procedure, including potential complications of headache, allergy, and pain. With the patient prone, the lower back was prepped with Betadine. 1% Lidocaine was used for local anesthesia. Lumbar puncture was performed at the L4/L5 level using a 5 inch 20 gauge needle with return of blood tinged CSF (that became clear after a few ml) with an opening pressure of 29 cm water. 10 ml of CSF were obtained for laboratory studies. The patient tolerated the procedure well and there were no apparent complications. IMPRESSION: Technically successful L4-L5 lumbar puncture yielding 10 mL of blood-tinged CSF that cleared after a few mL. Opening pressure of 29 cm water. Read by: Alwyn Ren, NP. Supervised and interpreted by Jeronimo Greaves, M.D. Electronically Signed   By: Jeronimo Greaves M.D.   On: 10/25/2022 07:57   Overnight EEG with video  Result Date: 10/24/2022 Charlsie Quest, MD     10/25/2022  9:57 AM Patient Name: KAIYU MIRABAL MRN: [id number] Epilepsy Attending: Charlsie Quest Referring Physician/Provider: Gordy Councilman, MD Duration: 10/23/2022 2376 to 10/24/2022 2831 Patient history: 52 year old male presented with seizures.  EEG to evaluate for seizure. Level of alertness: comatose AEDs during EEG study: Keppra, Versed, propofol Technical aspects: This EEG study was  done with scalp electrodes positioned according to the 10-20 International system of electrode placement. Electrical activity was reviewed with band pass filter of 1-70Hz , sensitivity of 7 uV/mm, display speed of 74mm/sec with a 60Hz  notched filter applied as appropriate. EEG data were recorded continuously and digitally stored.  Video monitoring was available and reviewed as appropriate. Description: At the beginning of the study, EEG showed continuous generalized 3-6 hz theta- delta slowing with overriding 12 to 15 Hz generalized beta activity.  As sedation was weaned, EEG  showed posterior dominant rhythm of 8 Hz activity of moderate voltage (25-35 uV) seen predominantly in posterior head regions, symmetric and reactive to eye opening and eye closing.EEG also showed intermittent generalized 3 to 6 Hz theta-delta slowing. Hyperventilation and photic stimulation were not performed.   Event button was pressed on 10/23/2022 at 2204.  Patient was noted to be shivering.  Concomitant EEG before, during and after the event did not show any EEG changes suggest seizure. Event button was pressed on 10/24/2022 at 0611 and [phone] for opening and closing eyes.  Concomitant EEG before, during and after the event did not show any EEG changes suggest seizure. EKG artifact was seen during the study. ABNORMALITY -Continuous slow, generalized IMPRESSION: This study was initially suggestive of severe diffuse encephalopathy, nonspecific etiology but likely related to sedation.  As sedation was weaned, EEG improved and was suggestive of mild to moderate diffuse encephalopathy, nonspecific to etiology.  No seizures or epileptiform discharges were seen during the study.  Event button was pressed on 10/23/2022 at 2004 for shivering without concomitant EEG change. This was not an epileptic event. Event button was pressed on 10/24/2022 at 0611 and 0633 but opening and closing eyes without concomitant EEG change.  These were not epileptic events. Lora Havens   DG Chest Port 1 View  Result Date: 10/24/2022 CLINICAL DATA:  Intubated.  Seizure. EXAM: PORTABLE CHEST 1 VIEW COMPARISON:  Radiographs 10/23/2022 and 07/15/2017. FINDINGS: 0526 hours. Interval intubation. Tip of the endotracheal tube is at the mid tracheal level. An enteric tube has been placed, projecting below the diaphragm, with the tip not visualized. The side-hole is below the GE junction. Persistent low lung volumes. The heart size and mediastinal contours are stable. The lungs appear clear. No evidence of pneumothorax or significant pleural  effusion. The bones appear unremarkable. Telemetry leads overlie the chest. IMPRESSION: Interval intubation and enteric tube placement as described. No evidence of acute cardiopulmonary process. Electronically Signed   By: Richardean Sale M.D.   On: 10/24/2022 08:00   DG Abd 1 View  Result Date: 10/23/2022 CLINICAL DATA:  Gastric catheter placement EXAM: ABDOMEN - 1 VIEW COMPARISON:  Film from earlier in the same day. FINDINGS: Gastric catheter is again noted in the stomach. The overall appearance is stable from the prior exam. The proximal side port remains in the distal esophagus. IMPRESSION: No change in positioning of gastric catheter. Electronically Signed   By: Inez Catalina M.D.   On: 10/23/2022 23:41   DG Abd 1 View  Result Date: 10/23/2022 CLINICAL DATA:  Check gastric catheter placement EXAM: ABDOMEN - 1 VIEW COMPARISON:  10/23/2022 FINDINGS: Gastric catheter has been advanced and now lies within the stomach. Proximal side port lies in the distal esophagus however. This could be advanced deeper into the stomach. IMPRESSION: Gastric catheter as described with the tip in the stomach although the proximal side port lies in the distal esophagus. This could be advanced deeper into the stomach. Electronically Signed  By: Alcide CleverMark  Lukens M.D.   On: 10/23/2022 20:29   DG Abd 1 View  Result Date: 10/23/2022 CLINICAL DATA:  Orogastric tube EXAM: ABDOMEN - 1 VIEW COMPARISON:  Chest x-ray 10/23/2022 FINDINGS: Orogastric tube tip is unchanged in position in the proximal stomach just beyond the gastroesophageal junction. No dilated bowel loops are seen. IMPRESSION: Orogastric tube tip is unchanged in position in the proximal stomach just beyond the gastroesophageal junction. Recommend advancing tube 6 cm. Electronically Signed   By: Darliss CheneyAmy  Guttmann M.D.   On: 10/23/2022 19:10   DG Abd 1 View  Result Date: 10/23/2022 CLINICAL DATA:  Orogastric tube EXAM: ABDOMEN - 1 VIEW COMPARISON:  None Available. FINDINGS:  Orogastric tube tip is in the proximal stomach with sidehole the level of the distal esophagus. No dilated bowel loops are seen. IMPRESSION: Orogastric tube tip is in the proximal stomach with sidehole the level of the distal esophagus. Recommend advancing tube. Electronically Signed   By: Darliss CheneyAmy  Guttmann M.D.   On: 10/23/2022 17:49   MR CERVICAL SPINE W WO CONTRAST  Result Date: 10/23/2022 CLINICAL DATA:  MS EXAM: MRI CERVICAL SPINE WITHOUT AND WITH CONTRAST TECHNIQUE: Multiplanar and multiecho pulse sequences of the cervical spine, to include the craniocervical junction and cervicothoracic junction, were obtained without and with intravenous contrast. CONTRAST:  10mL GADAVIST GADOBUTROL 1 MMOL/ML IV SOLN COMPARISON:  09/26/17 MRI C SPine FINDINGS: Alignment: There is trace retrolisthesis of C4 on C5. Vertebrae: No fracture, evidence of discitis, or bone lesion. Cord: Normal signal and morphology. No evidence of demyelinating disease. Posterior Fossa, vertebral arteries, paraspinal tissues: Please see separately dictated MRI of the brain for intracranial findings. Partially visualized endotracheal and enteric tube in place. The right vertebral artery is small in caliber. Disc levels: C1-C2: Mild degenerative change. C2-C3: Moderate bilateral facet degenerative change. No significant disc bulge. No spinal canal stenosis. Mild right neural foraminal stenosis. C3-C4: Circumferential disc bulge. Severe bilateral facet degenerative change. Moderate spinal canal narrowing with deformation of the ventral spinal cord. Uncovertebral hypertrophy. Mild-to-moderate bilateral neural narrowing. These findings have progressed compared to 2019. C4-C5: Circumferential disc bulge. Severe bilateral facet degenerative change. Ligamentum flavum hypertrophy/calcification. Severe spinal canal narrowing. Severe left neural foraminal stenosis. Moderate right neural foraminal stenosis. These findings have progressed compared to prior exam.  C5-C6: Severe spinal canal narrowing. Ligamentum flavum hypertrophy/calcification. Severe left and moderate right facet degenerative change. Severe bilateral neural foraminal stenosis. Minimal disc bulge. C6-C7: Severe spinal canal stenosis. Ligamentum flavum hypertrophy/calcification. Moderate to severe bilateral facet degenerative change. Moderate bilateral neural foraminal narrowing. C7-T1: Osseous fusion. Mild bilateral facet degenerative change. No spinal canal or neural foraminal stenosis. IMPRESSION: 1. No evidence of demyelinating disease of the cervical spinal cord. 2. Advanced multilevel degenerative changes of the cervical spine, progressed compared to prior exam. There is now severe spinal canal stenosis at the C4, C5, and C6 vertebral body levels, predominantly due to new ligamentum flavum hypertrophy versus calcification. Recommend further evaluation with a cervical spine CT for more definitive characterization. 3. Severe neural foraminal stenosis is also present at C4-C5 (left) and C5-C6 (bilateral). Electronically Signed   By: Lorenza CambridgeHemant  Desai M.D.   On: 10/23/2022 16:13   MR BRAIN W WO CONTRAST  Result Date: 10/23/2022 CLINICAL DATA:  Seizure, new EXAM: MRI HEAD WITHOUT AND WITH CONTRAST TECHNIQUE: Multiplanar, multiecho pulse sequences of the brain and surrounding structures were obtained without and with intravenous contrast. CONTRAST:  10mL GADAVIST GADOBUTROL 1 MMOL/ML IV SOLN COMPARISON:  09/03/2017 FINDINGS: Brain:  The hippocampi are symmetric in size and signal, with some volume loss bilaterally. No heterotopia or evidence of cortical dysgenesis. No restricted diffusion to suggest acute or subacute infarct. No abnormal parenchymal or meningeal enhancement. No acute hemorrhage, mass, mass effect, or midline shift. No hydrocephalus or extra-axial collection. No hemosiderin deposition to suggest remote hemorrhage. Normal pituitary and craniocervical junction. Again noted are ovoid T1  hypointense, T2 hyperintense lesions in the cerebral and cerebellar hemispheres and pons. Many of the previously noted lesions now correlate with areas of focal encephalomalacia. Confluent increased T2 signal in the periventricular white matter, which appears increased from the prior exam, with encephalomalacia in the bilateral occipital lobes (series 8, image 103), which is new from the prior exam. Vascular: Patent arterial flow voids. Normal arterial and venous enhancement. Skull and upper cervical spine: Normal marrow signal. Sinuses/Orbits: Mild mucosal thickening in the ethmoid air cells. Status post bilateral lens replacements. Other: The mastoid air cells are well aerated. Orogastric tube looped in the oropharynx. IMPRESSION: 1. No acute intracranial process.  No seizure etiology identified. 2. 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 3. Again noted are ovoid T1 hypointense, T2 hyperintense lesions in the cerebral and cerebellar hemispheres and pons, which are favored to represent demyelinating disease, although some of these may be the sequela of chronic small vessel ischemic disease. No abnormal enhancement. 4. Orogastric tube looped in the oropharynx. Repositioning is recommended. Electronically Signed   By: Merilyn Baba M.D.   On: 10/23/2022 15:59   DG Abd 1 View  Result Date: 10/23/2022 CLINICAL DATA:  OG tube EXAM: ABDOMEN - 1 VIEW COMPARISON:  Abdominal radiograph 07/15/17 FINDINGS: Enteric tube courses below diaphragm with the tip projecting over the expected location of the stomach and the side hole above the level of the GE junction. Recommend advancement. Lung bases are clear. No dilated loops of bowel are visualized in the imaged portion of the abdomen. No acute osseous abnormality. IMPRESSION: Enteric tube courses below  diaphragm with the tip projecting over the expected location of the stomach and the side hole above the level of the GE junction. Recommend advancement. Electronically Signed   By: Marin Roberts M.D.   On: 10/23/2022 11:27   CT Head Wo Contrast  Result Date: 10/23/2022 CLINICAL DATA:  Recent seizure activity with altered mental status EXAM: CT HEAD WITHOUT CONTRAST TECHNIQUE: Contiguous axial images were obtained from the base of the skull through the vertex without intravenous contrast. RADIATION DOSE REDUCTION: This exam was performed according to the departmental dose-optimization program which includes automated exposure control, adjustment of the mA and/or kV according to patient size and/or use of iterative reconstruction technique. COMPARISON:  08/20/2021 FINDINGS: Brain: No evidence of acute infarction, hemorrhage, hydrocephalus, extra-axial collection or mass lesion/mass effect. Mild atrophic changes and chronic white matter ischemic changes are noted. Vascular: No hyperdense vessel or unexpected calcification. Skull: Normal. Negative for fracture or focal lesion. Sinuses/Orbits: No acute finding. Other: Endotracheal tube is noted. IMPRESSION: Chronic atrophic and ischemic changes.  No acute abnormality noted Electronically Signed   By: Inez Catalina M.D.   On: 10/23/2022 01:43   DG Chest Port 1 View  Result Date: 10/23/2022 CLINICAL DATA:  Status post intubation EXAM: PORTABLE CHEST 1 VIEW COMPARISON:  07/15/2017 FINDINGS: Cardiac shadow is stable. Endotracheal tube is noted 2.7 cm above the carina. Lungs are clear but  hypoinflated. No bony abnormality is noted. IMPRESSION: No acute abnormality noted. Electronically Signed   By: Alcide Clever M.D.   On: 10/23/2022 00:42    DISCHARGE EXAMINATION: Vitals:   10/29/22 1938 10/29/22 2349 10/30/22 0322 10/30/22 0717  BP: (!) 137/101 (!) 144/86 (!) 132/97 129/75  Pulse: 89 85 81 84  Resp: Temp: 98.3 F (36.8 C) 98.3 F (36.8 C) 98.2  F (36.8 C) (!) 97.4 F (36.3 C)  TempSrc: Oral Oral Oral Oral  SpO2: 96% 95% 96% 95%  Weight:      Height:       General appearance: Awake alert.  In no distress Resp: Clear to auscultation bilaterally.  Normal effort Cardio: S1-S2 is normal regular.  No S3-S4.  No rubs murmurs or bruit GI: Abdomen is soft.  Nontender nondistended.  Bowel sounds are present normal.  No masses organomegaly  DISPOSITION: Inpatient rehabilitation  Discharge Instructions     Ambulatory referral to Neurology   Complete by: As directed    An appointment is requested in approximately:6- 8 weeks   Call MD for:  difficulty breathing, headache or visual disturbances   Complete by: As directed    Call MD for:  extreme fatigue   Complete by: As directed    Call MD for:  persistant dizziness or light-headedness   Complete by: As directed    Call MD for:  persistant nausea and vomiting   Complete by: As directed    Call MD for:  severe uncontrolled pain   Complete by: As directed    Call MD for:  temperature >100.4   Complete by: As directed    Diet Carb Modified   Complete by: As directed    Discharge instructions   Complete by: As directed    Please review instructions on the discharge summary  You were cared for by a hospitalist during your hospital stay. If you have any questions about your discharge medications or the care you received while you were in the hospital after you are discharged, you can call the unit and asked to speak with the hospitalist on call if the hospitalist that took care of you is not available. Once you are discharged, your primary care physician will handle any further medical issues. Please note that NO REFILLS for any discharge medications will be authorized once you are discharged, as it is imperative that you return to your primary care physician (or establish a relationship with a primary care physician if you do not have one) for your aftercare needs so that they can  reassess your need for medications and monitor your lab values. If you do not have a primary care physician, you can call [phone] for a physician referral.   Discharge wound care:   Complete by: As directed    Wound care  Daily      Comments: Cleanse foot wound with saline, pat dry Cut to fit silver hydrofiber (Aquacel Ag+[employer] [place] # [phone]) and place into wound bed, top with foam Change every over day   Increase activity slowly   Complete by: As directed           Allergies as of 10/30/2022       Reactions   Shellfish Allergy Nausea And Vomiting   Fentanyl Itching, Swelling, Rash        Medication List     STOP taking these medications    ammonium lactate 12 % lotion Commonly known as: LAC-HYDRIN  lisinopril 10 MG tablet Commonly known as: ZESTRIL   NovoLOG FlexPen 100 UNIT/ML FlexPen Generic drug: insulin aspart   tamsulosin 0.4 MG Caps capsule Commonly known as: FLOMAX   Vitamin D (Ergocalciferol) 1.25 MG (50000 UNIT) Caps capsule Commonly known as: DRISDOL       TAKE these medications    aspirin EC 81 MG tablet Take 1 tablet (81 mg total) by mouth daily. Swallow whole.   benzonatate 100 MG capsule Commonly known as: TESSALON Take 1 capsule (100 mg total) by mouth 3 (three) times daily as needed for cough.   brimonidine 0.2 % ophthalmic solution Commonly known as: ALPHAGAN Place 1 drop into both eyes 2 (two) times daily.   finasteride 5 MG tablet Commonly known as: PROSCAR Take 5 mg by mouth daily.   FreeStyle [employer] Misc U UTD Q 10 DAYS.   insulin detemir 100 UNIT/ML FlexPen Commonly known as: LEVEMIR Inject 30 Units into the skin daily. What changed: how much to take   levETIRAcetam 500 MG tablet Commonly known as: KEPPRA Take 1 tablet (500 mg total) by mouth 2 (two) times daily.   loperamide 2 MG capsule Commonly known as: IMODIUM Take 2 capsules (4 mg total) by mouth 3 (three) times daily as needed for diarrhea or loose  stools.   melatonin 3 MG Tabs tablet Take 1 tablet (3 mg total) by mouth at bedtime as needed.   saccharomyces boulardii 250 MG capsule Commonly known as: FLORASTOR Take 1 capsule (250 mg total) by mouth 2 (two) times daily for 10 days.   Teriflunomide 14 MG Tabs TAKE 1 TABLET BY MOUTH DAILY.               Discharge Care Instructions  (From admission, onward)           Start     Ordered   10/30/22 0000  Discharge wound care:       Comments: Wound care  Daily      Comments: Cleanse foot wound with saline, pat dry Cut to fit silver hydrofiber (Aquacel Ag+Kellie [place] # [phone]) and place into wound bed, top with foam Change every over day   10/30/22 1019              Follow-up Information     Sater, Nanine Means, MD Follow up.   Specialty: Neurology Contact information: [place]  [address] [phone]                 TOTAL DISCHARGE TIME: 77 minutes  [place]  [facility] Pager on [email or web address]  10/30/2022, 10:24 AM

## 2022-10-30 NOTE — Progress Notes (Signed)
Chaplain responded to a consult request for advance directives. Chaplain provided education on HCPOA and Living Will. Chaplain provided compassionate presence and reflective listening. Chaplain left advance directives paperwork with patient and informed him of how to request a follow up visit.   Note prepared by Abbott Pao, [place] Resident [phone]

## 2022-10-30 NOTE — Progress Notes (Signed)
Mobility Specialist Progress Note   10/30/22 1538  Mobility  Activity Ambulated with assistance in hallway  Level of Assistance Minimal assist, patient does 75% or more  Assistive Device None  Distance Ambulated (ft) 270 ft  Range of Motion/Exercises Active;All extremities  Activity Response Tolerated well   Patient received in supine and eager to participate. Ambulated in hallway with min A and without an AD. No overt LOB observed but required occasional cueing to slow down. Returned to room without complaint or incident. Was left in supine with all needs met, bed alarm set and call bell in reach.   [name], BS EXP Mobility Specialist Please contact via SecureChat or Rehab office at [phone]

## 2022-10-30 NOTE — TOC Progression Note (Signed)
Transition of Care [facility]) - Progression Note    Patient Details  Name: [name] MRN: [id number] Date of Birth: August 10, 1971  [facility] CM/SW Contact  Pollie Friar, RN Phone Number: 10/30/2022, 4:06 PM  Clinical Narrative:    CM has called [facility] 3 times today to see if they are going to offer a bed for rehab to the patient. Per Pam at [facility] they are still reviewing. CM has updated the patient. He is willing to wait until am to see if they offer but may decided to d/c home tomorrow. TOC following.   Expected Discharge Plan: IP Rehab Facility Barriers to Discharge: Continued Medical Work up  Expected Discharge Plan and Services   Discharge Planning Services: CM Consult Post Acute Care Choice: IP Rehab Living arrangements for the past 2 months: Apartment                                       Social Determinants of Health (SDOH) Interventions SDOH Screenings   Food Insecurity: No Food Insecurity (10/29/2022)  Housing: Low Risk  (10/29/2022)  Transportation Needs: No Transportation Needs (10/29/2022)  Utilities: Not At Risk (10/29/2022)  Tobacco Use: Low Risk  (10/29/2022)    Readmission Risk Interventions     No data to display

## 2022-10-30 NOTE — Plan of Care (Signed)
  Problem: Education: Goal: Ability to describe self-care measures that may prevent or decrease complications (Diabetes Survival Skills Education) will improve Outcome: Adequate for Discharge Goal: Individualized Educational Video(s) Outcome: Adequate for Discharge   Problem: Coping: Goal: Ability to adjust to condition or change in health will improve Outcome: Adequate for Discharge   Problem: Fluid Volume: Goal: Ability to maintain a balanced intake and output will improve Outcome: Adequate for Discharge   Problem: Health Behavior/Discharge Planning: Goal: Ability to identify and utilize available resources and services will improve Outcome: Adequate for Discharge Goal: Ability to manage health-related needs will improve Outcome: Adequate for Discharge   Problem: Metabolic: Goal: Ability to maintain appropriate glucose levels will improve Outcome: Adequate for Discharge   Problem: Nutritional: Goal: Maintenance of adequate nutrition will improve Outcome: Adequate for Discharge Goal: Progress toward achieving an optimal weight will improve Outcome: Adequate for Discharge   Problem: Skin Integrity: Goal: Risk for impaired skin integrity will decrease Outcome: Adequate for Discharge   Problem: Tissue Perfusion: Goal: Adequacy of tissue perfusion will improve Outcome: Adequate for Discharge   Problem: Education: Goal: Ability to describe self-care measures that may prevent or decrease complications (Diabetes Survival Skills Education) will improve Outcome: Adequate for Discharge Goal: Individualized Educational Video(s) Outcome: Adequate for Discharge   Problem: Cardiac: Goal: Ability to maintain an adequate cardiac output will improve Outcome: Adequate for Discharge   Problem: Health Behavior/Discharge Planning: Goal: Ability to identify and utilize available resources and services will improve Outcome: Adequate for Discharge Goal: Ability to manage health-related  needs will improve Outcome: Adequate for Discharge   Problem: Fluid Volume: Goal: Ability to achieve a balanced intake and output will improve Outcome: Adequate for Discharge   Problem: Metabolic: Goal: Ability to maintain appropriate glucose levels will improve Outcome: Adequate for Discharge   Problem: Nutritional: Goal: Maintenance of adequate nutrition will improve Outcome: Adequate for Discharge Goal: Maintenance of adequate weight for body size and type will improve Outcome: Adequate for Discharge   Problem: Respiratory: Goal: Will regain and/or maintain adequate ventilation Outcome: Adequate for Discharge   Problem: Urinary Elimination: Goal: Ability to achieve and maintain adequate renal perfusion and functioning will improve Outcome: Adequate for Discharge   Problem: Safety: Goal: Non-violent Restraint(s) Outcome: Adequate for Discharge   Problem: Education: Goal: Knowledge of General Education information will improve Description: Including pain rating scale, medication(s)/side effects and non-pharmacologic comfort measures Outcome: Adequate for Discharge   Problem: Health Behavior/Discharge Planning: Goal: Ability to manage health-related needs will improve Outcome: Adequate for Discharge   Problem: Clinical Measurements: Goal: Ability to maintain clinical measurements within normal limits will improve Outcome: Adequate for Discharge Goal: Will remain free from infection Outcome: Adequate for Discharge Goal: Diagnostic test results will improve Outcome: Adequate for Discharge Goal: Respiratory complications will improve Outcome: Adequate for Discharge Goal: Cardiovascular complication will be avoided Outcome: Adequate for Discharge   Problem: Activity: Goal: Risk for activity intolerance will decrease Outcome: Adequate for Discharge   Problem: Nutrition: Goal: Adequate nutrition will be maintained Outcome: Adequate for Discharge   Problem:  Coping: Goal: Level of anxiety will decrease Outcome: Adequate for Discharge   Problem: Elimination: Goal: Will not experience complications related to bowel motility Outcome: Adequate for Discharge Goal: Will not experience complications related to urinary retention Outcome: Adequate for Discharge   Problem: Pain Managment: Goal: General experience of comfort will improve Outcome: Adequate for Discharge

## 2022-10-31 LAB — GLUCOSE, CAPILLARY: Glucose-Capillary: 189 mg/dL — ABNORMAL HIGH (ref 70–99)

## 2022-10-31 MED ORDER — INSULIN DETEMIR 100 UNIT/ML ~~LOC~~ SOLN
20.0000 [IU] | Freq: Two times a day (BID) | SUBCUTANEOUS | Status: DC
  Administered 2022-10-31: 20 [IU] via SUBCUTANEOUS
  Filled 2022-10-31 (×2): qty 0.2

## 2022-10-31 MED ORDER — BLOOD GLUCOSE TEST VI STRP: 1.0000 | ORAL_STRIP | Freq: Three times a day (TID) | 0 refills | Status: DC

## 2022-10-31 MED ORDER — FREESTYLE LIBRE SENSOR SYSTEM MISC: 11 refills | Status: AC

## 2022-10-31 MED ORDER — LANCET DEVICE MISC: 1.0000 | Freq: Three times a day (TID) | 0 refills | Status: AC

## 2022-10-31 MED ORDER — LANCETS MISC. MISC: 1.0000 | Freq: Three times a day (TID) | 0 refills | Status: DC

## 2022-10-31 MED ORDER — LANCET DEVICE MISC: 1.0000 | Freq: Three times a day (TID) | 0 refills | Status: DC

## 2022-10-31 MED ORDER — BLOOD GLUCOSE MONITORING SUPPL DEVI: 1.0000 | Freq: Three times a day (TID) | 0 refills | Status: DC

## 2022-10-31 MED ORDER — BLOOD GLUCOSE TEST VI STRP: 1.0000 | ORAL_STRIP | Freq: Three times a day (TID) | 0 refills | Status: AC

## 2022-10-31 MED ORDER — INSULIN DETEMIR 100 UNIT/ML FLEXPEN: 20.0000 [IU] | PEN_INJECTOR | Freq: Two times a day (BID) | SUBCUTANEOUS | 2 refills | Status: AC

## 2022-10-31 MED ORDER — BLOOD GLUCOSE MONITORING SUPPL DEVI: 1.0000 | Freq: Three times a day (TID) | 0 refills | Status: AC

## 2022-10-31 MED ORDER — LANCETS MISC. MISC: 1.0000 | Freq: Three times a day (TID) | 0 refills | Status: AC

## 2022-10-31 NOTE — Progress Notes (Signed)
Pt safely discharged. Discharge packet provided with teach-back method. VS wnL and as per flow. IVs removed, Pt verbalized understanding. All questions and concerns addressed. Awaiting on diabetic supplies and transport.

## 2022-10-31 NOTE — Progress Notes (Signed)
Mobility Specialist: Progress Note   10/31/22 1020  Mobility  Activity Ambulated with assistance in hallway  Level of Assistance Contact guard assist, steadying assist  Assistive Device None  Distance Ambulated (ft) 270 ft  Activity Response Tolerated well  Mobility Referral Yes  $Mobility charge 1 Mobility   Pt received in the bed and agreeable to mobility. Mod I with bed mobility and contact guard during ambulation for balance. Pt did much better today with maintaining consistent gait speed. No c/o throughout. Pt back to bed after session with call bell and phone in reach. Bed alarm is on.   [place] Jaquis [place]imer Mobility Specialist Please contact via SecureChat or Rehab office at [phone]

## 2022-10-31 NOTE — Discharge Summary (Signed)
[facility]  Physician Discharge Summary   Patient ID: [name] MRN: [id number] DOB/AGE: March 09, 1971 52 y.o.  Admit date: 10/23/2022 Discharge date:   10/31/2022   PCP: Patient, No Pcp Per  DISCHARGE DIAGNOSES:  Seizure disorder  Status epilepticus (HCC)   Acute respiratory failure with hypoxia (HCC) Diabetes mellitus type II, uncontrolled with hyperglycemia   AKI (acute kidney injury) (HCC)   Lactic acidosis   Encephalopathy acute   Pressure injury of skin   RECOMMENDATIONS FOR OUTPATIENT FOLLOW UP: Please check CBC and basic metabolic panel in 3 to 4 days Patient will need to be seen by Dr. Sherol [facility]adeSeiter with [facility] in 6-8 weeks   Home Health: To inpatient rehabilitation Equipment/Devices: None  CODE STATUS: Full code  DISCHARGE CONDITION: fair  Diet recommendation: Modified carbohydrate  INITIAL HISTORY:  52 year old male with PMH which is significant for DM, MS on teriflunomide, and HTN who [facility] with complaint of seizure.  [facility].  He had to be emergently intubated.  Neurology was consulted.  Patient underwent LP.  Stabilized and then extubated.  And then transferred to medical service.  Medically stable for discharge - will set up home health for discharge as he is unable to transition to SNF as previously discussed. Continue close follow up with PCP and Neurology as scheduled.   Consultants: Neurology.  Critical care medicine.   Procedures: EEG.  Lumbar puncture.   HOSPITAL COURSE:   New onset seizures/acute encephalopathy Thought to be secondary to hyperglycemia in the setting of chronic bilateral occipital strokes.  Neurology was consulted.  Patient was started on Keppra which has been continued. Initially there was suspicion for meningitis due to fever.  Lumbar puncture did not suggest the same.   Follow up with [facility]rol [facility] in 6-8 weeks   History  of relapsing remitting MS/chronic bilateral occipital strokes Stable for the most part from neurological standpoint.  Continue with aspirin. Followed by Dr. Sherol [facility]adeSeiter with [facility].  Teriflunomide resumed after discussions with neurology.   Spinal stenosis C4-C6 Stable from a neurological standpoint.  Physical therapy recommending ongoing therapy - HHPT set up.   Acute respiratory failure with hypoxia/concern for evolving pneumonia Patient had high oxygen requirements initially but he was weaned off.  Currently on room air.  Treated with Unasyn and then Augmentin.  Respiratory status is stable.   Acute kidney injury Resolved. Repeat labs with PCP     Hypokalemia/hypomagnesemia Repleted.  Repeat labs with PCP   Diabetes mellitus type 2 uncontrolled with hyperglycemia HbA1c 11.5.  Noted to be on level.  SSI.  CBGs are reasonably well-controlled.     Diabetic foot ulcer Local wound care   Obesity Estimated body mass index is 32.96 kg/m as calculated from the following:   Height as of an earlier encounter on 10/23/22: 5\' 11"  (1.803 m).   Weight as of this encounter: 107.2 kg.   Stage I sacral decubitus, POA Pressure Injury 10/23/22 Sacrum Bilateral Stage 1 -  Intact skin with non-blanchable redness of a localized area usually over a bony prominence. (Active)  10/23/22 [id number]  Location: Sacrum  Location Orientation: Bilateral  Staging: Stage 1 -  Intact skin with non-blanchable redness of a localized area usually over a bony prominence.  Wound Description (Comments):   Present on Admission: Yes   Patient is stable.  Okay for discharge to rehabilitation when bed is available.   PERTINENT LABS:  The results of significant diagnostics from this hospitalization (including imaging, microbiology, ancillary and laboratory) are listed below for reference.    Microbiology: Recent Results (from the past 240 hour(s))  Culture, blood (routine x 2)     Status: None    Collection Time: 10/23/22 12:17 AM   Specimen: BLOOD RIGHT ARM  Result Value Ref Range Status   Specimen Description BLOOD RIGHT ARM  Final   Special Requests   Final    BOTTLES DRAWN AEROBIC AND ANAEROBIC Blood Culture adequate volume   Culture   Final    NO GROWTH 5 DAYS Performed at [facility], [address] [place], [place] [address]    Report Status 10/28/2022 FINAL  Final  Culture, blood (routine x 2)     Status: None   Collection Time: 10/23/22 12:17 AM   Specimen: BLOOD RIGHT ARM  Result Value Ref Range Status   Specimen Description BLOOD RIGHT ARM  Final   Special Requests   Final    BOTTLES DRAWN AEROBIC AND ANAEROBIC Blood Culture results may not be optimal due to an inadequate volume of blood received in culture bottles   Culture   Final    NO GROWTH 5 DAYS Performed at [facility], [address] [place], [place] [address]    Report Status 10/28/2022 FINAL  Final  Resp panel by RT-PCR (RSV, Flu A&B, Covid) Anterior Nasal Swab     Status: None   Collection Time: 10/23/22 12:17 AM   Specimen: Anterior Nasal Swab  Result Value Ref Range Status   SARS Coronavirus 2 by RT PCR NEGATIVE NEGATIVE Final    Comment: (NOTE) SARS-CoV-2 target nucleic acids are NOT DETECTED.  The SARS-CoV-2 RNA is generally detectable in upper respiratory specimens during the acute phase of infection. The lowest concentration of SARS-CoV-2 viral copies this assay can detect is 138 copies/mL. A negative result does not preclude SARS-Cov-2 infection and should not be used as the sole basis for treatment or other patient management decisions. A negative result may occur with  improper specimen collection/handling, submission of specimen other than nasopharyngeal swab, presence of viral mutation(s) within the areas targeted by this assay, and inadequate number of viral copies(<138 copies/mL). A negative result must be combined with clinical observations, patient history,  and epidemiological information. The expected result is Negative.  Fact Sheet for Patients:  EntrepreneurPulse.com.au  Fact Sheet for Healthcare Providers:  IncredibleEmployment.be  This test is no t yet approved or cleared by the [place] FDA and  has been authorized for detection and/or diagnosis of SARS-CoV-2 by FDA under an Emergency Use Authorization (EUA). This EUA will remain  in effect (meaning this test can be used) for the duration of the COVID-19 declaration under Section 564(b)(1) of the Act, 21 U.S.C.section 360bbb-3(b)(1), unless the authorization is terminated  or revoked sooner.       Influenza A by PCR NEGATIVE NEGATIVE Final   Influenza B by PCR NEGATIVE NEGATIVE Final    Comment: (NOTE) The Xpert Xpress SARS-CoV-2/FLU/RSV plus assay is intended as an aid in the diagnosis of influenza from Nasopharyngeal swab specimens and should not be used as a sole basis for treatment. Nasal washings and aspirates are unacceptable for Xpert Xpress SARS-CoV-2/FLU/RSV testing.  Fact Sheet for Patients: EntrepreneurPulse.com.au  Fact Sheet for Healthcare Providers: IncredibleEmployment.be  This test is not yet approved or cleared by the [place] FDA and has been authorized for detection and/or diagnosis of SARS-CoV-2 by FDA under an Emergency Use  Authorization (EUA). This EUA will remain in effect (meaning this test can be used) for the duration of the COVID-19 declaration under Section 564(b)(1) of the Act, 21 U.S.C. section 360bbb-3(b)(1), unless the authorization is terminated or revoked.     Resp Syncytial Virus by PCR NEGATIVE NEGATIVE Final    Comment: (NOTE) Fact Sheet for Patients: BloggerCourse.com  Fact Sheet for Healthcare Providers: SeriousBroker.it  This test is not yet approved or cleared by the [place] FDA and has  been authorized for detection and/or diagnosis of SARS-CoV-2 by FDA under an Emergency Use Authorization (EUA). This EUA will remain in effect (meaning this test can be used) for the duration of the COVID-19 declaration under Section 564(b)(1) of the Act, 21 U.S.C. section 360bbb-3(b)(1), unless the authorization is terminated or revoked.  Performed at [facility], [address] [place], [place] [address]   Culture, Urine (Do not remove urinary catheter, catheter placed by urology or difficult to place)     Status: None   Collection Time: 10/23/22 12:17 AM   Specimen: Urine, Catheterized  Result Value Ref Range Status   Specimen Description   Final    URINE, CATHETERIZED Performed at [facility], [address] [place], [place] [address]    Special Requests   Final    NONE Performed at [facility], [address] [place], [place] [address]    Culture   Final    NO GROWTH Performed at [facility], [address], [place], [place] [address]    Report Status 10/24/2022 FINAL  Final  Aerobic/Anaerobic Culture w Gram Stain (surgical/deep wound)     Status: None   Collection Time: 10/23/22  1:03 AM   Specimen: Foot  Result Value Ref Range Status   Specimen Description   Final    FOOT LEFT Performed at [facility], [address] [place], [place] [address]    Special Requests   Final    NONE Performed at [facility], [address] [place], [place] [address]    Gram Stain   Final    RARE WBC PRESENT, PREDOMINANTLY PMN MODERATE GRAM POSITIVE COCCI RARE BUDDING YEAST SEEN    Culture   Final    RARE PROTEUS MIRABILIS ABUNDANT STAPHYLOCOCCUS AUREUS ABUNDANT STREPTOCOCCUS AGALACTIAE TESTING AGAINST S. AGALACTIAE NOT ROUTINELY PERFORMED DUE TO PREDICTABILITY OF AMP/PEN/VAN SUSCEPTIBILITY. NO ANAEROBES ISOLATED Performed at [facility], [address] [place], [place] [address]    Report Status  10/28/2022 FINAL  Final   Organism ID, Bacteria PROTEUS MIRABILIS  Final   Organism ID, Bacteria STAPHYLOCOCCUS AUREUS  Final      Susceptibility   Proteus mirabilis - MIC*    AMPICILLIN <=2 SENSITIVE Sensitive     CEFAZOLIN <=4 SENSITIVE Sensitive     CEFEPIME <=0.12 SENSITIVE Sensitive     CEFTAZIDIME <=1 SENSITIVE Sensitive     CEFTRIAXONE <=0.25 SENSITIVE Sensitive     CIPROFLOXACIN <=0.25 SENSITIVE Sensitive     GENTAMICIN >=16 RESISTANT Resistant     IMIPENEM 2 SENSITIVE Sensitive     TRIMETH/SULFA >=320 RESISTANT Resistant     AMPICILLIN/SULBACTAM <=2 SENSITIVE Sensitive     PIP/TAZO <=4 SENSITIVE Sensitive     * RARE PROTEUS MIRABILIS   Staphylococcus aureus - MIC*    CIPROFLOXACIN >=8 RESISTANT Resistant     ERYTHROMYCIN >=8 RESISTANT Resistant     GENTAMICIN <=0.5 SENSITIVE Sensitive     OXACILLIN >=4 RESISTANT Resistant  TETRACYCLINE <=1 SENSITIVE Sensitive     VANCOMYCIN <=0.5 SENSITIVE Sensitive     TRIMETH/SULFA 160 RESISTANT Resistant     CLINDAMYCIN >=8 RESISTANT Resistant     RIFAMPIN <=0.5 SENSITIVE Sensitive     Inducible Clindamycin NEGATIVE Sensitive     * ABUNDANT STAPHYLOCOCCUS AUREUS  MRSA Next Gen by PCR, Nasal     Status: None   Collection Time: 10/23/22  9:39 AM   Specimen: Nasal Mucosa; Nasal Swab  Result Value Ref Range Status   MRSA by PCR Next Gen NOT DETECTED NOT DETECTED Final    Comment: (NOTE) The GeneXpert MRSA Assay (FDA approved for NASAL specimens only), is one component of a comprehensive MRSA colonization surveillance program. It is not intended to diagnose MRSA infection nor to guide or monitor treatment for MRSA infections. Test performance is not FDA approved in patients less than 1 years old. Performed at [facility], [place] [address], [place], [place] [address]   CSF culture w Gram Stain     Status: None   Collection Time: 10/23/22  1:36 PM   Specimen: PATH Cytology CSF; Cerebrospinal Fluid  Result Value Ref Range  Status   Specimen Description CSF  Final   Special Requests NONE  Final   Gram Stain   Final    WBC PRESENT,BOTH PMN AND MONONUCLEAR NO ORGANISMS SEEN CYTOSPIN SMEAR    Culture   Final    NO GROWTH Performed at [facility], [address] [place], [place] [address]    Report Status 10/26/2022 FINAL  Final  Culture, fungus without smear     Status: None (Preliminary result)   Collection Time: 10/23/22  1:36 PM   Specimen: PATH Cytology CSF; Cerebrospinal Fluid  Result Value Ref Range Status   Specimen Description CSF  Final   Special Requests NONE  Final   Culture   Final    NO FUNGUS ISOLATED AFTER 7 DAYS Performed at [facility], [place] [address], [place], [place] [address]    Report Status PENDING  Incomplete  Anaerobic culture w Gram Stain     Status: None   Collection Time: 10/23/22  1:36 PM   Specimen: PATH Cytology CSF; Cerebrospinal Fluid  Result Value Ref Range Status   Specimen Description CSF  Final   Special Requests NONE  Final   Gram Stain   Final    WBC PRESENT,BOTH PMN AND MONONUCLEAR NO ORGANISMS SEEN CYTOSPIN SMEAR    Culture   Final    NO ANAEROBES ISOLATED Performed at [facility], [place] [address] [place], [place] [address]    Report Status 10/28/2022 FINAL  Final     Labs:   Basic Metabolic Panel: Recent Labs  Lab 10/24/22 1647 10/24/22 1647 10/25/22 0423 10/25/22 1741 10/25/22 1947 10/25/22 2224 10/26/22 0419 10/27/22 0319 10/28/22 0244 10/30/22 0603  NA  --    < > 135  --  138 140 139 135 134* 134*  K  --    < > 3.5  --  3.2* 3.2* 3.7 3.1* 3.7 4.4  CL  --    < > 104  --  103  --  103 99 97* 99  CO2  --    < > 24  --  26  --  27 27 26 27   GLUCOSE  --    < > 265*  --  118*  --  104* 126* 90 169*  BUN  --    < >  15  --  13  --  14 14 11 13   CREATININE  --    < > 1.00  --  0.92  --  1.04 0.82 0.97 1.06  CALCIUM  --    < > 8.2*  --  8.4*  --  8.4* 8.7* 8.7* 9.0  MG 1.8  --  1.6* 9.2* 2.0  --  1.7 1.6* 1.8 1.7   PHOS 3.8  --  3.1 2.5  --   --   --   --   --   --    < > = values in this interval not displayed.     CBC: Recent Labs  Lab 10/25/22 2224 10/26/22 0419 10/27/22 0319 10/28/22 0244  WBC  --  9.8 8.5 6.8  HGB 13.3 13.2 13.3 12.9*  HCT 39.0 38.3* 37.9* 37.9*  MCV  --  88.2 86.3 88.1  PLT  --  186 179 213      CBG: Recent Labs  Lab 10/30/22 0857 10/30/22 1121 10/30/22 1606 10/30/22 2103 10/31/22 0558  GLUCAP 214* 189* 208* 278* 189*      IMAGING STUDIES DG CHEST PORT 1 VIEW  Result Date: 10/26/2022 CLINICAL DATA:  Hypoxia. EXAM: PORTABLE CHEST 1 VIEW COMPARISON:  October 25, 2022 at 10:39 p.m. FINDINGS: EKG leads project over the chest. Cardiomediastinal contours and hilar structures are normal. Lungs are free from consolidation. No gross pleural effusion. Signs of vascular crowding the setting of mildly low lung volumes. Subtle increased interstitial prominence RIGHT greater than LEFT and also seen at the LEFT lung base, could be defect related to vascular crowding. On limited assessment no acute skeletal findings. IMPRESSION: Low lung volumes with vascular crowding. Mild increased interstitial prominence in the RIGHT mid and lower chest in lower chest could be related to vascular crowding effective low lung volumes. Mild pneumonitis is not excluded attention on follow-up. Electronically Signed   By: Zetta Bills M.D.   On: 10/26/2022 08:19   DG Chest Port 1 View  Result Date: 10/25/2022 CLINICAL DATA:  Acute respiratory failure EXAM: PORTABLE CHEST 1 VIEW COMPARISON:  10/24/2022 FINDINGS: The heart size and mediastinal contours are within normal limits. Both lungs are clear. The visualized skeletal structures are unremarkable. IMPRESSION: No active disease. Electronically Signed   By: Inez Catalina M.D.   On: 10/25/2022 22:49   DG FL GUIDED LUMBAR PUNCTURE  Result Date: 10/25/2022 CLINICAL DATA:  Patient admitted for status epilepticus. Diagnostic lumbar puncture requested  EXAM: DIAGNOSTIC LUMBAR PUNCTURE UNDER FLUOROSCOPIC GUIDANCE COMPARISON:  DG Fluoro Guided LP 10/03/17 FLUOROSCOPY: Radiation Exposure Index (as provided by the fluoroscopic device): 22.5 mGy Kerma PROCEDURE: Informed consent was obtained from the patient's wife prior to the procedure, including potential complications of headache, allergy, and pain. With the patient prone, the lower back was prepped with Betadine. 1% Lidocaine was used for local anesthesia. Lumbar puncture was performed at the L4/L5 level using a 5 inch 20 gauge needle with return of blood tinged CSF (that became clear after a few ml) with an opening pressure of 29 cm water. 10 ml of CSF were obtained for laboratory studies. The patient tolerated the procedure well and there were no apparent complications. IMPRESSION: Technically successful L4-L5 lumbar puncture yielding 10 mL of blood-tinged CSF that cleared after a few mL. Opening pressure of 29 cm water. Read by: Soyla Dryer, NP. Supervised and interpreted by Abigail Miyamoto, M.D. Electronically Signed   By: Abigail Miyamoto M.D.   On: 10/25/2022 07:57  Overnight EEG with video  Result Date: 10/24/2022 Lora Havens, MD     10/25/2022  9:57 AM Patient Name: [name] MRN: [id number] Epilepsy Attending: Lora Havens Referring Physician/Provider: Lorenza Chick, MD Duration: 10/23/2022 7124 to 10/24/2022 5809 Patient history: 52 year old male presented with seizures.  EEG to evaluate for seizure. Level of alertness: comatose AEDs during EEG study: Keppra, Versed, propofol Technical aspects: This EEG study was done with scalp electrodes positioned according to the 10-20 International system of electrode placement. Electrical activity was reviewed with band pass filter of 1-70Hz , sensitivity of 7 uV/mm, display speed of 56mm/sec with a 60Hz  notched filter applied as appropriate. EEG data were recorded continuously and digitally stored.  Video monitoring was available and reviewed as  appropriate. Description: At the beginning of the study, EEG showed continuous generalized 3-6 hz theta- delta slowing with overriding 12 to 15 Hz generalized beta activity.  As sedation was weaned, EEG showed posterior dominant rhythm of 8 Hz activity of moderate voltage (25-35 uV) seen predominantly in posterior head regions, symmetric and reactive to eye opening and eye closing.EEG also showed intermittent generalized 3 to 6 Hz theta-delta slowing. Hyperventilation and photic stimulation were not performed.   Event button was pressed on 10/23/2022 at 2204.  Patient was noted to be shivering.  Concomitant EEG before, during and after the event did not show any EEG changes suggest seizure. Event button was pressed on 10/24/2022 at 0611 and [phone] for opening and closing eyes.  Concomitant EEG before, during and after the event did not show any EEG changes suggest seizure. EKG artifact was seen during the study. ABNORMALITY -Continuous slow, generalized IMPRESSION: This study was initially suggestive of severe diffuse encephalopathy, nonspecific etiology but likely related to sedation.  As sedation was weaned, EEG improved and was suggestive of mild to moderate diffuse encephalopathy, nonspecific to etiology.  No seizures or epileptiform discharges were seen during the study.  Event button was pressed on 10/23/2022 at 2004 for shivering without concomitant EEG change. This was not an epileptic event. Event button was pressed on 10/24/2022 at 0611 and 0633 but opening and closing eyes without concomitant EEG change.  These were not epileptic events. Lora Havens   DG Chest Port 1 View  Result Date: 10/24/2022 CLINICAL DATA:  Intubated.  Seizure. EXAM: PORTABLE CHEST 1 VIEW COMPARISON:  Radiographs 10/23/2022 and 07/15/2017. FINDINGS: 0526 hours. Interval intubation. Tip of the endotracheal tube is at the mid tracheal level. An enteric tube has been placed, projecting below the diaphragm, with the tip not  visualized. The side-hole is below the GE junction. Persistent low lung volumes. The heart size and mediastinal contours are stable. The lungs appear clear. No evidence of pneumothorax or significant pleural effusion. The bones appear unremarkable. Telemetry leads overlie the chest. IMPRESSION: Interval intubation and enteric tube placement as described. No evidence of acute cardiopulmonary process. Electronically Signed   By: Richardean Sale M.D.   On: 10/24/2022 08:00   DG Abd 1 View  Result Date: 10/23/2022 CLINICAL DATA:  Gastric catheter placement EXAM: ABDOMEN - 1 VIEW COMPARISON:  Film from earlier in the same day. FINDINGS: Gastric catheter is again noted in the stomach. The overall appearance is stable from the prior exam. The proximal side port remains in the distal esophagus. IMPRESSION: No change in positioning of gastric catheter. Electronically Signed   By: Inez Catalina M.D.   On: 10/23/2022 23:41   DG Abd 1 View  Result Date: 10/23/2022 CLINICAL  DATA:  Check gastric catheter placement EXAM: ABDOMEN - 1 VIEW COMPARISON:  10/23/2022 FINDINGS: Gastric catheter has been advanced and now lies within the stomach. Proximal side port lies in the distal esophagus however. This could be advanced deeper into the stomach. IMPRESSION: Gastric catheter as described with the tip in the stomach although the proximal side port lies in the distal esophagus. This could be advanced deeper into the stomach. Electronically Signed   By: Inez Catalina M.D.   On: 10/23/2022 20:29   DG Abd 1 View  Result Date: 10/23/2022 CLINICAL DATA:  Orogastric tube EXAM: ABDOMEN - 1 VIEW COMPARISON:  Chest x-ray 10/23/2022 FINDINGS: Orogastric tube tip is unchanged in position in the proximal stomach just beyond the gastroesophageal junction. No dilated bowel loops are seen. IMPRESSION: Orogastric tube tip is unchanged in position in the proximal stomach just beyond the gastroesophageal junction. Recommend advancing tube 6 cm.  Electronically Signed   By: Ronney Asters M.D.   On: 10/23/2022 19:10   DG Abd 1 View  Result Date: 10/23/2022 CLINICAL DATA:  Orogastric tube EXAM: ABDOMEN - 1 VIEW COMPARISON:  None Available. FINDINGS: Orogastric tube tip is in the proximal stomach with sidehole the level of the distal esophagus. No dilated bowel loops are seen. IMPRESSION: Orogastric tube tip is in the proximal stomach with sidehole the level of the distal esophagus. Recommend advancing tube. Electronically Signed   By: Ronney Asters M.D.   On: 10/23/2022 17:49   MR CERVICAL SPINE W WO CONTRAST  Result Date: 10/23/2022 CLINICAL DATA:  MS EXAM: MRI CERVICAL SPINE WITHOUT AND WITH CONTRAST TECHNIQUE: Multiplanar and multiecho pulse sequences of the cervical spine, to include the craniocervical junction and cervicothoracic junction, were obtained without and with intravenous contrast. CONTRAST:  50mL GADAVIST GADOBUTROL 1 MMOL/ML IV SOLN COMPARISON:  09/26/17 MRI C SPine FINDINGS: Alignment: There is trace retrolisthesis of C4 on C5. Vertebrae: No fracture, evidence of discitis, or bone lesion. Cord: Normal signal and morphology. No evidence of demyelinating disease. Posterior Fossa, vertebral arteries, paraspinal tissues: Please see separately dictated MRI of the brain for intracranial findings. Partially visualized endotracheal and enteric tube in place. The right vertebral artery is small in caliber. Disc levels: C1-C2: Mild degenerative change. C2-C3: Moderate bilateral facet degenerative change. No significant disc bulge. No spinal canal stenosis. Mild right neural foraminal stenosis. C3-C4: Circumferential disc bulge. Severe bilateral facet degenerative change. Moderate spinal canal narrowing with deformation of the ventral spinal cord. Uncovertebral hypertrophy. Mild-to-moderate bilateral neural narrowing. These findings have progressed compared to 2019. C4-C5: Circumferential disc bulge. Severe bilateral facet degenerative change.  Ligamentum flavum hypertrophy/calcification. Severe spinal canal narrowing. Severe left neural foraminal stenosis. Moderate right neural foraminal stenosis. These findings have progressed compared to prior exam. C5-C6: Severe spinal canal narrowing. Ligamentum flavum hypertrophy/calcification. Severe left and moderate right facet degenerative change. Severe bilateral neural foraminal stenosis. Minimal disc bulge. C6-C7: Severe spinal canal stenosis. Ligamentum flavum hypertrophy/calcification. Moderate to severe bilateral facet degenerative change. Moderate bilateral neural foraminal narrowing. C7-T1: Osseous fusion. Mild bilateral facet degenerative change. No spinal canal or neural foraminal stenosis. IMPRESSION: 1. No evidence of demyelinating disease of the cervical spinal cord. 2. Advanced multilevel degenerative changes of the cervical spine, progressed compared to prior exam. There is now severe spinal canal stenosis at the C4, C5, and C6 vertebral body levels, predominantly due to new ligamentum flavum hypertrophy versus calcification. Recommend further evaluation with a cervical spine CT for more definitive characterization. 3. Severe neural foraminal stenosis is also  present at C4-C5 (left) and C5-C6 (bilateral). Electronically Signed   By: Marin Roberts M.D.   On: 10/23/2022 16:13   MR BRAIN W WO CONTRAST  Result Date: 10/23/2022 CLINICAL DATA:  Seizure, new EXAM: MRI HEAD WITHOUT AND WITH CONTRAST TECHNIQUE: Multiplanar, multiecho pulse sequences of the brain and surrounding structures were obtained without and with intravenous contrast. CONTRAST:  57mL GADAVIST GADOBUTROL 1 MMOL/ML IV SOLN COMPARISON:  09/03/2017 FINDINGS: Brain: The hippocampi are symmetric in size and signal, with some volume loss bilaterally. No heterotopia or evidence of cortical dysgenesis. No restricted diffusion to suggest acute or subacute infarct. No abnormal parenchymal or meningeal enhancement. No acute hemorrhage, mass,  mass effect, or midline shift. No hydrocephalus or extra-axial collection. No hemosiderin deposition to suggest remote hemorrhage. Normal pituitary and craniocervical junction. Again noted are ovoid T1 hypointense, T2 hyperintense lesions in the cerebral and cerebellar hemispheres and pons. Many of the previously noted lesions now correlate with areas of focal encephalomalacia. Confluent increased T2 signal in the periventricular white matter, which appears increased from the prior exam, with encephalomalacia in the bilateral occipital lobes (series 8, image 103), which is new from the prior exam. Vascular: Patent arterial flow voids. Normal arterial and venous enhancement. Skull and upper cervical spine: Normal marrow signal. Sinuses/Orbits: Mild mucosal thickening in the ethmoid air cells. Status post bilateral lens replacements. Other: The mastoid air cells are well aerated. Orogastric tube looped in the oropharynx. IMPRESSION: 1. No acute intracranial process.  No seizure etiology identified. 2. 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 3. Again noted are ovoid T1 hypointense, T2 hyperintense lesions in the cerebral and cerebellar hemispheres and pons, which are favored to represent demyelinating disease, although some of these may be the sequela of chronic small vessel ischemic disease. No abnormal enhancement. 4. Orogastric tube looped in the oropharynx. Repositioning is recommended. Electronically Signed   By: Merilyn Baba M.D.   On: 10/23/2022 15:59   DG Abd 1 View  Result Date: 10/23/2022 CLINICAL DATA:  OG tube EXAM: ABDOMEN - 1 VIEW COMPARISON:  Abdominal radiograph 07/15/17 FINDINGS: Enteric tube courses below diaphragm with the tip projecting over the expected location of the stomach and the side hole above the level of the GE  junction. Recommend advancement. Lung bases are clear. No dilated loops of bowel are visualized in the imaged portion of the abdomen. No acute osseous abnormality. IMPRESSION: Enteric tube courses below diaphragm with the tip projecting over the expected location of the stomach and the side hole above the level of the GE junction. Recommend advancement. Electronically Signed   By: Marin Roberts M.D.   On: 10/23/2022 11:27   CT Head Wo Contrast  Result Date: 10/23/2022 CLINICAL DATA:  Recent seizure activity with altered mental status EXAM: CT HEAD WITHOUT CONTRAST TECHNIQUE: Contiguous axial images were obtained from the base of the skull through the vertex without intravenous contrast. RADIATION DOSE REDUCTION: This exam was performed according to the departmental dose-optimization program which includes automated exposure control, adjustment of the mA and/or kV according to patient size and/or use of iterative reconstruction technique. COMPARISON:  08/20/2021 FINDINGS: Brain: No evidence of acute infarction, hemorrhage, hydrocephalus, extra-axial collection or mass lesion/mass effect. Mild atrophic changes and chronic white matter ischemic changes are noted. Vascular: No hyperdense vessel or unexpected calcification. Skull: Normal. Negative for fracture or focal lesion. Sinuses/Orbits:  No acute finding. Other: Endotracheal tube is noted. IMPRESSION: Chronic atrophic and ischemic changes.  No acute abnormality noted Electronically Signed   By: Inez Catalina M.D.   On: 10/23/2022 01:43   DG Chest Port 1 View  Result Date: 10/23/2022 CLINICAL DATA:  Status post intubation EXAM: PORTABLE CHEST 1 VIEW COMPARISON:  07/15/2017 FINDINGS: Cardiac shadow is stable. Endotracheal tube is noted 2.7 cm above the carina. Lungs are clear but hypoinflated. No bony abnormality is noted. IMPRESSION: No acute abnormality noted. Electronically Signed   By: Inez Catalina M.D.   On: 10/23/2022 00:42    DISCHARGE  EXAMINATION: Vitals:   10/30/22 2330 10/31/22 0309 10/31/22 0500 10/31/22 0752  BP: 132/84 111/71  122/83  Pulse: 83 78  (!) 103  Resp: 18 18  18   Temp: 98.6 F (37 C) 98.6 F (37 C)  98.3 F (36.8 C)  TempSrc:      SpO2: 95% 95%  100%  Weight:   103.8 kg   Height:       General appearance: Awake alert.  In no distress Resp: Clear to auscultation bilaterally.  Normal effort Cardio: S1-S2 is normal regular.  No S3-S4.  No rubs murmurs or bruit GI: Abdomen is soft.  Nontender nondistended.  Bowel sounds are present normal.  No masses organomegaly  DISPOSITION: Inpatient rehabilitation  Discharge Instructions     Ambulatory referral to Neurology   Complete by: As directed    An appointment is requested in approximately:6- 8 weeks   Call MD for:  difficulty breathing, headache or visual disturbances   Complete by: As directed    Call MD for:  extreme fatigue   Complete by: As directed    Call MD for:  persistant dizziness or light-headedness   Complete by: As directed    Call MD for:  persistant nausea and vomiting   Complete by: As directed    Call MD for:  severe uncontrolled pain   Complete by: As directed    Call MD for:  temperature >100.4   Complete by: As directed    Diet Carb Modified   Complete by: As directed    Discharge instructions   Complete by: As directed    Please review instructions on the discharge summary  You were cared for by a hospitalist during your hospital stay. If you have any questions about your discharge medications or the care you received while you were in the hospital after you are discharged, you can call the unit and asked to speak with the hospitalist on call if the hospitalist that took care of you is not available. Once you are discharged, your primary care physician will handle any further medical issues. Please note that NO REFILLS for any discharge medications will be authorized once you are discharged, as it is imperative that you  return to your primary care physician (or establish a relationship with a primary care physician if you do not have one) for your aftercare needs so that they can reassess your need for medications and monitor your lab values. If you do not have a primary care physician, you can call [phone] for a physician referral.   Discharge wound care:   Complete by: As directed    Wound care  Daily      Comments: Cleanse foot wound with saline, pat dry Cut to fit silver hydrofiber (Aquacel Ag+Kellie Simmering # [id number]) and place into wound bed, top with foam Change every over day   Increase activity  slowly   Complete by: As directed           Allergies as of 10/31/2022       Reactions   Shellfish Allergy Nausea And Vomiting   Fentanyl Itching, Swelling, Rash        Medication List     STOP taking these medications    ammonium lactate 12 % lotion Commonly known as: LAC-HYDRIN   lisinopril 10 MG tablet Commonly known as: ZESTRIL   NovoLOG FlexPen 100 UNIT/ML FlexPen Generic drug: insulin aspart   tamsulosin 0.4 MG Caps capsule Commonly known as: FLOMAX   Vitamin D (Ergocalciferol) 1.25 MG (50000 UNIT) Caps capsule Commonly known as: DRISDOL       TAKE these medications    aspirin EC 81 MG tablet Take 1 tablet (81 mg total) by mouth daily. Swallow whole.   benzonatate 100 MG capsule Commonly known as: TESSALON Take 1 capsule (100 mg total) by mouth 3 (three) times daily as needed for cough.   brimonidine 0.2 % ophthalmic solution Commonly known as: ALPHAGAN Place 1 drop into both eyes 2 (two) times daily.   finasteride 5 MG tablet Commonly known as: PROSCAR Take 5 mg by mouth daily.   FreeStyle [employer] Misc U UTD Q 10 DAYS.   insulin detemir 100 UNIT/ML FlexPen Commonly known as: LEVEMIR Inject 20 Units into the skin 2 (two) times daily. What changed:  how much to take when to take this   levETIRAcetam 500 MG tablet Commonly known as: KEPPRA Take 1  tablet (500 mg total) by mouth 2 (two) times daily.   loperamide 2 MG capsule Commonly known as: IMODIUM Take 2 capsules (4 mg total) by mouth 3 (three) times daily as needed for diarrhea or loose stools.   melatonin 3 MG Tabs tablet Take 1 tablet (3 mg total) by mouth at bedtime as needed.   saccharomyces boulardii 250 MG capsule Commonly known as: FLORASTOR Take 1 capsule (250 mg total) by mouth 2 (two) times daily for 10 days.   Teriflunomide 14 MG Tabs TAKE 1 TABLET BY MOUTH DAILY.               Discharge Care Instructions  (From admission, onward)           Start     Ordered   10/30/22 0000  Discharge wound care:       Comments: Wound care  Daily      Comments: Cleanse foot wound with saline, pat dry Cut to fit silver hydrofiber (Aquacel Ag+Kellie Simmering # [id number]) and place into wound bed, top with foam Change every over day   10/30/22 1019              Follow-up Information     Sater, Nanine Means, MD Follow up.   Specialty: Neurology Contact information: [place] [address] [phone]                 TOTAL DISCHARGE TIME: 42 minutes  [facility] Pager on [email or web address]  10/31/2022, 10:50 AM

## 2022-10-31 NOTE — TOC Transition Note (Signed)
Transition of Care [facility]) - CM/SW Discharge Note   Patient Details  Name: [name] MRN: [id number] Date of Birth: 1971/02/25  Transition of Care [facility]) CM/SW Contact:  Pollie Friar, RN Phone Number: 10/31/2022, 1:44 PM   Clinical Narrative:    Pt was denied by [facility] for a rehab admission. CM has updated MD, pt and daughter. Pt walked 300 feet so wont qualify for SNF and pt wont agree. Pt is agreeable to [facility] services. Pt with no preference for agency. CM has arranged [place] with [facility]. Information on the AVS. No new DME needs.  Pt will dc to his daughters home for a few days and then back to his home. Daughters address: [place], [place] [address] [facility] agency aware.  Pt was noncompliant with home medications prior to admission. CM talked with pt about the importance of taking his medications as prescribed. Pt voiced understanding. CM was able to get the pt a PCP appt at [facility]. Information on the AVS.  Daughter to provide transportation home.    Final next level of care: Home w Home Health Services Barriers to Discharge: No Barriers Identified   Patient Goals and CMS Choice CMS Medicare.gov Compare Post Acute Care list provided to:: Patient Choice offered to / list presented to : Patient  Discharge Placement                         Discharge Plan and Services Additional resources added to the After Visit Summary for     Discharge Planning Services: CM Consult Post Acute Care Choice: IP Rehab                    HH Arranged: PT, OT HH Agency: [place] Date [facility] Agency Contacted: 10/31/22   Representative spoke with at [place]: [place] Determinants of Health (JAARS) Interventions SDOH Screenings   Food Insecurity: No Food Insecurity (10/29/2022)  Housing: Low Risk  (10/29/2022)  Transportation Needs: No Transportation Needs (10/29/2022)  Utilities: Not At Risk (10/29/2022)  Tobacco Use: Low Risk  (10/29/2022)      Readmission Risk Interventions     No data to display

## 2022-10-31 NOTE — Progress Notes (Signed)
Occupational Therapy Treatment Patient Details Name: [name] MRN: [id number] DOB: Sep 30, 1970 Today's Date: 10/31/2022   History of present illness Patient is a 52 y/o male with PMH DM, MS, HTN and prior L foot TMA.  Presented with seizure and found to be in DKA with CBG 580.  Was intubated 1/31-2/01/24.   OT comments  Patient participated well with OT with cognition appears to be improving with patient able to give correct time and date and appeared more emotionally stable. Patient continues be impulsive but aware of balance deficits. Patient demonstrated gains with grooming, LB dressing, and functional transfers. Patient currently requires assistance following discharge for medication management and safety with ADLs and IADLs. Acute OT to continue to follow.    Recommendations for follow up therapy are one component of a multi-disciplinary discharge planning process, led by the attending physician.  Recommendations may be updated based on patient status, additional functional criteria and insurance authorization.    Follow Up Recommendations  Acute inpatient rehab (3hours/day)     Assistance Recommended at Discharge Intermittent Supervision/Assistance  Patient can return home with the following  A little help with walking and/or transfers;A little help with bathing/dressing/bathroom;Assistance with cooking/housework;Direct supervision/assist for medications management;Direct supervision/assist for financial management;Assist for transportation;Help with stairs or ramp for entrance   Equipment Recommendations  BSC/3in1    Recommendations for Other Services      Precautions / Restrictions Precautions Precautions: Fall Restrictions Weight Bearing Restrictions: No       Mobility Bed Mobility Overal bed mobility: Needs Assistance Bed Mobility: Supine to Sit     Supine to sit: Supervision     General bed mobility comments: no physical assistance    Transfers Overall  transfer level: Needs assistance Equipment used: None Transfers: Sit to/from Stand Sit to Stand: Min guard           General transfer comment: No physical assist required     Balance Overall balance assessment: Needs assistance Sitting-balance support: Feet supported Sitting balance-Leahy Scale: Good     Standing balance support: No upper extremity supported, During functional activity Standing balance-Leahy Scale: Fair Standing balance comment: supervision for static and min guard for dynamic                           ADL either performed or assessed with clinical judgement   ADL Overall ADL's : Needs assistance/impaired     Grooming: Wash/dry hands;Wash/dry face;Oral care;Supervision/safety;Standing Grooming Details (indicate cue type and reason): able to locate all items, supervision for safety             Lower Body Dressing: Min guard Lower Body Dressing Details (indicate cue type and reason): able to doff and donn socks and donn scrub pants with min guard while standing Toilet Transfer: Ambulation;Min guard             General ADL Comments: min guard to supervision for self care for safety and balance    Extremity/Trunk Assessment              Vision   Additional Comments: has glasses now   Perception     Praxis      Cognition Arousal/Alertness: Awake/alert Behavior During Therapy: Impulsive Overall Cognitive Status: Impaired/Different from baseline Area of Impairment: Safety/judgement, Awareness                   Current Attention Level: Selective   Following Commands: Follows one step commands consistently, Follows  multi-step commands consistently, Follows multi-step commands with increased time Safety/Judgement: Decreased awareness of safety   Problem Solving: Slow processing General Comments: continues to be impulsive but more aware of balance issues and safety.        Exercises      Shoulder Instructions        General Comments      Pertinent Vitals/ Pain       Pain Assessment Pain Assessment: Faces Faces Pain Scale: No hurt  Home Living                                          Prior Functioning/Environment              Frequency  Min 2X/week        Progress Toward Goals  OT Goals(current goals can now be found in the care plan section)  Progress towards OT goals: Progressing toward goals  Acute Rehab OT Goals Patient Stated Goal: get better OT Goal Formulation: With patient Time For Goal Achievement: 11/08/22 Potential to Achieve Goals: Good ADL Goals Pt Will Perform Lower Body Bathing: with set-up;with supervision;sit to/from stand Pt Will Perform Lower Body Dressing: with set-up;with supervision;sit to/from stand Pt Will Transfer to Toilet: with supervision;ambulating Pt Will Perform Toileting - Clothing Manipulation and hygiene: with set-up;with supervision;sit to/from stand Additional ADL Goal #1: Pt will demosntrate on-line awareness during ADL tasks in minimally distracting environments  Plan Discharge plan remains appropriate    Co-evaluation                 AM-PAC OT "6 Clicks" Daily Activity     Outcome Measure   Help from another person eating meals?: None Help from another person taking care of personal grooming?: A Little Help from another person toileting, which includes using toliet, bedpan, or urinal?: A Little Help from another person bathing (including washing, rinsing, drying)?: A Little Help from another person to put on and taking off regular upper body clothing?: A Little Help from another person to put on and taking off regular lower body clothing?: A Little 6 Click Score: 19    End of Session Equipment Utilized During Treatment: Gait belt  OT Visit Diagnosis: Unsteadiness on feet (R26.81);Other abnormalities of gait and mobility (R26.89);Muscle weakness (generalized) (M62.81);History of falling (Z91.81);Other  symptoms and signs involving cognitive function;Low vision, both eyes (H54.2)   Activity Tolerance Patient tolerated treatment well   Patient Left in chair;with call bell/phone within reach;with chair alarm set   Nurse Communication Mobility status        Time: 2993-7169 OT Time Calculation (min): 27 min  Charges: OT General Charges $OT Visit: 1 Visit OT Treatments $Self Care/Home Management : 23-37 mins  Lodema Hong, [place]  Office [place] 10/31/2022, 9:13 AM

## 2022-11-02 ENCOUNTER — Other Ambulatory Visit: Payer: Self-pay | Admitting: Internal Medicine

## 2022-11-02 MED ORDER — LEVETIRACETAM 500 MG PO TABS: 500.0000 mg | ORAL_TABLET | Freq: Two times a day (BID) | ORAL | 2 refills | Status: AC

## 2022-11-02 NOTE — Progress Notes (Signed)
Patient called hospital - has been discharged for >48h and is now calling that his keppra Rx cannot be found and has not yet been filled. We discussed the high risk of remaining off his medications and a new Rx was faxed to his pharmacy.

## 2022-11-07 ENCOUNTER — Ambulatory Visit: Payer: Self-pay | Admitting: Internal Medicine

## 2022-11-08 MED FILL — Fentanyl Citrate Preservative Free (PF) Inj 100 MCG/2ML: INTRAMUSCULAR | Qty: 1 | Status: AC

## 2022-11-13 LAB — CULTURE, FUNGUS WITHOUT SMEAR

## 2023-01-01 ENCOUNTER — Ambulatory Visit: Payer: BLUE CROSS/BLUE SHIELD | Admitting: Family Medicine

## 2024-07-30 ENCOUNTER — Other Ambulatory Visit: Payer: Self-pay

## 2024-07-30 ENCOUNTER — Emergency Department
Admission: EM | Admit: 2024-07-30 | Discharge: 2024-07-31 | Disposition: A | Discharge status: Home / Self Care | Payer: Self-pay | Attending: Emergency Medicine | Admitting: Emergency Medicine

## 2024-07-30 DIAGNOSIS — I10 Essential (primary) hypertension: Secondary | ICD-10-CM | POA: Insufficient documentation

## 2024-07-30 DIAGNOSIS — E1165 Type 2 diabetes mellitus with hyperglycemia: Secondary | ICD-10-CM | POA: Insufficient documentation

## 2024-07-30 DIAGNOSIS — R739 Hyperglycemia, unspecified: Secondary | ICD-10-CM

## 2024-07-30 DIAGNOSIS — R040 Epistaxis: Secondary | ICD-10-CM | POA: Insufficient documentation

## 2024-07-30 LAB — CBC WITH DIFFERENTIAL/PLATELET
Abs Immature Granulocytes: 0.04 K/uL (ref 0.00–0.07)
Basophils Absolute: 0 K/uL (ref 0.0–0.1)
Basophils Relative: 1 %
Eosinophils Absolute: 0.1 K/uL (ref 0.0–0.5)
Eosinophils Relative: 1 %
HCT: 43.9 % (ref 39.0–52.0)
Hemoglobin: 15.5 g/dL (ref 13.0–17.0)
Immature Granulocytes: 1 %
Lymphocytes Relative: 23 %
Lymphs Abs: 1.6 K/uL (ref 0.7–4.0)
MCH: 31.7 pg (ref 26.0–34.0)
MCHC: 35.3 g/dL (ref 30.0–36.0)
MCV: 89.8 fL (ref 80.0–100.0)
Monocytes Absolute: 0.4 K/uL (ref 0.1–1.0)
Monocytes Relative: 6 %
Neutro Abs: 4.9 K/uL (ref 1.7–7.7)
Neutrophils Relative %: 68 %
Platelets: 291 K/uL (ref 150–400)
RBC: 4.89 MIL/uL (ref 4.22–5.81)
RDW: 12.5 % (ref 11.5–15.5)
WBC: 7.1 K/uL (ref 4.0–10.5)
nRBC: 0 % (ref 0.0–0.2)

## 2024-07-30 LAB — COMPREHENSIVE METABOLIC PANEL WITH GFR
ALT: 16 U/L (ref 0–44)
AST: 23 U/L (ref 15–41)
Albumin: 3.7 g/dL (ref 3.5–5.0)
Alkaline Phosphatase: 102 U/L (ref 38–126)
Anion gap: 13 (ref 5–15)
BUN: 15 mg/dL (ref 6–20)
CO2: 21 mmol/L — ABNORMAL LOW (ref 22–32)
Calcium: 9.1 mg/dL (ref 8.9–10.3)
Chloride: 99 mmol/L (ref 98–111)
Creatinine, Ser: 1.45 mg/dL — ABNORMAL HIGH (ref 0.61–1.24)
GFR, Estimated: 57 mL/min — ABNORMAL LOW (ref >60)
Glucose, Bld: 520 mg/dL (ref 70–99)
Potassium: 3.8 mmol/L (ref 3.5–5.1)
Sodium: 133 mmol/L — ABNORMAL LOW (ref 135–145)
Total Bilirubin: 0.8 mg/dL (ref 0.0–1.2)
Total Protein: 7.9 g/dL (ref 6.5–8.1)

## 2024-07-30 MED ORDER — INSULIN ASPART 100 UNIT/ML IJ SOLN
8.0000 [IU] | Freq: Once | INTRAMUSCULAR | Status: AC
  Administered 2024-07-30: 8 [IU] via INTRAVENOUS
  Filled 2024-07-30: qty 8

## 2024-07-30 MED ORDER — SODIUM CHLORIDE 0.9 % IV BOLUS
1000.0000 mL | Freq: Once | INTRAVENOUS | Status: AC
  Administered 2024-07-30: 1000 mL via INTRAVENOUS

## 2024-07-30 NOTE — ED Notes (Signed)
 Patient is aware that he should take antihypertensives; states he hasn't had any in 3 years.

## 2024-07-30 NOTE — ED Triage Notes (Signed)
 Patient to ED via [facility] for nosebleed to right nare; bleeding controlled after EMS administered Afrin, pressure and ice.

## 2024-07-30 NOTE — ED Notes (Signed)
 Glucose 520 and MD Siadecki informed acuity changed

## 2024-07-30 NOTE — ED Provider Notes (Signed)
 [facility] Provider Note    Event Date/Time   First MD Initiated Contact with Patient 07/30/24 2119     (approximate)   History   Epistaxis   HPI  [name] is a 53 y.o. male with a history of type II diabetes, seizure disorder, MS, and hypertension who presents with epistaxis, acute onset this evening, lasting about an hour, now resolved after EMS administered Afrin and pressure.  It was mainly coming from his right nare.  He denies any prior history of nosebleed.  The patient denies feeling dizzy or lightheaded.  He has not noted any other abnormal bleeding or bruising.  He did note that his blood pressure has been somewhat elevated.   I reviewed the past medical records.  The patient's most recent prior encounter in our system was an admission to the ICU and subsequently the hospitalist service at [facility] in February 2024 with status epilepticus.   Physical Exam   Triage Vital Signs: ED Triage Vitals  Encounter Vitals Group     BP 07/30/24 1801 (!) 143/99     Girls Systolic BP Percentile --      Girls Diastolic BP Percentile --      Boys Systolic BP Percentile --      Boys Diastolic BP Percentile --      Pulse Rate 07/30/24 1801 (!) 113     Resp 07/30/24 1801 20     Temp 07/30/24 1801 98.4 F (36.9 C)     Temp Source 07/30/24 1801 Oral     SpO2 07/30/24 1801 100 %     Weight 07/30/24 1759 265 lb (120.2 kg)     Height 07/30/24 1759 5' 11 (1.803 m)     Head Circumference --      Peak Flow --      Pain Score 07/30/24 1758 0     Pain Loc --      Pain Education --      Exclude from Growth Chart --     Most recent vital signs: Vitals:   07/30/24 1801 07/30/24 2210  BP: (!) 143/99 (!) 150/100  Pulse: (!) 113 93  Resp: 20 18  Temp: 98.4 F (36.9 C) 98.3 F (36.8 C)  SpO2: 100% 100%     General: Awake, no distress.  CV:  Good peripheral perfusion.  Resp:  Normal effort.  Abd:  No distention.  Other:  Right nare with stigmata  of recent bleeding.  No active bleeding.   ED Results / Procedures / Treatments   Labs (all labs ordered are listed, but only abnormal results are displayed) Labs Reviewed  COMPREHENSIVE METABOLIC PANEL WITH GFR - Abnormal; Notable for the following components:      Result Value   Sodium 133 (*)    CO2 21 (*)    Glucose, Bld 520 (*)    Creatinine, Ser 1.45 (*)    GFR, Estimated 57 (*)    All other components within normal limits  CBC WITH DIFFERENTIAL/PLATELET     EKG   RADIOLOGY   PROCEDURES:   Critical Care performed: No  Procedures   MEDICATIONS ORDERED IN ED: Medications  sodium chloride  0.9 % bolus 1,000 mL (1,000 mLs Intravenous New Bag/Given 07/30/24 2237)  insulin  aspart (novoLOG ) injection 8 Units (8 Units Intravenous Given 07/30/24 2239)  sodium chloride  0.9 % bolus 1,000 mL (1,000 mLs Intravenous New Bag/Given 07/30/24 2237)     IMPRESSION / MDM / ASSESSMENT AND PLAN /  ED COURSE  I reviewed the triage vital signs and the nursing notes.  53 year old male with PMH as noted above presents with anterior epistaxis that has now resolved.  CMP and CBC were obtained from triage and reveal hyperglycemia with a normal anion gap.  He reports that he did not take his insulin  this evening and just ate a meal prior to coming to the ED.  Differential diagnosis includes, but is not limited to, anterior epistaxis, complicated hyperglycemia, less likely HHS.  There is no evidence of DKA.  The patient is well-appearing and has no acute symptoms.  We will give fluids and insulin  and reassess.  Patient's presentation is most consistent with acute complicated illness / injury requiring diagnostic workup.  ----------------------------------------- 11:11 PM on 07/30/2024 -----------------------------------------  Patient is receiving fluids and insulin  and pending repeat glucose check.  I have signed him out to the oncoming ED physician Dr. Waymond.    FINAL CLINICAL  IMPRESSION(S) / ED DIAGNOSES   Final diagnoses:  Anterior epistaxis  Hyperglycemia     Rx / DC Orders   ED Discharge Orders     None        Note:  This document was prepared using Dragon voice recognition software and may include unintentional dictation errors.    Jacolyn Pae, MD 07/30/24 2312

## 2024-07-31 LAB — CBG MONITORING, ED: Glucose-Capillary: 323 mg/dL — ABNORMAL HIGH (ref 70–99)

## 2024-07-31 NOTE — Discharge Instructions (Signed)
 Please take your medications as prescribed for your diabetes.

## 2024-07-31 NOTE — ED Provider Notes (Signed)
.-----------------------------------------   12:47 AM on 07/31/2024 -----------------------------------------  Blood pressure (!) 150/100, pulse 93, temperature 98.3 F (36.8 C), temperature source Oral, resp. rate 18, height 5' 11 (1.803 m), weight 120.2 kg, SpO2 100%.  Assuming care from Dr. Jacolyn.  In short, [name] is a 53 y.o. male with a chief complaint of Epistaxis .  Refer to the original H&P for additional details.  The current plan of care is to follow-up blood glucose after insulin  and fluids.  On reassessment glucose is downtrending, on reassessment patient is asymptomatic at this time, no repeated episode of epistaxis.  Considered but no indication for inpatient admission at this time, he safe for outpatient management.  Initial labs were not consistent with DKA.  Did instruct him to follow-up with primary care doctor early next week to get reassessed.  And to take his insulin  as prescribed.  Shared decision making done with patient and family and they are agreeable with this plan.  Discharged with strict return precautions.         Waymond Lorelle Cummins, MD 07/31/24 [phone]
